# Patient Record
Sex: Male | Born: 1950 | Race: White | Hispanic: No | Marital: Single
Health system: Midwestern US, Community
[De-identification: ages and names within clinical notes are randomized; demographics above are authoritative.]

## PROBLEM LIST (undated history)

## (undated) DIAGNOSIS — K7460 Unspecified cirrhosis of liver: Secondary | ICD-10-CM

## (undated) DIAGNOSIS — K7031 Alcoholic cirrhosis of liver with ascites: Principal | ICD-10-CM

## (undated) DIAGNOSIS — K2960 Other gastritis without bleeding: Secondary | ICD-10-CM

## (undated) DIAGNOSIS — B182 Chronic viral hepatitis C: Secondary | ICD-10-CM

## (undated) DIAGNOSIS — K7030 Alcoholic cirrhosis of liver without ascites: Secondary | ICD-10-CM

## (undated) MED ORDER — MULTIVITAMIN CAP
ORAL_CAPSULE | Freq: Every day | ORAL | Status: DC
Start: ? — End: 2014-09-18

## (undated) MED ORDER — SPIRONOLACTONE 100 MG TAB
100 mg | ORAL_TABLET | Freq: Every day | ORAL | Status: DC
Start: ? — End: 2013-01-22

## (undated) MED ORDER — OFLOXACIN 400 MG TAB
400 mg | ORAL_TABLET | Freq: Two times a day (BID) | ORAL | Status: AC
Start: ? — End: 2013-02-16

## (undated) MED ORDER — SPIRONOLACTONE 100 MG TAB
100 mg | ORAL_TABLET | Freq: Every day | ORAL | Status: DC
Start: ? — End: 2014-05-04

## (undated) MED ORDER — LEVOFLOXACIN 750 MG TAB
750 mg | ORAL_TABLET | Freq: Every day | ORAL | Status: DC
Start: ? — End: 2013-01-19

## (undated) MED ORDER — OXYCODONE 10 MG TAB
10 mg | ORAL_TABLET | ORAL | Status: DC | PRN
Start: ? — End: 2013-03-11

## (undated) MED ORDER — FOLIC ACID 1 MG TAB
1 mg | ORAL_TABLET | Freq: Every day | ORAL | Status: DC
Start: ? — End: 2013-01-22

## (undated) MED ORDER — THIAMINE HCL 100 MG TAB
100 mg | ORAL_TABLET | Freq: Every day | ORAL | Status: DC
Start: ? — End: 2014-05-20

## (undated) MED ORDER — OXYCODONE 10 MG TAB
10 mg | ORAL_TABLET | Freq: Four times a day (QID) | ORAL | Status: DC | PRN
Start: ? — End: 2013-02-08

## (undated) MED ORDER — NADOLOL 20 MG TAB
20 mg | ORAL_TABLET | Freq: Every day | ORAL | Status: DC
Start: ? — End: 2013-01-22

## (undated) MED ORDER — OXYCODONE-ACETAMINOPHEN 5 MG-325 MG TAB
5-325 mg | ORAL_TABLET | Freq: Three times a day (TID) | ORAL | Status: DC | PRN
Start: ? — End: 2013-03-11

## (undated) MED ORDER — OXYCODONE-ACETAMINOPHEN 5 MG-325 MG TAB
5-325 mg | ORAL_TABLET | ORAL | Status: DC | PRN
Start: ? — End: 2013-03-11

## (undated) MED ORDER — DOCUSATE SODIUM 100 MG CAP
100 mg | ORAL_CAPSULE | Freq: Two times a day (BID) | ORAL | Status: AC
Start: ? — End: 2013-04-22

## (undated) MED ORDER — NADOLOL 20 MG TAB
20 mg | ORAL_TABLET | Freq: Every day | ORAL | Status: DC
Start: ? — End: 2014-05-04

## (undated) MED ORDER — OTHER
Status: DC
Start: ? — End: 2013-03-18

## (undated) MED ORDER — THIAMINE HCL 100 MG TAB
100 mg | ORAL_TABLET | Freq: Every day | ORAL | Status: DC
Start: ? — End: 2013-01-22

## (undated) MED ORDER — DOCUSATE SODIUM 100 MG CAP
100 mg | ORAL_CAPSULE | Freq: Two times a day (BID) | ORAL | Status: DC
Start: ? — End: 2013-01-22

## (undated) MED ORDER — FUROSEMIDE 40 MG TAB
40 mg | ORAL_TABLET | Freq: Every day | ORAL | Status: AC
Start: ? — End: 2013-02-28

## (undated) MED ORDER — OTHER
Status: DC
Start: ? — End: 2013-02-08

## (undated) MED ORDER — PANTOPRAZOLE 40 MG TAB, DELAYED RELEASE
40 mg | ORAL_TABLET | Freq: Every day | ORAL | Status: DC
Start: ? — End: 2014-05-04

## (undated) MED ORDER — TRAMADOL 50 MG TAB
50 mg | ORAL_TABLET | Freq: Four times a day (QID) | ORAL | Status: DC | PRN
Start: ? — End: 2012-11-08

## (undated) MED ORDER — ONDANSETRON HCL 4 MG TAB
4 mg | ORAL_TABLET | Freq: Three times a day (TID) | ORAL | Status: DC | PRN
Start: ? — End: 2013-03-25

## (undated) MED ORDER — FOLIC ACID 1 MG TAB
1 mg | ORAL_TABLET | Freq: Every day | ORAL | Status: DC
Start: ? — End: 2014-05-20

## (undated) MED ORDER — IBUPROFEN 800 MG TAB
800 mg | ORAL_TABLET | Freq: Three times a day (TID) | ORAL | Status: AC | PRN
Start: ? — End: 2012-12-26

## (undated) MED ORDER — MULTIVITAMIN CAP
ORAL_CAPSULE | Freq: Every day | ORAL | Status: DC
Start: ? — End: 2013-01-22

## (undated) MED ORDER — LEVOFLOXACIN 500 MG TAB
500 mg | ORAL_TABLET | Freq: Every day | ORAL | Status: AC
Start: ? — End: 2013-01-26

## (undated) MED ORDER — FERROUS SULFATE 325 MG (65 MG ELEMENTAL IRON) TAB
325 mg (65 mg iron) | ORAL_TABLET | Freq: Every day | ORAL | Status: DC
Start: ? — End: 2014-05-20

---

## 2004-10-22 NOTE — H&P (Signed)
[facility]                              HISTORY AND PHYSICAL                             Thomes Dinning, M.D.   NAME:    [name], [name]   MR #:    [id number]                    ADM DATE:        10/22/2004   BILLING  [id number]                   PT. LOCATION     ER  [id number]   #:   SS #     [id number]   Thomes Dinning, M.D.   cc:    Thomes Dinning, M.D.   CHIEF COMPLAINT:  Vomiting blood.   The informant is the patient.  He is a poor historian.  He has received   sedation.  The majority of the history is obtained from the ER personnel   and records.   HISTORY OF PRESENT ILLNESS:  This is a 54 year old white male who presents   to the hospital with a 24-hour history of dizziness.  On the day of   admission he vomited bright red blood.  There was a history of dark bowel   movements for 1 week and this was associated with some upper abdominal   pain.  The patient states to me that he drinks 6-12 beers a day.  There is   also a history of a fall off a ladder one week ago striking the left ribs   and hurting the left knee.  At this time he does not admit to any pain,   headache, shortness of breath, cough or abdominal pain.   REVIEW OF SYSTEMS:  Severely limited by the patient's lack of ability to   give a good history.   GENERAL:  No fevers, chills or weight change.   HEAD: No headache or lightheadedness.   RESPIRATORY:   No shortness of breath, cough or wheezing.   CHEST:  No chest pain, chest pressure, or palpitations.   GASTROINTESTINAL:   No nausea at this point, had no abdominal pain, denies   any known history of hepatic disease or ulcers.   GENITOURINARY:  No hematuria or change in urine color.   MUSCULOSKELETAL:   Left ribs hurt and stated something about his left knee   hurting, but will not elaborate.   NEUROLOGICAL:  No history of delirium tremens, no paresthesias, no   seizures.   No other history is obtainable from this patient.   PAST MEDICAL HISTORY:  Denied.    MEDICATIONS:  Denied.   ALLERGIES:  Denied.   SOCIAL HISTORY: States he does not smoke cigarettes, does drink alcohol as   stated above.   PHYSICAL EXAMINATION:   VITAL SIGNS:  Blood pressure 138/74 pulse 117, respirations 20, temperature   98.4, oxygen saturation 97%.   GENERAL:   He is well-developed, slightly overweight, no acute distress.   HEENT:   Head:  Normal hair pattern appreciated.  Eyes:  He does not appear   to be icteric.  Conjunctivae are not pale.  Pupils are 3 mm and react to   light.  Does not follow  for range of motion or fundus examination.  Ears:   Externally normal, hearing grossly normal.  Orally:  Tongue appears to be   normal.   NECK:  Supple, no masses, no jugular venous distention, no thyromegaly.   PULMONARY:  Lungs are clear.  The patient will cooperate and take deep   breaths.   CARDIOVASCULAR:  Tachycardia, S1, S2 within normal limits, no murmur, rub,   or gallop appreciated, no neck bruits.   ABDOMEN:  Some bowel sounds are present and appear to be normal, no pain to   palpation at this time.  Rectal done by ER personnel is strongly guaiac   positive melena.   MUSCULOSKELETAL:   No edema, no cyanosis, no significant deformities.   Unable to adequately evaluate the strength.   NEUROLOGICAL:  Not oriented, not able to give an adequate history.   Reflexes are 1+/4+, equal bilaterally, able to move all extremities.   INTEGUMENTARY:  No skin lesions except for some spider angioma on the   shoulders.   DIAGNOSTIC STUDIES: Sodium 136, potassium 4.1, chloride 100, CO2 24,   glucose 190, BUN 27, creatinine 0.9, SGOT 123, SGPT 120, alkaline   phosphatase 96, total bilirubin 1.3, calcium 8.6, total protein 6.7,   albumin 3, lipase is 24, white count is 12,100, hemoglobin is 12.6,   platelet count 114,000, segmented neutrophils 81, lymphs 11, monocytes 8.   INR is 1.3, alcohol negative.   IMPRESSION AND PLAN:      1. Upper gastrointestinal bleeding.  Gastroenterology has been       consulted.  The patient will be placed in ICU.  He is being resuscitated      with fluids, vitamins, magnesium.  He has been typed and crossed with 2      units of packed red blood cells and we will follow with serial      hemoglobin and hematocrit.  He has been started on Protonix drip and      octreotide for DT prophylaxis.  Critical care time includin eval,      review, orders; 41 minutes.      2. Elevated glucose - a new finding.  Check hemoglobin A1C, fasting      lipid panel.      3. Possible delirium tremens, will give prophylaxis.      4. Status post fall - left side of ribs chest x-ray.   Electronically Signed By:   Thomes Dinning, M.D. 11/03/2004 06:40   ____________________________   Thomes Dinning, M.D.   rew  D:  10/22/2004  T:  10/22/2004 11:00 P   [id number]

## 2004-10-22 NOTE — ED Provider Notes (Signed)
ADMISSION                           [facility]                      EMERGENCY DEPARTMENT TREATMENT REPORT   NAME:  RAYCE, BRAHMBHATT                   PT. LOCATION:     ER  [phone]   MR #:         BILLING #: [id number]          DOS: 10/22/2004   TIME: 7:43 P   [id number]   cc:   Primary Physician:   None.   CHIEF COMPLAINT:  Dizziness and vomiting blood.   HISTORY OF PRESENT ILLNESS:  This patient is a 54 year old male who drinks   fairly regularly.  He states for the last 24 hours he has been feeling   incredibly dizzy and lightheaded.  Today he began vomiting bright red   blood.  He also states he has been having dark bowel movements and some   upper abdominal pain for the last week.  He denies any history of GI   bleeds.  He drinks regularly, but is fairly unwilling to quantify it.  He   states it is not daily, and it sounds like he does binge drinking. He has   not used any drugs in over a year.  He did fall off a ladder a week ago.   He states at that time he did not hit his head nor did he lose   consciousness.  He has some bruising on his left lower ribs in the   midaxillary line and posteriorly from it.  He has no complaints of   shortness of breath or difficulty breathing. He does use aspirin and Motrin   intermittently for aches and pains and used more of this week since his   fall.   REVIEW OF SYSTEMS: CONSTITUTIONAL:  No fever, chills, weight loss.   ENDOCRINE:  Not a diabetic.   HEMATOLOGIC:  No excessive bruising.  RESPIRATORY:  No cough, shortness of   breath, or wheezing.   CARDIOVASCULAR:  No chest pain, chest pressure, or palpitations.   GASTROINTESTINAL:  See HPI.   GENITOURINARY:  No hematuria.   MUSCULOSKELETAL:  Left-sided rib pain since a fall a week ago.   SKIN:  Bruising over his ribcage.   NEUROLOGICAL:  No headache.  No sensory or motor symptoms.   Denies complaints in any other system.   PAST MEDICAL HISTORY:  Fairly regular alcohol use.   MEDICATIONS:  None.    ALLERGIES:  No known drug allergies.   SOCIAL HISTORY:  Denies tobacco abuse.  See HPI.   PHYSICAL EXAMINATION:   VITAL SIGNS:  Blood pressure 132/67, pulse 100, respiratory rate 20,   temperature 98.4.  Pulse oximetry is 100% on room air.   GENERAL APPEARANCE:  Somewhat disheveled-appearing male lying in the bed in   no acute distress.  He is awake, alert, and giving his history.   HEENT:  Eyes:  Conjunctivae clear, lids normal.  Pupils equal, symmetrical,   and normally reactive.  Conjunctivae is reasonably well-injected and   definitely not pale.  Mouth/Throat:  Surfaces of the pharynx, palate, and   tongue are pink, moist, and without lesions.   NECK:  Supple, nontender, symmetrical.  No cervical or  submandibular   lymphadenopathy palpated.   RESPIRATORY:  Clear and equal breath sounds.  No respiratory distress,   tachypnea, or accessory muscle use.   CARDIOVASCULAR:  Regular rate and rhythm.  Femoral pulses 2+ and equal   bilaterally.  No peripheral edema.  Calves are soft and nontender.  CHEST:   Tender to palpation over the inferior ribs on the left.  No palpable   crepitus.  There is an overlying bruise.   GI:  Abdomen soft, nontender, without complaint of pain to palpation.  No   hepatomegaly or splenomegaly.  No abdominal or inguinal masses appreciated   by inspection or palpation.   RECTAL:  Sphincter tone is normal.  No obvious hemorrhoids.  There is gross   melena on digital examination that is guaiac-positive.   SKIN:  Warm and dry.   NEUROLOGIC:  Alert and oriented x 3.  GCS (Glasgow Coma Score) is 15.   Motor and sensory examination normal and symmetrical throughout.   INITIAL ASSESSMENT AND MANAGEMENT PLAN:  This is a 54 year old male with   presents with obvious GI bleeding.  He has been vomiting blood.  He is   having melena and he is incredibly dizzy.  We will go ahead and check an   i-STAT to evaluate his hemoglobin as well as electrolytes.  We will place    large-bore IVs and begin hydrating him.  Will start Protonix and place an   NG tube to evaluate for ongoing upper GI bleeding.  In the meantime, we   will evaluate his liver functions, INR, and basic lab work.  There are no   records on him in our computer.   CONTINUATION BY DR. Kirt Boys MATHEWS:   DIAGNOSTIC STUDIES:  CBC was normal except for white count of 12,000,   hemoglobin 10.6, hematocrit 30%, platelets 114,000.  Comprehensive   Metabolic Panel was normal except for glucose of 190, BUN 27, SGOT 123,   SGPT 120, total bilirubin 1.3, albumin level 3.0.  INR was elevated at 1.3.   Alcohol level 0.  Lipase was normal.  The patient has been typed and   crossed for 2 units of packed red blood cells.   COURSE IN THE EMERGENCY DEPARTMENT:   The patient had 2 large-bore IVs   placed and has normal saline hanging.  He had an NG tube placed which had   essentially no output.  He was started on Protonix drip. He was given   Ativan IV prior to NG tube placement so that he would tolerate it better.   He is currently being hydrated with saline.   CONSULTATION:  I spoke with Dr. Marilynn Rail, he will consult on the patient.   I spoke with Dr. Bertram Gala, who will admit.   FINAL DIAGNOSES:   1.  Acute gastrointestinal bleeding.   2.  Dizziness secondary to #1.   3.  Hematemesis.   4.  Alcohol abuse.   5.  Hyperglycemia.   Critical care time, excluding procedures, but including direct patient   care, reviewing medical records, evaluating results of diagnostic testing,   discussions with family members, and consulting with physicians: 50   minutes.   DISPOSITION/PLAN: The patient is being admitted to the Intensive Care Unit   under the care of the hospitalist's service for further evaluation and   management.  He is currently in stable condition.   Electronically Signed By:   Stephan Minister, M.D. 11/08/2004 14:28   ____________________________   Stephan Minister, M.D.  [id number]  D:  10/22/2004  T:  10/22/2004  8:26 P   [id number]

## 2004-10-23 NOTE — Procedures (Signed)
[facility]                                 PROCEDURE NOTE                            [name], M.D.   [facility] [place], [place]   E:   MR  [id number]                         DATE:            10/23/2004   #:   [name]  [id number]                      PT. LOCATION:    [id number]   #   [name], M.D.   cc:    Thomes Dinning, M.D.          [name], M.D.   Extra copies to office:  0   PROCEDURE:   Esophagogastroduodenoscopy.   INDICATIONS FOR PROCEDURE:   Upper GI bleeding, nonsteroidal and aspirin use, possible cirrhosis.  ASA   score prior to the procedure is 2.   OPERATOR:   Dr. Marilynn Rail   MEDICATION:   Per anesthesia (MAC).   FINDINGS:  After informed consent was obtained from the patient, the   patient was placed in the left lateral decubitus position and premedicated   by anesthesia using Diprivan.  After the patient was adequately sedated, a   GIF-160 video endoscope was gently inserted into the upper esophagus under   direct vision.  The esophagus was endoscopically normal until the distal   aspect was reached. There were several small F1 varices noted in the distal   esophagus. The gastroesophageal junction was located at 40 cm from the   incisors.   I then passed the endoscope into the stomach where a probable Mallory-Weiss   tear was identified on the lesser curvature side of the gastric   cardia/fundus.  There was an overlying clot of this tear.  There was no   active bleeding from the site.  In the fundus, there were submucosal   hemorrhages, consistent with nonsteroidal fundic gastropathy.  The then   passed the endoscope into the distal stomach where the mucosa appeared   normal.  The gastric pylorus was traversed, and the endoscope was passed   into a duodenal bulb, which was angulated.  I was able to introduce the   endoscope into the descending duodenum, which appeared endoscopically    normal.  Slow withdrawal of the endoscope through the proximal duodenum   revealed no mucosal ulceration.    The endoscope was then slowly withdrawn   from the patient, and the patient appeared to tolerate the procedure well.   IMPRESSIONS:      1. Mallory-Weiss tear (fundus/cardia)- overlying clot with no active      bleeding.      2. F1 esophageal varices.      3. Nonsteroidal-induced gastropathy.   PLAN:      1. Continue Protonix drip.      2. Wean Octreotide.      3. We will discuss findings with the patient's hospitalist.   Electronically Signed By:   [name], M.D. 11/01/2004 09:58   _________________________________   [name], M.D.  ndt  D:  10/23/2004  T:  10/23/2004  1:53 P   [id number]

## 2004-10-23 NOTE — Consults (Signed)
[facility]                               CONSULTATION REPORT                     CONSULTANT:  Yisroel Ramming, M.D.   [facility], [facility]   BILLING #:  [id number]             DATE OF CONSULT:     10/23/2004   MR #:       [id number]              ADM DATE:            10/22/2004   SS #        [id number]           PT. LOCATION:        [id number]   ATTENDING:  Thomes Dinning, M.D.   cc:    Thomes Dinning, M.D.          Yisroel Ramming, M.D.   REASON FOR CONSULTATION:  GI bleeding.   IMPRESSION:      1. Upper gastrointestinal bleeding- differential diagnoses would include      bleeding from peptic ulcer disease, esophageal or gastric varices or      Mallory-Weiss tear.      2. Alcohol withdrawal (with concurrent agitation).      3. ? alcoholic cirrhosis (no physical stigmata, however).      4. Hematuria secondary to Foley trauma.      5. Chronic alcohol abuse history.   PLAN:      1. Continue supportive care.      2. Vitamin K.      3. Continue IV Protonix drip.      4. Continue IV octreotide 25 mcg/hr. There is an option to increase the      drip to 50 mcg/hr if the patient develops active GI bleeding.      5. Serial labs, including hemoglobin and hematocrit.      6. Avoid Tylenol.      7. EGD with anesthesia, MAC sedation.  This will be performed either      today or tomorrow.  The patient is currently not actively bleeding.   HISTORY OF PRESENT ILLNESS:  A 54 year old Caucasian male who presented to   the emergency room at [facility] for evaluation of   hematemesis.  According to the patient he was currently agitated and just   removed his Foley catheter resulting in marked hematuria.  The patient has   had possible melena for 1 week.  He denies a prior history of GI bleeding.   He does have risk factors for peptic ulcer disease (aspirin and Motrin   use).  He admits to drinking alcohol chronically and in large amounts.  He    may have binge drinking like behavior.  He denies a prior history of   hepatitis, jaundice, or family history of liver disease.  He has multiple   tattoos and does not recall being tested for hepatitis in the past.   I spoke to the ICU nurse this morning who was taking care of this patient.   The patient apparently removed his Foley catheter and this resulted in   marked hematuria.  He has had mild agitation.  Apparently, the patient's   stool overnight  had been brown.  NG tube has been draining tan fluid   without blood.  Laboratory data is significant for moderate   hypertransaminasemia and diminished synthetic function with an albumin of   2.9, and a cholesterol of 117.   PAST MEDICAL HISTORY:  None.   ALLERGIES:  No known drug allergies.   MEDICATIONS AT HOME:  None.   FAMILY HISTORY: There is no family history of liver disease, colon polyps,   or colon cancer.   SOCIAL HISTORY:  The patient smokes cigars and drinks alcohol heavily in a   binge pattern.   REVIEW OF SYSTEMS:  Notable for painful hematuria and limited upper GI   bleeding manifested by hematemesis.  He denies abdominal pain.  He also   denies dysphagia, odynophagia, or frequent heartburn.   PHYSICAL EXAMINATION:   GENERAL:  A middle-aged white male, mildly agitated.   VITAL SIGNS:  Blood pressure 133/64, temperature of 98.8 , pulse 100,   respirations 25.   SKIN:  Reveals no spider angiomata and multiple tattoos.   HEENT:  Anicteric sclerae, dry mucous membranes and absence of palpable   lymphadenopathy.   LUNGS:  Clear to auscultation anteriorly.   CARDIAC:  Regular rate and rhythm with no murmur.   ABDOMEN:  Soft, nontender, without palpable hepatosplenomegaly.  Bowel   sounds are active throughout.   EXTREMITIES:  Without edema or cyanosis.   The patient has blood oozing from the meatus of the penis from Foley   trauma.   NEUROLOGIC: The patient is oriented x2 and has an appropriate affect.    There is no excessive asterixis or tremor at this time.   PLAN:      1. As discussed above.  We will proceed with upper endoscopy to      determine source for bleeding.      2. Will also check to see if this patient may have chronic hepatitis B      or hepatitis C. This may be the contributor to his moderate      hypertransaminasemia.   Electronically Signed By:   Yisroel Ramming, M.D. 11/01/2004 09:58   _________________________________   Yisroel Ramming, M.D.   ndt  D:  10/23/2004  T:  10/23/2004  2:02 P   [id number]

## 2004-10-23 NOTE — Procedures (Signed)
[facility]                                 PROCEDURE NOTE                            Yisroel Ramming, M.D.   [facility] [place], [place]   E:   MR  [id number]                         DATE:            10/23/2004   #:   Lindley Magnus  [id number]                      PT. LOCATION:    [id number]   #   Yisroel Ramming, M.D.   cc:    Thomes Dinning, M.D.          Yisroel Ramming, M.D.   Extra copies to office:  0   PROCEDURE:   Esophagogastroduodenoscopy.   INDICATIONS FOR PROCEDURE:   Upper GI bleeding, nonsteroidal and aspirin use, possible cirrhosis.  ASA   score prior to the procedure is 2.   OPERATOR:   Dr. Marilynn Rail   MEDICATION:   Per anesthesia (MAC).   FINDINGS:  After informed consent was obtained from the patient, the   patient was placed in the left lateral decubitus position and premedicated   by anesthesia using Diprivan.  After the patient was adequately sedated, a   GIF-160 video endoscope was gently inserted into the upper esophagus under   direct vision.  The esophagus was endoscopically normal until the distal   aspect was reached. There were several small F1 varices noted in the distal   esophagus. The gastroesophageal junction was located at 40 cm from the   incisors.   I then passed the endoscope into the stomach where a probable Mallory-Weiss   tear was identified on the lesser curvature side of the gastric   cardia/fundus.  There was an overlying clot of this tear.  There was no   active bleeding from the site.  In the fundus, there were submucosal   hemorrhages, consistent with nonsteroidal fundic gastropathy.  The then   passed the endoscope into the distal stomach where the mucosa appeared   normal.  The gastric pylorus was traversed, and the endoscope was passed   into a duodenal bulb, which was angulated.  I was able to introduce the   endoscope into the descending duodenum, which appeared endoscopically   normal.  Slow withdrawal of the endoscope through the  proximal duodenum   revealed no mucosal ulceration.    The endoscope was then slowly withdrawn   from the patient, and the patient appeared to tolerate the procedure well.   IMPRESSIONS:      1. Mallory-Weiss tear (fundus/cardia)- overlying clot with no active      bleeding.      2. F1 esophageal varices.      3. Nonsteroidal-induced gastropathy.   PLAN:      1. Continue Protonix drip.      2. Wean Octreotide.      3. We will discuss findings with the patient's hospitalist.   Electronically Signed By:   Yisroel Ramming, M.D. 11/01/2004 09:58   _________________________________   Yisroel Ramming, M.D.  ndt  D:  10/23/2004  T:  10/23/2004  1:53 P   [id number]

## 2004-10-28 NOTE — Discharge Summary (Signed)
[facility]                                DISCHARGE SUMMARY   NAM [place], [place]   E:   MR  [id number]                          ADM DATE:      10/22/2004   #:   Lindley Magnus  [id number]                       DIS DATE:      10/28/2004   #   Acie Fredrickson. MILLER, M.D.   cc:   Yisroel Ramming, M.D.         Page Spiro, M.D.         Acie Fredrickson. MILLER, M.D.   DISCHARGE DIAGNOSES:      1. Upper gastrointestinal bleed secondary to Mallory-Weiss tear.      2. 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      3. Alcohol abuse.      4. Hepatitis-C-positive antibody with PCR pending.      5. Delirium tremens.      6. 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      7. 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   MEDICATIONS:  The patient has prescriptions for:      1. Ciprofloxacin 400 mg p.o. b.i.d. x 14 days.      2. Iron sulfate 325 mg p.o. b.i.d.      3. Ativan 1 mg p.o. t.i.d. for 2 days and then b.i.d. for 2 days and      then off.      4. Protonix 40 mg p.o. q.a.m.   HOSPITAL COURSE:  The patient is a 54 year old white male without known   significant past medical history who came in vomiting blood.  The patient   did indicated that he drank 6-12 beers per day along with Scotch and   possibly other substances.   The patient had an EGD which showed Mallory-Weiss tear and also some   esophageal and gastric varices were seen although without evidence of   bleeding occurring.  The patient was started on Protonix.  Hemoglobin was   monitored during the hospital stay and after initial drop from 10.6 to 6.8   followed by transfusion it stabilized and his hemoglobin was 9.9 at the   time of discharge, which was the highest it had been since the time of   transfusion.   The patient also had thrombocytopenia as low as 52,000 here.  This was up   to 68,000 prior to discharge and felt to be secondary to alcohol excess as   well as probable hypersplenism.    Hospital course was further complicated by the occurrence of delirium   tremens.  The patient has this well controlled on Ativan at the present   time, and this will be continued for a short taper after the time of   discharge.  The patient has been repeatedly warned of the need for him to   discontinue alcohol absolutely with no intake at all.  He voices clear   understanding of the need to do this.   The patient does have some hematuria.  He did have a Foley in during the   hospital stay and this probably the  main cause of it although he has had   problems with blood in his urine and semen before.  I discussed this with   Dr. Mathews Argyle, who felt the patient probably had prostatitis and recommended   a 2-week course of ciprofloxacin and to follow up with him in his office as   an outpatient, which I have also asked the patient to do and he indicated   that he will do so.   Other studies of note during the hospital stay:  The patient does have some   elevated transaminases likely secondary to alcohol.  His SGOT on last check   was 107, SGPT was 105.    Cholesterol was 117 with an HDL of 16,  LDL of   80. Lipase was normal at 651.  Ammonia is slightly elevated at 46 on the   last check.  Hepatitis B surface antigen was negative.  Hepatitis C   antibody was positive.  Hepatitis PCR is pending at the time of discharge   and this will be followed up by GI as an outpatient.   Total time for discharge was approximately 35 minutes.   Do not type below this line. Format codes may be deleted   Electronically Signed By:   Acie Fredrickson. MILLER, M.D. 11/01/2004 17:01   ________________________________   Acie Fredrickson. Hyacinth Meeker, M.D.   cd  D:  10/28/2004  T:  10/28/2004  8:25 P   [id number]

## 2008-11-04 LAB — URINALYSIS W/MICROSCOPIC
Bilirubin: NEGATIVE
Blood: NEGATIVE
Glucose: NEGATIVE MG/DL
Ketone: NEGATIVE MG/DL
Leukocyte Esterase: NEGATIVE
Nitrites: NEGATIVE
Protein: NEGATIVE MG/DL
Specific gravity: 1.015 (ref 1.003–1.030)
Urobilinogen: 0.2 EU/DL (ref 0.2–1.0)
pH (UA): 6 (ref 5.0–8.0)

## 2009-05-19 LAB — MAGNESIUM: Magnesium: 2 MG/DL (ref 1.8–2.4)

## 2009-05-21 LAB — POC CHEM8
Anion gap, POC: 15 (ref 10–20)
BUN, POC: 13 MG/DL (ref 7–18)
CO2, POC: 28 MMOL/L — ABNORMAL HIGH (ref 19–24)
Calcium, ionized (POC): 1.13 MMOL/L (ref 1.12–1.32)
Chloride, POC: 102 MMOL/L (ref 100–108)
Creatinine, POC: 0.8 MG/DL (ref 0.6–1.3)
GFRAA, POC: >60 mL/min/{1.73_m2} (ref >60)
GFRNA, POC: >60 mL/min/{1.73_m2} (ref >60)
Glucose, POC: 104 MG/DL (ref 74–106)
Hematocrit, POC: 46 % (ref 37.0–49.0)
Hemoglobin, POC: 15.6 G/DL (ref 13.0–16.0)
Potassium, POC: 4.2 MMOL/L (ref 3.5–5.5)
Sodium, POC: 140 MMOL/L (ref 136–145)

## 2010-02-10 NOTE — Procedures (Signed)
Test Reason : Chest pain   Blood Pressure : ***/*** mmHG   Vent. Rate : 084 BPM     Atrial Rate : 084 BPM      P-R Int : 120 ms          QRS Dur : 098 ms       QT Int : 368 ms       P-R-T Axes : 040 033 049 degrees      QTc Int : 434 ms   Normal sinus rhythm   Normal ECG   When compared with ECG of 10-Feb-2010 10:09,   No significant change was found   Confirmed by Robertson, M.D., Scott ([id number]) on 02/10/2010 12:29:56 PM   Referred By:             Overread By: Scott Robertson, M.D.

## 2010-02-10 NOTE — ED Provider Notes (Signed)
[facility]   EMERGENCY DEPARTMENT TREATMENT REPORT   NAME:  [name], [name]   SEX:   M   ADMIT: 02/10/2010   DOB:   1950/08/23   MR#    [id number]   ROOM:     TIME SEEN: 03 01 PM   ACCT#  [id number]               PRIMARY CARE PHYSICIAN:   Unknown       CHIEF COMPLAINT:   Chest pain.       HISTORY OF PRESENT ILLNESS:   This is a 59 year old male who comes in with exacerbation of chronic bilateral    arm pain beginning at both deltoids and moving into the lateral aspect of the    upper arm.  He also complains of pain in the left side of his chest.  He    states this pain all comes on together at the same time and he has had it for    the last year.  It seems to be worse with exertion of his arms or movement.     Does not cause any shortness of breath, it does not radiate beyond these    points.  It does last for greater than 20 minutes at a time.  He does not have    alleviating factors for this, but he states he uses alcohol to help with pain    and he has been drinking for the past 3 days to help with pain.  His    girlfriend brought him in to the Emergency Department today.  He states he did    not want to come in, however, she brought him in because in the middle of the    night he woke up with bilateral arm pain.  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  He denies any history of heart disease, although he does not see a    doctor on a regular basis, is unaware if he has any medical problems.  He has    a history of alcoholism as well as cigarette smoking and a family history of    cardiac disease.  He has not had any recent illnesses associated with this.     No documented fevers, no sweats, no chills, no coughing.       REVIEW OF SYSTEMS:   CONSTITUTIONAL:  No fever, chills, weight loss.    EYES: No visual symptoms.    ENT: No sore throat, runny nose or other URI symptoms.     RESPIRATORY:  No cough, shortness of breath, or wheezing.    CARDIOVASCULAR:  Left-sided anterior chest pain, pressure without    palpitations.   GASTROINTESTINAL:  No vomiting, diarrhea, or abdominal pain.    GENITOURINARY:  No dysuria, frequency, or urgency.    MUSCULOSKELETAL:  Bilateral chronic arm pain beginning in the deltoids,    descending to the upper arms.  No other muscle pain is noted.  This is chronic    over the last year.  No swelling or recent trauma.   INTEGUMENTARY:  No rashes.    NEUROLOGICAL:  No headaches, sensory or motor symptoms.         PAST MEDICAL HISTORY:   Negative.       MEDICATIONS:   None.       ALLERGIES:   NONE.  SOCIAL HISTORY:   He has a history of alcoholism and drinks every day.  He has been drinking for    the past 3 days to help with pain.  He denies smoking.  He denies illicit    drug use.       PHYSICAL EXAMINATION:   VITAL SIGNS:  Blood pressure 127/85, pulse 87, respirations 18, temperature    97.1, pain scale 0 out of 10, O2 99% on room air.   GENERAL:  This is a well-developed, well-nourished intoxicated 59 year old    male who is alert and oriented.  He does answer questions appropriately.     HEAD:  Normocephalic.  Eyes:  Conjunctivae clear, lids normal.  Pupils equal,    symmetrical, and normally reactive.  There is some icterus noted in the eyes.     Mouth/Throat:  Surfaces of the pharynx, palate, and tongue are pink, moist,    and without lesions.   Nasal mucosa, septum, and turbinates unremarkable.     Teeth and gums unremarkable.    NECK:  Supple, nontender, symmetrical, no masses or JVD, trachea midline.     Thyroid not enlarged, nodular, or tender.    LYMPHATICS: No cervical or submandibular lymphadenopathy palpated.    RESPIRATORY:  Clear and equal breath sounds.  No respiratory distress,    tachypnea, or accessory muscle use.    CARDIOVASCULAR:  Heart regular, without murmurs, gallops, rubs, or thrills.      PMI not displaced. DP pulses 2+ and equal bilaterally. Calves soft, nontender    without palpable cords or peripheral edema.    CHEST:  The chest is symmetric without masses, there is reproducible pain over    the left side of the chest with light palpation.   GI:  Abdomen soft, nontender, without complaint of pain to palpation.  No    hepatomegaly or splenomegaly. No abdominal or inguinal masses appreciated by    inspection or palpation.    MUSCULOSKELETAL:  Stance and gait appear normal.    SPINE:  There is no localized cervical, thoracic, lumbar or sacral bony    tenderness to palpation or fist percussion.  There are no bony step-offs,    ecchymoses, areas of soft tissue swelling or deformities.    BACK:  No cva tenderness.    Examination of upper extremities and shoulders reveals no asymmetry of the    shoulders.  There is no muscle atrophy noted.  He has strength equal on both    sides with grasping and sensation is intact to light touch.  I cannot    reproduce his pain by palpation of the arms.   SKIN:  Warm and dry without rashes.      NEUROLOGICAL:  Nonfocal.     PSYCHIATRIC:  Judgment appears appropriate.        INITIAL ASSESSMENT:   This is a well-developed, well-nourished 59 year old male who comes in    intoxicated with arm and chest pain.  This appears to be musculoskeletal in    nature as it is very reproducible and has been chronic over the last year.  He    states last year he had a stress test was negative but he does not remember    where.  We will go ahead and check abdominal labs on him for his icterus and    history of alcoholism.  We will check coags, cardiac enzymes, a BAC.  We will    obtain a chest x-ray and EKG.  DIAGNOSTICS:   A 12-lead EKG is read by Stephan Minister as normal sinus rhythm without acute    pathology, ventricular rate of 84 beats per minute.  PTT is elevated at 37.7,    PT 15, INR 1.2, troponin is less than 0.015.  CPK is 458, CPK MB 8.9,     relative index is normal at 1.9, myoglobin 144, blood alcohol content is 283.     CMP reveals a blood calcium of 8, SGOT 169, SGPT 143, total bilirubin is 1.1    and total protein is 8.4.  Lipase is normal at 319.  CBC is remarkable for an    MCV of 102.6, MCHB 36 and his platelets are 58.  Chest x-ray is read as clear    lung fields with normal heart size read by radiologist.       COURSE IN EMERGENCY DEPARTMENT:   We explained the diagnostic findings with the patient.  I also reviewed the    laboratory findings with his prior hospitalization in our facility and note    that the platelet count is actually better than it was when he was here in    01/20/2010, his hemoglobin and  hematocrit remained stable.  He has had mild    elevation in liver enzymes in the past as he does today.  His labs are    essentially stable and unchanged if not improved.  We discussed the diagnostic    findings with the patient.  We feel that this is probably not cardiac related    in nature as this is very reproducible.  He has prior rotator cuff injuries    that he tells [place] about and this is exacerbated today from its chronic state.     Therefore, we will discharge him home in a stable condition strongly    encouraging him to stop drinking and using alcohol to treat his pain.  We also    encouraged him to follow up with a primary care physician because he will    need further management of this chronic pain.  The patient is given IV    morphine while he is here and he has significant improvement in pain.       FINAL DIAGNOSES:      1.  Arm pain.   2.  Chest pain, anterior chest wall.   3.  Acute alcohol abuse.        DISPOSITION:   We will discharge the patient home in a stable condition.  He is given a    prescription of Vicodin he is to use for pain.  We advised him we will not    refill further prescriptions of narcotics for chronic pain and that he should    follow up with a primary care physician for further evaluation and  management    of this chronic problem.  He is referred to Dr. Bethanne Ginger on call for    outpatient primary care followup and is encouraged to return should he develop    any shortness of breath, worsening chest pain.  The patient was personally    evaluated by myself and Dr. Jama Flavors who agrees with the above assessment and    plan.           ___________________   Imogene Burn M.D.   Dictated By: Ethlyn Daniels Pryor Montes, PA-C   jh   D:02/10/2010   T: 02/10/2010 16:12:40   [id number]

## 2010-02-10 NOTE — ED Provider Notes (Signed)
KNOWN ALLERGIES   NKDA       TRIAGE   TRIAGE NOTES:  chest pain and bilateral arms pain comes and goes         for 1 year.   PATIENT: NAME: [name], AGE: 59, GENDER: male, DOB:         Wed 01-Dec-1950, TIME OF GREET: Wed Feb 10, 2010 10:56, SSN:         [id number], KG WEIGHT: 104.3 (est.), MEDICAL RECORD NUMBER: [id number],         ACCOUNT NUMBER: [id number], PCP: Pt Denies,.   ADMISSION: URGENCY: 2, DEPT: Emergency, BED: WAITING.   VITAL SIGNS: BP 133/92, (Sitting), Pulse 87, Resp 20, Temp 97.1,         (Oral), Pain 0, O2 Sat 96, on Room air, Time 02/10/2010 10:56.   COMPLAINT:  Cp.   PRESENTING COMPLAINT:  chest pain but mostly bilateril arm pain.   TB SCREENING: TB screen negative for this patient.   ABUSE SCREENING: Patient denies physical abuse or threats.   FALL RISK: Patient has a low risk of falling.   SUICIDAL IDEATION: Suicidal ideation is not present.   ADVANCE DIRECTIVES: Patient does not have advance directives,         Triage assessment performed.   PROVIDERS: TRIAGE NURSE: Annamary Carolin, RN.       CURRENT MEDICATIONS   Patient not taking meds       MEDICATION SERVICE   Morphine Sulfate:  Order: Morphine Sulfate - Dose: 8 mg         : IV         Ordered by: Angelena Sole, MD         Entered by: Angelena Sole, MD Wed Feb 10, 2010 12:50 ,          Acknowledged by: Franki Monte, RN Wed Feb 10, 2010 13:01         Documented as given by: Franki Monte, RN Wed Feb 10, 2010 13:13          Patient, Medication, Dose, Route and Time verified prior to         administration.          Time given: 1315, Amount given: 8mg , IVP, Initial medication,         Slowly, Catheter placement confirmed via flush prior to         administration, IV site without signs or symptoms of infiltration         during medication administration, No swelling during administration,         No drainage during administration, IV flushed after administration,         Correct patient, time, route, dose and medication confirmed prior to          administration, Patient advised of actions and side-effects prior to         administration, Allergies confirmed and medications reviewed prior to         administration.    : Follow Up :  Decreased symptoms, Decreased mental status.    : Follow Up :  Decreased pain, Decreased symptoms, Decreased         mental status.   Morphine Sulfate:  Order: Morphine Sulfate - Dose: 4 mg         : IV         Ordered by: Belva Agee, PA-C         Entered by: Belva Agee, PA-C Wed Feb 10, 2010 14:46 ,          Acknowledged by: Franki Monte, RN Wed Feb 10, 2010 14:47         Documented as given by: Franki Monte, RN Wed Feb 10, 2010 14:53          Patient, Medication, Dose, Route and Time verified prior to         administration.          Time given: 1455, Amount given: 4mg , IVP, Initial medication,         Slowly, Catheter placement confirmed via flush prior to         administration, IV site without signs or symptoms of infiltration         during medication administration, No swelling during administration,         No drainage during administration, IV flushed after administration,         Correct patient, time, route, dose and medication confirmed prior to         administration, Patient advised of actions and side-effects prior to         administration, Allergies confirmed and medications reviewed prior to         administration.   Nitroglycerin:  Order: Nitroglycerin - Dose: adm 1 SL,         repeat Q77min up to 3 total tab(s) : Sublingual         Ordered by: Angelena Sole, MD         Entered by: Franki Monte, RN Wed Feb 10, 2010 13:52          Documented as given by: Franki Monte, RN Wed Feb 10, 2010 13:52          Patient, Medication, Dose, Route and Time verified prior to         administration.          Time given: 1355, Amount given: 0.4mg , Site: Medication administered         S.L.    : Follow Up :  No change in symptoms.   Zofran:  Order: Zofran (Ondansetron Hydrochloride) -          Dose: 4 mg : IV         Ordered by: Angelena Sole, MD         Entered by: Angelena Sole, MD Wed Feb 10, 2010 12:50 ,          Acknowledged by: Franki Monte, RN Wed Feb 10, 2010 13:01         Documented as given by: Franki Monte, RN Wed Feb 10, 2010 13:14          Patient, Medication, Dose, Route and Time verified prior to         administration.          Time given: 1315, Amount given: 4mg , IVP, Initial medication,         Slowly, Catheter placement confirmed via flush prior to         administration, IV site without signs or symptoms of infiltration         during medication administration, No swelling during administration,         No drainage during administration, IV flushed after administration,         Correct patient, time, route, dose and medication confirmed prior to         administration, Patient advised of actions and side-effects prior to  administration, Allergies confirmed and medications reviewed prior to         administration.       INSTRUCTION   DISCHARGE:  ROTATOR CUFF INJURY (SHOULDER TEAR).   FOLLOWUP:  [facility], , MEDICINE, Rayetta Humphrey,         MEDICINE, [address] [place] [address], [phone].   SPECIAL:  Follow up with primary care physician as we discussed         for further evaluation and management of arm pain assoc with rotater         cuff injury.           Stop using alcohol.          Ice to areas of soreness intermittently.         For further information, www.medlineplus.gov or         www.emedicinehealth.com         Return to the ER if condition worsens or new symptoms develop.         Do not drive or operate machinery while medicated.   Key:     [email or web address]=Rodgers, RN, Iantha Fallen  LDD0=David, RN, Lyn  NAK=Kushner, PA-C,     Nichole     RLM1=Manolio, MD, Raiford Noble

## 2010-02-10 NOTE — Procedures (Signed)
Test Reason : Chest pain   Blood Pressure : ***/*** mmHG   Vent. Rate : 084 BPM     Atrial Rate : 084 BPM      P-R Int : 120 ms          QRS Dur : 098 ms       QT Int : 368 ms       P-R-T Axes : 040 033 049 degrees      QTc Int : 434 ms   Normal sinus rhythm   Normal ECG   When compared with ECG of 10-Feb-2010 10:09,   No significant change was found   Confirmed by Merilynn Finland, M.D., Scott ([id number]) on 02/10/2010 12:29:56 PM   Referred By:             Overread By: Marcello Moores, M.D.

## 2010-04-27 LAB — D DIMER: D DIMER: 3.13 ug/ml(FEU) — ABNORMAL HIGH (ref <0.46)

## 2010-04-27 LAB — CBC WITH AUTOMATED DIFF
ABS. BASOPHILS: 0 10*3/uL (ref 0.0–0.06)
ABS. EOSINOPHILS: 0.2 10*3/uL (ref 0.0–0.4)
ABS. LYMPHOCYTES: 0.7 10*3/uL — ABNORMAL LOW (ref 0.8–3.5)
ABS. MONOCYTES: 0.8 10*3/uL (ref 0–1.0)
ABS. NEUTROPHILS: 2.9 10*3/uL (ref 1.8–8.0)
BASOPHILS: 1 % (ref 0–3)
EOSINOPHILS: 5 % (ref 0–5)
HCT: 45.2 % (ref 36.0–48.0)
HGB: 15.5 g/dL (ref 13.0–16.0)
LYMPHOCYTES: 16 % — ABNORMAL LOW (ref 20–51)
MCH: 33.8 PG (ref 24.0–34.0)
MCHC: 34.3 g/dL (ref 31.0–37.0)
MCV: 98.5 FL — ABNORMAL HIGH (ref 74.0–97.0)
MONOCYTES: 18 % — ABNORMAL HIGH (ref 2–9)
MPV: 10.8 FL (ref 9.2–11.8)
NEUTROPHILS: 60 % (ref 42–75)
PLATELET COMMENTS: DECREASED
PLATELET: 49 10*3/uL — ABNORMAL LOW (ref 135–420)
RBC: 4.59 M/uL — ABNORMAL LOW (ref 4.70–5.50)
RDW: 12.9 % (ref 11.6–14.5)
WBC: 4.6 10*3/uL (ref 4.6–13.2)

## 2010-04-27 LAB — D-DIMER, QUANTITATIVE: D-Dimer, Quant: 3.13 ug/ml(FEU) — ABNORMAL HIGH (ref <0.46)

## 2010-05-11 LAB — CKMB PROFILE
CK - MB: 7.1 ng/ml — ABNORMAL HIGH (ref 0.5–3.6)
CK-MB Index: 3.1 % (ref 0.0–4.0)
CK: 230 U/L (ref 39–308)

## 2010-05-11 LAB — CBC WITH AUTOMATED DIFF
ABS. BASOPHILS: 0 10*3/uL (ref 0.0–0.06)
ABS. EOSINOPHILS: 0.3 10*3/uL (ref 0.0–0.4)
ABS. LYMPHOCYTES: 1.1 10*3/uL (ref 0.8–3.5)
ABS. MONOCYTES: 1 10*3/uL (ref 0–1.0)
ABS. NEUTROPHILS: 4.3 10*3/uL (ref 1.8–8.0)
BASOPHILS: 0 % (ref 0–3)
EOSINOPHILS: 4 % (ref 0–5)
HCT: 32.2 % — ABNORMAL LOW (ref 36.0–48.0)
HGB: 11.2 g/dL — ABNORMAL LOW (ref 13.0–16.0)
LYMPHOCYTES: 17 % — ABNORMAL LOW (ref 20–51)
MCH: 34 PG (ref 24.0–34.0)
MCHC: 34.8 g/dL (ref 31.0–37.0)
MCV: 97.9 FL — ABNORMAL HIGH (ref 74.0–97.0)
MONOCYTES: 15 % — ABNORMAL HIGH (ref 2–9)
MPV: 11.1 FL (ref 9.2–11.8)
NEUTROPHILS: 64 % (ref 42–75)
PLATELET COMMENTS: DECREASED
PLATELET: 71 10*3/uL — ABNORMAL LOW (ref 135–420)
RBC: 3.29 M/uL — ABNORMAL LOW (ref 4.70–5.50)
RDW: 12.9 % (ref 11.6–14.5)
WBC: 6.7 10*3/uL (ref 4.6–13.2)

## 2010-05-11 LAB — URINALYSIS W/ RFLX MICROSCOPIC
Bilirubin: NEGATIVE
Blood: NEGATIVE
Glucose: NEGATIVE MG/DL
Ketone: NEGATIVE MG/DL
Leukocyte Esterase: NEGATIVE
Nitrites: NEGATIVE
Protein: NEGATIVE MG/DL
Specific gravity: >1.03 — ABNORMAL HIGH (ref 1.003–1.030)
Urobilinogen: 0.2 EU/DL (ref 0.2–1.0)
pH (UA): 5.5 (ref 5.0–8.0)

## 2010-05-11 LAB — METABOLIC PANEL, COMPREHENSIVE
A-G Ratio: 0.9 (ref 0.8–1.7)
ALT (SGPT): 131 U/L — ABNORMAL HIGH (ref 30–65)
AST (SGOT): 129 U/L — ABNORMAL HIGH (ref 15–37)
Albumin: 3.3 g/dL — ABNORMAL LOW (ref 3.4–5.0)
Alk. phosphatase: 100 U/L (ref 50–136)
Anion gap: 5 mmol/L (ref 5–15)
BUN/Creatinine ratio: 19 (ref 12–20)
BUN: 23 MG/DL — ABNORMAL HIGH (ref 7–18)
Bilirubin, total: 1.3 MG/DL — ABNORMAL HIGH (ref 0.2–1.0)
CO2: 30 MMOL/L (ref 21–32)
Calcium: 8.2 MG/DL — ABNORMAL LOW (ref 8.4–10.4)
Chloride: 99 MMOL/L — ABNORMAL LOW (ref 100–108)
Creatinine: 1.2 MG/DL (ref 0.6–1.3)
GFR est AA: >60 mL/min/{1.73_m2} (ref >60)
GFR est non-AA: >60 mL/min/{1.73_m2} (ref >60)
Globulin: 3.6 g/dL (ref 2.0–4.0)
Glucose: 214 MG/DL — ABNORMAL HIGH (ref 74–99)
Potassium: 4 MMOL/L (ref 3.5–5.5)
Protein, total: 6.9 g/dL (ref 6.4–8.2)
Sodium: 134 MMOL/L — ABNORMAL LOW (ref 136–145)

## 2010-05-11 LAB — PTT: aPTT: 29.8 s (ref 24.6–37.7)

## 2010-05-11 LAB — PROTHROMBIN TIME + INR
INR: 1.2 (ref 0.0–1.2)
Prothrombin time: 14.4 s (ref 11.5–15.2)

## 2010-05-11 LAB — TROPONIN I: Troponin-I, QT: <0.04 NG/ML (ref 0.00–0.08)

## 2010-05-25 NOTE — ED Provider Notes (Signed)
KNOWN ALLERGIES   NKDA       TRIAGE (Tue May 25, 2010 17:56 LDD0)   PATIENT: NAME: [name], AGE: 60, GENDER: male, DOB:         Wed [birth year], TIME OF GREET: Tue May 25, 2010 17:49, LANGUAGE:         [place], [place]: [id number], MEDICAL RECORD NUMBER: [id number], ACCOUNT         NUMBER: [id number], PCP: Pt Denies,. (Tue May 25, 2010 17:56         LDD0)   ADMISSION: URGENCY: 3, DEPT: Emergency, BED: WAITING. (Tue May 25, 2010 17:56 LDD0)   VITAL SIGNS: BP 131/72, (Sitting), Pulse 76, Resp 20, Temp 97.7,         (Oral), Pain 10, O2 Sat 99, on Room air, Time 05/25/2010 17:53. (17:53         LDD0)   COMPLAINT:  Leg Swollen/Pain. (Tue May 25, 2010 17:56 LDD0)   PRESENTING COMPLAINT:  left leg swelling and pain. (18:55         AC)   PAIN: Patient complains of pain, Pain described as sharp, Pain         described as shooting, Pain described as stabbing, On a scale 0-10         patient rates pain as 10, left leg, Pain is constant, Onset was 3         weeks. (18:55 AC)   TB SCREENING: TB screen negative for this patient. (18:55         AC)   ABUSE SCREENING: Patient denies physical abuse or threats. (18:55         AC)   FALL RISK: Patient has a high risk of falling, Patient has a         history of falling (25), No secondary diagnosis (0),         Crutches/cane/walker (15), IV or IV Access (20), Impaired (20),         Oriented to own ability (0), Total 80. (18:55 AC)   SUICIDAL IDEATION: Suicidal ideation is not present. (18:55         AC)   ADVANCE DIRECTIVES: Patient does not have advance directives,         Triage assessment performed. (18:55 AC)   PROVIDERS: TRIAGE NURSE: Annamary Carolin, RN. Halford Decamp May 25, 2010 17:56         LDD0)   PREVIOUS VISIT ALLERGIES: Nkda. Halford Decamp May 25, 2010 17:56         LDD0)       CURRENT MEDICATIONS (17:57 LDD0)   Multivitamin:  once a day.   Iron (Ferrous Sulfate):  once a day.       MEDICATION SERVICE (19:15 NJB)   Dilaudid:  Order: Dilaudid (Hydromorphone Hydrochloride) -          Dose: 1 mg : IV         Ordered by: Henderson Hector, PA-C         Entered by: Henderson [place], PA-C Tue May 25, 2010 19:08 ,          Acknowledged by: Leane Platt, RN Tue May 25, 2010 19:09         Documented as given by: Leane Platt, RN Tue May 25, 2010 19:15          Patient, Medication, Dose, Route and Time verified prior to         administration.  Time given: 1912, Amount given: 1mg , IVP, Initial medication,         Slowly, Catheter placement confirmed via flush prior to         administration, IV site without signs or symptoms of infiltration         during medication administration, No swelling during administration,         No drainage during administration, IV flushed after administration,         Correct patient, time, route, dose and medication confirmed prior to         administration, Patient advised of actions and side-effects prior to         administration, Allergies confirmed and medications reviewed prior to         administration, Patient in position of comfort, Side rails up, Cart         in lowest position, Family at bedside, Call light in reach.       ORDERS   VEN-LE VENOUS DUPLEX UNILAT:  Ordered for: [name]         Status: Active. (19:02 NJB)   KNEE COMPLETE:  Ordered for: [name]         Status: Active. (19:02 NJB)   PVL has been ordered:  Ordered for: [name]         Status: Done by Suzette Battiest May 25, 2010 19:12. (19:02         NJB)   Elita Boone IV Cath:  Ordered for: [name]         Status: Active. (21:26 TRF0)       NURSING ASSESSMENT: EXTREMITY LOWER (18:47 AC)   NURSING DIAGNOSIS: Notes: pt was seen at [facility] and was         prescribed percocet and states that 2 tabs would help. was told to go         to his pcp. pts pcp couldnt get pt in for 4 weeks.   CONSTITUTIONAL: History obtained from patient, Patient arrives         ambulatory, Gait steady, Patient appears comfortable, Patient          cooperative, Patient alert, Oriented to person, place and time, Skin         warm, Skin dry, Skin normal in color, Mucous membranes pink, Mucous         membranes moist, Patient is well-groomed.   PAIN: sharp pain, shooting pain, stabbing pain, pain startes int         he bottom of pts thigh and and radiates down to his calf and top of         his foot., Onset of pain 3 weeks, on a scale 0-10 patient rates pain         as 10.   LEFT LOWER EXTREMITY: Left lower extremity assessment findings         include capillary refill less than 2 seconds, Skin color normal, Skin         temperature warm, Distal sensation intact, Muscle tone normal, Notes:         pt denies injury to left leg. pt denies fevers. denies cp and sob. pt         states he had cellulitis in his left leg in the past.   SAFETY: Side rails up, Cart/Stretcher in lowest position, Family         at bedside, Call light within reach,  Hospital ID band on.       NURSING PROCEDURE: DISCHARGE NOTE (21:24 TRF0)   DISCHARGE: Patient discharged to home, in a wheelchair, family         driving, accompanied by husband/wife/partner, Patient requested and         was provided an electronic copy of Discharge Instructions, Simple or         moderate discharge teaching performed, by T. FITZ, RN, Prescriptions         given and instructions on side effects given, Name of prescription(s)         given: VICOPROFEN, Above person(s) verbalized understanding of         discharge instructions and follow-up care.       NURSING PROCEDURE: NURSE NOTES   NURSES NOTES: Notes: ASSUMED CARE, PVL AT BEDSIDE. (19:20         TRF0)     Notes: PT REQUESTING PAIN MEDS, DR LAMM AWARE AND AT BEDSIDE. (20:45         TRF0)       DIAGNOSIS (21:01 NJB)   FINAL: PRIMARY: Acute LLE pain (knee/thigh).       DISPOSITION   PATIENT:  Disposition Type: Discharged, Disposition: Discharged,         Disposition Transport: Family/Friend drive, Condition: Stable. (21:01         NJB)       IV Infusion: N/A, Patient left the department. (21:28 TRF0)       INSTRUCTION (21:00 NJB)   DISCHARGE:  LE EDEMA ETIOLOGY UNKNOWN - DUPLEX US PERFORMED (LEG         SWELLING, DEPENDENT EDEMA), DEGENERATIVE JOINT DISEASE (DJD, OA,         OSTEOARTHRITIS, ARTHRITIS).   FOLLOWUPMikeal Hawthorne, GMD, [address],         [place] [address], [phone].   SPECIAL:  Vicoprofen as needed for relief of pain - do not:         drive, drink alcohol, operate machinery, take NSAIDS or any other         pain medication with this medication.         You MUST follow up with your physician for further pain management         and evaluation of your leg/knee pain. Call tomorrow to obtain         appointment with new doctor or to attempt to move up appointment with         your current doctor.         Read and understand discharge instructions prior to leaving ER.         Follow these in regards to care and return for those reasons as         detailed.         Return to the ER if condition worsens or new symptoms develop.   Key:     AC=Conley, RN, Marchelle Folks  LDD0=David, RN, Lyn  NJB=Brosky, PA-C, Weston Brass     TRF0=Fitz, (TY), RN, Fabienne Bruns

## 2010-05-25 NOTE — ED Provider Notes (Signed)
KNOWN ALLERGIES   NKDA       TRIAGE   PATIENT: NAME: [name], AGE: 60, GENDER: male, DOB:         Wed [birth year], TIME OF GREET: Tue May 25, 2010 17:49, LANGUAGE:         [place], [place]: [id number], MEDICAL RECORD NUMBER: [id number], ACCOUNT         NUMBER: [id number], PCP: Pt Denies,.   ADMISSION: URGENCY: 3, DEPT: Emergency, BED: WAITING.   VITAL SIGNS: BP 131/72, (Sitting), Pulse 76, Resp 20, Temp 97.7,         (Oral), Pain 10, O2 Sat 99, on Room air, Time 05/25/2010 17:53.   COMPLAINT:  Leg Swollen/Pain.   PRESENTING COMPLAINT:  left leg swelling and pain.   PAIN: Patient complains of pain, Pain described as sharp, Pain         described as shooting, Pain described as stabbing, On a scale 0-10         patient rates pain as 10, left leg, Pain is constant, Onset was 3         weeks.   TB SCREENING: TB screen negative for this patient.   ABUSE SCREENING: Patient denies physical abuse or threats.   FALL RISK: Patient has a high risk of falling, Patient has a         history of falling (25), No secondary diagnosis (0),         Crutches/cane/walker (15), IV or IV Access (20), Impaired (20),         Oriented to own ability (0), Total 80.   SUICIDAL IDEATION: Suicidal ideation is not present.   ADVANCE DIRECTIVES: Patient does not have advance directives,         Triage assessment performed.   PROVIDERS: TRIAGE NURSE: Annamary Carolin, RN.   PREVIOUS VISIT ALLERGIES: Nkda.       CURRENT MEDICATIONS   Multivitamin:  once a day.   Iron (Ferrous Sulfate):  once a day.       MEDICATION SERVICE   Dilaudid:  Order: Dilaudid (Hydromorphone Hydrochloride) -         Dose: 1 mg : IV         Ordered by: Henderson [place], PA-C         Entered by: Henderson , PA-C Tue May 25, 2010 19:08 ,          Acknowledged by: Leane Platt, RN Tue May 25, 2010 19:09         Documented as given by: Leane Platt, RN Tue May 25, 2010 19:15          Patient, Medication, Dose, Route and Time verified prior to         administration.           Time given: 1912, Amount given: 1mg , IVP, Initial medication,         Slowly, Catheter placement confirmed via flush prior to         administration, IV site without signs or symptoms of infiltration         during medication administration, No swelling during administration,         No drainage during administration, IV flushed after administration,         Correct patient, time, route, dose and medication confirmed prior to         administration, Patient advised of actions and side-effects prior to         administration, Allergies confirmed  and medications reviewed prior to         administration, Patient in position of comfort, Side rails up, Cart         in lowest position, Family at bedside, Call light in reach.       NURSING PROCEDURE: DISCHARGE NOTE   DISCHARGE: Patient discharged to home, in a wheelchair, family         driving, accompanied by husband/wife/partner, Patient requested and         was provided an electronic copy of Discharge Instructions, Simple or         moderate discharge teaching performed, by T. FITZ, RN, Prescriptions         given and instructions on side effects given, Name of prescription(s)         given: VICOPROFEN, Above person(s) verbalized understanding of         discharge instructions and follow-up care.       NURSING PROCEDURE: NURSE NOTES   NURSES NOTES: Notes: ASSUMED CARE, PVL AT BEDSIDE.     Notes: PT REQUESTING PAIN MEDS, DR LAMM AWARE AND AT BEDSIDE.       INSTRUCTION   DISCHARGE:  LE EDEMA ETIOLOGY UNKNOWN - DUPLEX US PERFORMED (LEG         SWELLING, DEPENDENT EDEMA), DEGENERATIVE JOINT DISEASE (DJD, OA,         OSTEOARTHRITIS, ARTHRITIS).   FOLLOWUPMikeal Hawthorne, GMD, [address],         [place] [address], [phone].   SPECIAL:  Vicoprofen as needed for relief of pain - do not:         drive, drink alcohol, operate machinery, take NSAIDS or any other         pain medication with this medication.          You MUST follow up with your physician for further pain management         and evaluation of your leg/knee pain. Call tomorrow to obtain         appointment with new doctor or to attempt to move up appointment with         your current doctor.         Read and understand discharge instructions prior to leaving ER.         Follow these in regards to care and return for those reasons as         detailed.         Return to the ER if condition worsens or new symptoms develop.   Key:     AC=Conley, RN, Marchelle Folks  LDD0=David, RN, Lyn  NJB=Brosky, PA-C, Weston Brass     TRF0=Fitz, (TY), RN, Fabienne Bruns
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
## 2010-05-31 NOTE — ED Provider Notes (Signed)
KNOWN ALLERGIES   NKDA       TRIAGE (Mon May 31, 2010 15:32 LDD0)   PATIENT: NAME: [name], AGE: 60, GENDER: male, DOB:         Wed [birth year], TIME OF GREET: Mon May 31, 2010 15:26, LANGUAGE:         [place], [place]: [id number], MEDICAL RECORD NUMBER: [id number], ACCOUNT         NUMBER: [id number], PCP: Doctor Rene Paci,. Sheral Flow May 31, 2010 15:32         LDD0)   ADMISSION: URGENCY: 4, DEPT: Emergency, BED: WAITING. (Mon May 31, 2010 15:32 LDD0)   VITAL SIGNS: BP 114/76, (Sitting), Pulse 73, Resp 20, Temp 96,         (Oral), Pain 10, O2 Sat 96, on Room air, Time 05/31/2010 15:29. (15:29         LDD0)   COMPLAINT:  F/U, Lt Leg Pain. Sheral Flow May 31, 2010 15:32 LDD0)   PRESENTING COMPLAINT:  it will be two weeks before he can se his         doctor. needs something for pain to last until then. (15:42         KSR0)   TB SCREENING: TB screen negative for this patient. (15:42         KSR0)   ABUSE SCREENING: Patient denies physical abuse or threats. (15:42         KSR0)   FALL RISK: Patient has a low risk of falling. (15:42 KSR0)   SUICIDAL IDEATION: Suicidal ideation is not present. (15:42         KSR0)   ADVANCE DIRECTIVES: Patient does not have advance directives,         Triage assessment performed. (15:42 KSR0)   PROVIDERS: TRIAGE NURSE: Annamary Carolin, RN. Sheral Flow May 31, 2010 15:32         LDD0)   PREVIOUS VISIT ALLERGIES: Nkda. Sheral Flow May 31, 2010 15:32         LDD0)       CURRENT MEDICATIONS (15:33 LDD0)   Iron (Ferrous Sulfate):  once a day.   Multivitamin:  once a day.       NURSING ASSESSMENT: EXTREMITY LOWER (15:47 KSR0)   CONSTITUTIONAL: History obtained from patient, Patient arrives,         via hospital wheelchair, Gait steady, Patient appears comfortable,         Patient cooperative, Patient alert, Oriented to person, place and         time, Skin warm, Skin dry, Skin normal in color.   LEFT LOWER EXTREMITY: Skin color normal, Muscle tone normal.   RIGHT LOWER EXTREMITY: Skin color normal, Muscle tone normal.        NURSING PROCEDURE: DISCHARGE NOTE (16:29 KSR0)   DISCHARGE: Patient discharged to home, ambulating without         assistance, family driving, accompanied by other family member,         Discharge instructions given to patient, Simple or moderate discharge         teaching performed, Above person(s) verbalized understanding of         discharge instructions and follow-up care.       DIAGNOSIS (16:22 EI)   FINAL: PRIMARY: left knee pain.       DISPOSITION   PATIENT:  Disposition Type: Discharged, Disposition: Discharged,         Condition: Stable. (16:22 EI)  IV Infusion: N/A, Patient left the department. (16:31 KSR0)       INSTRUCTION (16:21 EI)   DISCHARGE:  PAIN, GENERAL - WITH PAIN MEDICATION (PAINFUL         CONDITION).   FOLLOWUPRayetta Humphrey, MEDICINE, [address],         [place] [address], [phone], Colleen Can,         [address] [place], [place] [address], [phone].   SPECIAL:  You need to follow-up with a primary care doctor or         orthopedist for further testing and treatment. We will be unable to         prescribed further narcotics for this injury for you in the future.   Key:     EI=Icenbice, PA-C, Erin  KSR0=Rodgers, RN, Iantha Fallen  LDD0=David, RN, Lyn
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
## 2010-05-31 NOTE — ED Provider Notes (Signed)
KNOWN ALLERGIES   NKDA       TRIAGE   PATIENT: NAME: [name], AGE: 60, GENDER: male, DOB:         Wed 07-12-1950, TIME OF GREET: Mon May 31, 2010 15:26, LANGUAGE:         [place], [place]: [id number], MEDICAL RECORD NUMBER: [id number], ACCOUNT         NUMBER: [id number], PCP: Doctor Rene Paci,.   ADMISSION: URGENCY: 4, DEPT: Emergency, BED: WAITING.   VITAL SIGNS: BP 114/76, (Sitting), Pulse 73, Resp 20, Temp 96,         (Oral), Pain 10, O2 Sat 96, on Room air, Time 05/31/2010 15:29.   COMPLAINT:  F/U, Lt Leg Pain.   PRESENTING COMPLAINT:  it will be two weeks before he can se his         doctor. needs something for pain to last until then.   TB SCREENING: TB screen negative for this patient.   ABUSE SCREENING: Patient denies physical abuse or threats.   FALL RISK: Patient has a low risk of falling.   SUICIDAL IDEATION: Suicidal ideation is not present.   ADVANCE DIRECTIVES: Patient does not have advance directives,         Triage assessment performed.   PROVIDERS: TRIAGE NURSE: Annamary Carolin, RN.   PREVIOUS VISIT ALLERGIES: Nkda.       CURRENT MEDICATIONS   Iron (Ferrous Sulfate):  once a day.   Multivitamin:  once a day.       NURSING PROCEDURE: DISCHARGE NOTE   DISCHARGE: Patient discharged to home, ambulating without         assistance, family driving, accompanied by other family member,         Discharge instructions given to patient, Simple or moderate discharge         teaching performed, Above person(s) verbalized understanding of         discharge instructions and follow-up care.       INSTRUCTION   DISCHARGE:  PAIN, GENERAL - WITH PAIN MEDICATION (PAINFUL         CONDITION).   FOLLOWUPRayetta Humphrey, MEDICINE, [address]         [place] [address], [phone], Colleen Can,         [address] [place], [place] [address], [phone].   SPECIAL:  You need to follow-up with a primary care doctor or          orthopedist for further testing and treatment. We will be unable to         prescribed further narcotics for this injury for you in the future.   Key:     EI=Icenbice, PA-C, Erin  KSR0=Rodgers, RN, Iantha Fallen  LDD0=David, RN, Lyn

## 2010-06-04 MED ORDER — TRAMADOL 50 MG TAB
50 mg | ORAL_TABLET | Freq: Three times a day (TID) | ORAL | Status: DC | PRN
Start: 2010-06-04 — End: 2011-07-28

## 2010-06-04 NOTE — Patient Instructions (Signed)
MyChart Activation    Thank you for requesting access to MyChart. Please follow the instructions below to securely access and download your online medical record. MyChart allows you to send messages to your doctor, view your test results, renew your prescriptions, schedule appointments, and more.    How Do I Sign Up?    1. In your internet browser, go to [email or web address]  2. Click on the First Time User? Click Here link in the Sign In box. You will be redirect to the New Member Sign Up page.  3. Enter your MyChart Access Code exactly as it appears below. You will not need to use this code after you???ve completed the sign-up process. If you do not sign up before the expiration date, you must request a new code.    MyChart Access Code: [email or web address]  Expires: 09/02/2010  9:03 AM (This is the date your MyChart access code will expire)    4. Enter the last four digits of your Social Security Number (xxxx) and Date of Birth (mm/dd/yyyy) as indicated and click Submit. You will be taken to the next sign-up page.  5. Create a MyChart ID. This will be your MyChart login ID and cannot be changed, so think of one that is secure and easy to remember.  6. Create a MyChart password. You can change your password at any time.  7. Enter your Password Reset Question and Answer. This can be used at a later time if you forget your password.   8. Enter your e-mail address. You will receive e-mail notification when new information is available in MyChart.  9. Click Sign Up. You can now view and download portions of your medical record.  10. Click the Download Summary menu link to download a portable copy of your medical information.    Additional Information    If you have questions, please call [phone]. Remember, MyChart is NOT to be used for urgent needs. For medical emergencies, dial 911.

## 2010-06-04 NOTE — Progress Notes (Signed)
HISTORY OF PRESENT ILLNESS  [name] is a 60 y.o. male.  HPI  The patient comes in after evaluation in the ER several times for left lower extremity pain.  Apparently he was referred to the pain clinic and this is where he wanted to go but was told that he needs a referral from neurology.    He complains of left lower extremity pain, which is around the knee.  He said initially there was some pain over the anterior side and below the knee.   This knee pain is now severe and continuous; it started three or four weeks ago.  He has been prescribed Percocet and Vicodin.  He says that Percocet works best and now he is off the medication and he needs to see [place] for some refills.  He does not have a primary care physician, though.  He has not been seen by any recently.  He does have some history of lower back pain but none now.  His pain is not radiating from the lower back.  He denies any other medical problems.  He indicates that he is pretty healthy.  He denies smoking and drinking.  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     I have tried to review his medical records.  We do not have the reports from the ER but I can see that he had a venous ultrasound to rule out deep vein thrombosis in early March and that was normal.  There was also an x-ray of the left knee, an x-ray of the lower back and at one point there was an abdominal study because he was complaining of abdominal pain.  It appears that he has been in with pain several times and not only the left lower extremity pain.  A lady friend of his accompanies him.  He does not bring a list of medications but indicates that the only medicine that he takes is Aleve now.        Review of Systems   Constitutional: Negative for fever, chills, weight loss, malaise/fatigue and diaphoresis.   HENT: Negative for neck pain.    Eyes: Negative for blurred vision and double vision.   Respiratory: Negative for cough and shortness of breath.     Cardiovascular: Negative for chest pain, palpitations and leg swelling.   Gastrointestinal: Negative for nausea and vomiting.   Genitourinary: Negative for urgency and frequency.   Musculoskeletal: Positive for joint pain. Negative for back pain.   Skin: Negative for rash.   Neurological: Negative for dizziness, tingling, tremors, sensory change and headaches.   Psychiatric/Behavioral: Negative for memory loss.     Physical Exam  Visit Vitals   Item Reading   ??? BP 130/80   ??? Pulse 84   ??? Temp(Src) 97.6 ??F (36.4 ??C) (Oral)   ??? Ht 5\' 10"  (1.778 m)   ??? Wt 230 lb (104.327 kg)   ??? BMI 33.00 kg/m2       General exam:  Well developed, well nourished, in no acute distress.  HEENT: Pupils equal and reactive. Mouth- oro-paharynx clear. Neck supple, no bruits, no masses.  Heart- regular rhythm and rate.  Respiratory- lungs clear to ausculation.  Extremities-no cyanosis, edema, clubbing.    Neurological exam:  Cognition-normal.  Speech: normal, no dysarthria, no aphasia  Cranial nerves II-XII:  2 No visual field deficit on confrontation.  3,4,6 Full extraocular movements, no diplopia, no ptosis.  5,7 Symmetrical face, without sensory or motor  deficit.  8 Hearing preserved to finger rubbing.  9-12 Palate elevates in the midline, tongue protrudes in the midline.  Motor: normal strength in all extremities except left lower extremity pain limited ROM.  Reflexes: DTR 2+ upper extremities and right knee, left knee not tested, ankles 1+, toes down going.  Coordination:No ataxia, no tremors.  Sensory: pain to touch in the left knee area, also lower leg, functional component is likely.  Gait: antalgic     ASSESSMENT and PLAN  This patient comes in for left lower extremity pain around the knee.  I do not find any signs or a history to indicate a radiculopathy or neuropathy process.     My impression is that in fact this patient is here only to get a prescription for analgesics.  But evidently I have no way to prove it and I will give him all the benefit of the doubt.  I recommended that he get a primary care physician as soon as possible.  I did prescribe tramadol 50 mg to be taken one to two every eight hours.  At this time, I gave him 20 tablets and told him that he will need to get a primary doctor and see if he needs to get an orthopedic surgeon and what exactly is going on with his knee.  But this does not appear to be a neurological problem and three weeks after the onset of this pain, I would not go for any neurophysiology studies.  He is concerned about his referral to the pain clinic and I told him that for that the primary care physician can help him and if the pain does not get better, a cause is not found and he appears to have chronic pain, for which some time is needed, then definitely that referral can be sent again.

## 2010-06-15 NOTE — Progress Notes (Unsigned)
HISTORY OF PRESENT ILLNESS  [name] is a 60 y.o. male.  HPI    ROS    Physical Exam    ASSESSMENT and PLAN  {ASSESSMENT/PLAN:19072}

## 2010-12-22 LAB — METABOLIC PANEL, COMPREHENSIVE
A-G Ratio: 0.9 (ref 0.8–1.7)
ALT (SGPT): 120 U/L — ABNORMAL HIGH (ref 30–65)
AST (SGOT): 116 U/L — ABNORMAL HIGH (ref 15–37)
Albumin: 3.4 g/dL (ref 3.4–5.0)
Alk. phosphatase: 109 U/L (ref 50–136)
Anion gap: 6 mmol/L (ref 5–15)
BUN/Creatinine ratio: 6 — ABNORMAL LOW (ref 12–20)
BUN: 5 MG/DL — ABNORMAL LOW (ref 7–18)
Bilirubin, total: 1.3 MG/DL — ABNORMAL HIGH (ref 0.2–1.0)
CO2: 27 MMOL/L (ref 21–32)
Calcium: 7.8 MG/DL — ABNORMAL LOW (ref 8.4–10.4)
Chloride: 105 MMOL/L (ref 100–108)
Creatinine: 0.9 MG/DL (ref 0.6–1.3)
GFR est AA: >60 mL/min/{1.73_m2} (ref >60)
GFR est non-AA: >60 mL/min/{1.73_m2} (ref >60)
Globulin: 3.8 g/dL (ref 2.0–4.0)
Glucose: 86 MG/DL (ref 74–99)
Potassium: 3.2 MMOL/L — ABNORMAL LOW (ref 3.5–5.5)
Protein, total: 7.2 g/dL (ref 6.4–8.2)
Sodium: 138 MMOL/L (ref 136–145)

## 2010-12-22 LAB — CARDIAC PANEL,(CK, CKMB & TROPONIN)
CK - MB: 9.8 ng/ml — ABNORMAL HIGH (ref 0.5–3.6)
CK-MB Index: 2.4 % (ref 0.0–4.0)
CK: 410 U/L — ABNORMAL HIGH (ref 39–308)
Troponin-I, QT: <0.04 NG/ML (ref 0.00–0.08)

## 2010-12-22 LAB — CBC WITH AUTOMATED DIFF
ABS. BASOPHILS: 0 10*3/uL (ref 0.0–0.1)
ABS. EOSINOPHILS: 0.4 10*3/uL (ref 0.0–0.4)
ABS. LYMPHOCYTES: 0.9 10*3/uL (ref 0.9–3.6)
ABS. MONOCYTES: 0.9 10*3/uL (ref 0.05–1.2)
ABS. NEUTROPHILS: 2.6 10*3/uL (ref 1.8–8.0)
BASOPHILS: 1 % (ref 0–2)
EOSINOPHILS: 8 % — ABNORMAL HIGH (ref 0–5)
HCT: 42.2 % (ref 36.0–48.0)
HGB: 15 g/dL (ref 13.0–16.0)
LYMPHOCYTES: 19 % — ABNORMAL LOW (ref 21–52)
MCH: 34.7 PG — ABNORMAL HIGH (ref 24.0–34.0)
MCHC: 35.5 g/dL (ref 31.0–37.0)
MCV: 97.7 FL — ABNORMAL HIGH (ref 74.0–97.0)
MONOCYTES: 18 % — ABNORMAL HIGH (ref 3–10)
MPV: 11 FL (ref 9.2–11.8)
NEUTROPHILS: 54 % (ref 40–73)
PLATELET: 47 10*3/uL — ABNORMAL LOW (ref 135–420)
RBC: 4.32 M/uL — ABNORMAL LOW (ref 4.70–5.50)
RDW: 13.5 % (ref 11.6–14.5)
WBC: 4.7 10*3/uL (ref 4.6–13.2)

## 2010-12-22 NOTE — ED Notes (Signed)
Discharge instructions discussed with patient. Patient verbalizes understanding.

## 2010-12-22 NOTE — ED Notes (Signed)
"  Im having trouble breathing for the last few days, I have body aches, my knees hurt, I have cough with green to black and I don't smoke, sometime I have chest pain but not now" pt placed in wheelchair for comfort

## 2010-12-22 NOTE — ED Notes (Deleted)
"  I'm having a sickle cell crisis, my lower back and my left hip are hurting  Me" pt walks with steady gait, pt placed in wheelchair for comfort.

## 2010-12-22 NOTE — ED Provider Notes (Signed)
HPI Comments: 60 yo M presents to the ER c/o SOB and flu like symptoms. Pt states that he has had SOB for the past couple of weeks that is worse with exertion. Pt also reports associated productive cough with green sputum. Pt states that he had flu like symptoms for the past 3 days and had n/v for two days, none today. 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  Pt also reports rash to abdomen and back that is itchy. No further symptoms or complaints were expressed at this time.       Past Medical History   Diagnosis Date   ??? Hypertension    ??? Other ill-defined conditions      sickle cell        No past surgical history on file.      No family history on file.     History     Social History   ??? Marital Status: Divorced     Spouse Name: N/A     Number of Children: N/A   ??? Years of Education: N/A     Occupational History   ??? Not on file.     Social History Main Topics   ??? Smoking status: Former Smoker   ??? Smokeless tobacco: Not on file   ??? Alcohol Use: Yes   ??? Drug Use: Not on file   ??? Sexually Active: Not on file     Other Topics Concern   ??? Not on file     Social History Narrative   ??? No narrative on file                  ALLERGIES: Review of patient's allergies indicates no known allergies.      Review of Systems   Constitutional: Negative.  Negative for fever and activity change.   HENT: Negative.  Negative for congestion, sore throat and rhinorrhea.    Eyes: Negative for photophobia, pain, discharge and visual disturbance.   Respiratory: Positive for cough and shortness of breath. Negative for apnea and wheezing.    Cardiovascular: Negative.  Negative for chest pain and leg swelling.   Gastrointestinal: Positive for nausea and vomiting. Negative for abdominal pain, diarrhea and blood in stool.   Genitourinary: Negative.  Negative for dysuria and frequency.   Musculoskeletal: Negative.  Negative for myalgias.   Skin: Positive for rash.    Neurological: Negative.  Negative for dizziness, syncope and headaches.   Hematological: Negative.    Psychiatric/Behavioral: Negative.  Negative for suicidal ideas.       Filed Vitals:    12/22/10 1627   BP: 132/90   Pulse: 77   Temp: 97.1 ??F (36.2 ??C)   Resp: 17   Height: 5\' 10"  (1.778 m)   Weight: 104.327 kg (230 lb)            Physical Exam   Constitutional: He is oriented to person, place, and time. He appears well-developed and well-nourished.  Non-toxic appearance. He does not have a sickly appearance. He does not appear ill. No distress.   HENT:   Head: Normocephalic and atraumatic.   Mouth/Throat: Oropharynx is clear and moist. No oropharyngeal exudate.   Eyes: Conjunctivae and EOM are normal. Pupils are equal, round, and reactive to light. No scleral icterus.   Neck: Normal range of motion. Neck supple. No hepatojugular reflux and no JVD present. No tracheal deviation present. No thyromegaly present.  Cardiovascular: Normal rate, regular rhythm, S1 normal, S2 normal, normal heart sounds, intact distal pulses and normal pulses.  Exam reveals no gallop, no S3 and no S4.    No murmur heard.  Pulses:       Radial pulses are 2+ on the right side, and 2+ on the left side.        Dorsalis pedis pulses are 2+ on the right side, and 2+ on the left side.   Pulmonary/Chest: Effort normal and breath sounds normal. No respiratory distress. He has no decreased breath sounds. He has no wheezes. He has no rhonchi. He has no rales.   Abdominal: Soft. Normal appearance and bowel sounds are normal. He exhibits no distension and no mass. There is no hepatosplenomegaly. There is no tenderness. There is no rigidity, no rebound, no guarding, no CVA tenderness, no tenderness at McBurney's point and negative Murphy's sign.   Musculoskeletal: Normal range of motion.   Lymphadenopathy:        Head (right side): No submental, no submandibular, no preauricular and no occipital adenopathy present.         Head (left side): No submental, no submandibular, no preauricular and no occipital adenopathy present.     He has no cervical adenopathy.        Right: No supraclavicular adenopathy present.        Left: No supraclavicular adenopathy present.   Neurological: He is alert and oriented to person, place, and time. He has normal strength and normal reflexes. He is not disoriented. No cranial nerve deficit or sensory deficit. Coordination and gait normal. GCS eye subscore is 4. GCS verbal subscore is 5. GCS motor subscore is 6.   Skin: Skin is warm, dry and intact. No rash noted. He is not diaphoretic.   Psychiatric: He has a normal mood and affect. His speech is normal and behavior is normal. Judgment and thought content normal. Cognition and memory are normal.        MDM     Differential Diagnosis; Clinical Impression; Plan:     Dyspnea  ACS   CHF  Infection        Patient was discharged in stable condition.  Patient was reassessed and feeling much better, pain free, with SOB reduced.  Patient was discharged with dyspnea instructions.  Patient is to return to emergency department if any new or worsening condition.    7:24 PM, 12/22/2010     Procedures    Provider documentation is written by Daryel Gerald acting as a scribe for Dr. Maura Crandall, DO.    I have reviewed the information recorded by the scribe and agree with its contents: Dr. Maura Crandall, DO.

## 2010-12-22 NOTE — ED Notes (Signed)
Patient armband removed and shredded\\[id number]\\

## 2010-12-23 LAB — EKG, 12 LEAD, INITIAL
Atrial Rate: 78 {beats}/min
Calculated P Axis: 20 degrees
Calculated R Axis: 14 degrees
Calculated T Axis: 30 degrees
Diagnosis: NORMAL
P-R Interval: 118 ms
Q-T Interval: 390 ms
QRS Duration: 88 ms
QTC Calculation (Bezet): 444 ms
Ventricular Rate: 78 {beats}/min

## 2011-01-22 DEATH — deceased

## 2011-07-26 ENCOUNTER — Inpatient Hospital Stay
Admit: 2011-07-26 | Discharge: 2011-07-28 | Disposition: A | Discharge status: Home / Self Care | Payer: Self-pay | Attending: Internal Medicine | Admitting: Internal Medicine

## 2011-07-26 DIAGNOSIS — K529 Noninfective gastroenteritis and colitis, unspecified: Secondary | ICD-10-CM

## 2011-07-26 LAB — METABOLIC PANEL, COMPREHENSIVE
A-G Ratio: 0.9 (ref 0.8–1.7)
ALT (SGPT): 78 U/L (ref 12.0–78.0)
AST (SGOT): 99 U/L — ABNORMAL HIGH (ref 15–37)
Albumin: 3.6 g/dL (ref 3.4–5.0)
Alk. phosphatase: 130 U/L (ref 50–136)
Anion gap: 7 mmol/L (ref 3.0–18)
BUN/Creatinine ratio: 4 — ABNORMAL LOW (ref 12–20)
BUN: 4 MG/DL — ABNORMAL LOW (ref 7.0–18)
Bilirubin, total: 1.2 MG/DL — ABNORMAL HIGH (ref 0.2–1.0)
CO2: 31 MMOL/L (ref 21–32)
Calcium: 8.2 MG/DL — ABNORMAL LOW (ref 8.5–10.1)
Chloride: 103 MMOL/L (ref 100–108)
Creatinine: 0.89 MG/DL (ref 0.6–1.3)
GFR est AA: >60 mL/min/{1.73_m2} (ref >60)
GFR est non-AA: >60 mL/min/{1.73_m2} (ref >60)
Globulin: 3.8 g/dL (ref 2.0–4.0)
Glucose: 175 MG/DL — ABNORMAL HIGH (ref 74–99)
Potassium: 3.4 MMOL/L — ABNORMAL LOW (ref 3.5–5.5)
Protein, total: 7.4 g/dL (ref 6.4–8.2)
Sodium: 141 MMOL/L (ref 136–145)

## 2011-07-26 LAB — CBC WITH AUTOMATED DIFF
ABS. BASOPHILS: 0 10*3/uL (ref 0.0–0.1)
ABS. EOSINOPHILS: 0.2 10*3/uL (ref 0.0–0.4)
ABS. LYMPHOCYTES: 0.7 10*3/uL — ABNORMAL LOW (ref 0.9–3.6)
ABS. MONOCYTES: 0.5 10*3/uL (ref 0.05–1.2)
ABS. NEUTROPHILS: 3.1 10*3/uL (ref 1.8–8.0)
BASOPHILS: 0 % (ref 0–2)
EOSINOPHILS: 5 % (ref 0–5)
HCT: 42.9 % (ref 36.0–48.0)
HGB: 15.2 g/dL (ref 13.0–16.0)
LYMPHOCYTES: 16 % — ABNORMAL LOW (ref 21–52)
MCH: 34.7 PG — ABNORMAL HIGH (ref 24.0–34.0)
MCHC: 35.4 g/dL (ref 31.0–37.0)
MCV: 97.9 FL — ABNORMAL HIGH (ref 74.0–97.0)
MONOCYTES: 11 % — ABNORMAL HIGH (ref 3–10)
MPV: 11.1 FL (ref 9.2–11.8)
NEUTROPHILS: 68 % (ref 40–73)
PLATELET COMMENTS: DECREASED
PLATELET: 54 10*3/uL — ABNORMAL LOW (ref 135–420)
RBC: 4.38 M/uL — ABNORMAL LOW (ref 4.70–5.50)
RDW: 14.4 % (ref 11.6–14.5)
WBC: 4.5 10*3/uL — ABNORMAL LOW (ref 4.6–13.2)

## 2011-07-26 LAB — URINE MICROSCOPIC ONLY
RBC: 1 /HPF (ref 0–5)
WBC: 0 /HPF (ref 0–4)

## 2011-07-26 LAB — LACTIC ACID: Lactic acid: 2.6 MMOL/L — ABNORMAL HIGH (ref 0.4–2.0)

## 2011-07-26 LAB — URINALYSIS W/ RFLX MICROSCOPIC
Bilirubin: NEGATIVE
Glucose: NEGATIVE MG/DL
Ketone: NEGATIVE MG/DL
Leukocyte Esterase: NEGATIVE
Nitrites: NEGATIVE
Protein: NEGATIVE MG/DL
Specific gravity: 1.01 (ref 1.003–1.030)
Urobilinogen: 0.2 EU/DL (ref 0.2–1.0)
pH (UA): 7 (ref 5.0–8.0)

## 2011-07-26 LAB — CARDIAC PANEL,(CK, CKMB & TROPONIN)
CK - MB: 8.8 ng/ml — ABNORMAL HIGH (ref 0.5–3.6)
CK-MB Index: 2.8 % (ref 0.0–4.0)
CK: 313 U/L — ABNORMAL HIGH (ref 39–308)
Troponin-I, QT: 0.02 NG/ML (ref 0.0–0.045)

## 2011-07-26 LAB — PROTHROMBIN TIME + INR
INR: 1.2 (ref 0.8–1.2)
Prothrombin time: 14.3 s (ref 11.5–15.2)

## 2011-07-26 LAB — MAGNESIUM: Magnesium: 1.7 MG/DL — ABNORMAL LOW (ref 1.8–2.4)

## 2011-07-26 LAB — PTT: aPTT: 29.9 s (ref 24.6–37.7)

## 2011-07-26 LAB — LIPASE: Lipase: 172 U/L (ref 73–393)

## 2011-07-26 MED ORDER — FAMOTIDINE (PF) 20 MG/2 ML IV
20 mg/2 mL | INTRAVENOUS | Status: AC
Start: 2011-07-26 — End: 2011-07-26
  Administered 2011-07-26: 19:00:00 via INTRAVENOUS

## 2011-07-26 MED ORDER — HYDROMORPHONE (PF) 1 MG/ML IJ SOLN
1 mg/mL | Freq: Once | INTRAMUSCULAR | Status: AC
Start: 2011-07-26 — End: 2011-07-26
  Administered 2011-07-26: 23:00:00 via INTRAVENOUS

## 2011-07-26 MED ORDER — HYDROMORPHONE (PF) 1 MG/ML IJ SOLN
1 mg/mL | Freq: Once | INTRAMUSCULAR | Status: AC
Start: 2011-07-26 — End: 2011-07-26
  Administered 2011-07-26: 19:00:00 via INTRAVENOUS

## 2011-07-26 MED ORDER — IOVERSOL 320 MG/ML IV SOLN
320 mg iodine/mL | Freq: Once | INTRAVENOUS | Status: AC
Start: 2011-07-26 — End: 2011-07-26
  Administered 2011-07-26: 22:00:00 via INTRAVENOUS

## 2011-07-26 MED ORDER — ONDANSETRON (PF) 4 MG/2 ML INJECTION
4 mg/2 mL | INTRAMUSCULAR | Status: AC
Start: 2011-07-26 — End: 2011-07-26
  Administered 2011-07-26: 19:00:00 via INTRAVENOUS

## 2011-07-26 MED ORDER — POTASSIUM CHLORIDE 10 MEQ/100 ML IV PIGGY BACK
10 mEq/0 mL | INTRAVENOUS | Status: AC
Start: 2011-07-26 — End: 2011-07-26
  Administered 2011-07-26: 20:00:00 via INTRAVENOUS

## 2011-07-26 MED ORDER — DIATRIZOATE MEGLUMINE & SODIUM 66 %-10 % ORAL SOLN
66-10 % | Freq: Once | ORAL | Status: AC
Start: 2011-07-26 — End: 2011-07-26
  Administered 2011-07-26: 21:00:00 via ORAL

## 2011-07-26 MED ADMIN — sodium chloride 0.9 % bolus infusion 1,000 mL: INTRAVENOUS | @ 19:00:00 | NDC 00409798309

## 2011-07-26 MED ADMIN — 0.9% sodium chloride infusion: INTRAVENOUS | @ 19:00:00 | NDC 00409798309

## 2011-07-26 MED ADMIN — magnesium sulfate 2 g/50 ml IVPB (premix or compounded): INTRAVENOUS | @ 20:00:00 | NDC 09999416002

## 2011-07-26 MED FILL — HYDROMORPHONE (PF) 1 MG/ML IJ SOLN: 1 mg/mL | INTRAMUSCULAR | Qty: 1

## 2011-07-26 MED FILL — ONDANSETRON HCL 4 MG/2 ML INTRAVENOUS SYRINGE: 4 mg/2 mL | INTRAVENOUS | Qty: 2

## 2011-07-26 MED FILL — FAMOTIDINE (PF) 20 MG/2 ML IV: 20 mg/2 mL | INTRAVENOUS | Qty: 2

## 2011-07-26 MED FILL — OPTIRAY 320 MG IODINE/ML INTRAVENOUS SOLUTION: 320 mg iodine/mL | INTRAVENOUS | Qty: 100

## 2011-07-26 MED FILL — SODIUM CHLORIDE 0.9 % IV: INTRAVENOUS | Qty: 1000

## 2011-07-26 MED FILL — POTASSIUM CHLORIDE 10 MEQ/100 ML IV PIGGY BACK: 10 mEq/0 mL | INTRAVENOUS | Qty: 100

## 2011-07-26 MED FILL — MD-GASTROVIEW 66 %-10 % ORAL SOLUTION: 66-10 % | ORAL | Qty: 30

## 2011-07-26 MED FILL — MAGNESIUM SULFATE 2 GRAM/50 ML IVPB: 2 gram/50 mL (4 %) | INTRAVENOUS | Qty: 50

## 2011-07-26 NOTE — ED Notes (Signed)
"  Something has been tearing my stomach up for 3 days".

## 2011-07-26 NOTE — ED Notes (Signed)
Try to call report, receiving nurse c/o 5 south not ready.

## 2011-07-26 NOTE — H&P (Signed)
[facility]                               [address] [place], [place] [address]                               HISTORY AND PHYSICAL    PATIENT:      [name], [name]  MRN:             [id number]    DATE:   07/26/2011  BILLING:         [id number]   DOB:    07/02/1950  ROOM:         [id number]  REFERRING:  DICTATING:    Daine Gravel, MD        PRIMARY CARE DOCTOR: None.    CHIEF COMPLAINT: Abdominal pain, nausea and vomiting.    HISTORY OF PRESENT ILLNESS: The patient is a 61 year old male who has a  history of liver cirrhosis, alcohol abuse. He said [place] here last [place]. He said he has been having 4-5 days  of abdominal pain. He has had some nausea, vomiting and diarrhea, 4-5  stools a day. He ended up coming to the ER today. His workup included an  abdominal x-ray that was essentially negative, some mild air-fluid levels.  CT of the abdomen showed a long segment of mucosal thickening per Dr.  Janey Greaser. Official report is pending. Dr. Janey Greaser discussed the case  with Dr. Margarette Canada. The patient has a mildly elevated lactate level around  2.6. So it was advised that the patient should get admitted. The patient  has been started on IV antibiotics for possible colitis. GI is going to  evaluate the patient tomorrow.    REVIEW OF SYSTEMS  EYES: No blurry vision.  ENT: No runny nose. No sore throat.  RESPIRATORY: No shortness of breath.  CARDIOVASCULAR: Denied any chest pain.  GASTROINTESTINAL: Has nausea, vomiting, diarrhea, abdominal pain.  GENITOURINARY: No dysuria.  MUSCULOSKELETAL: Denies joint pains or aches.  NEUROLOGIC: No focal weaknesses.  SKIN: No rashes.  The rest of the 12-point review of systems asked is negative.    PAST MEDICAL HISTORY: Significant for cirrhosis.    MEDICATIONS: None.    SOCIAL HISTORY: Ex-smoker, drinks alcohol socially. The last drink he had  one beer yesterday.     FAMILY HISTORY: Heart disease.    ALLERGIES: NONE.    PHYSICAL EXAMINATION  GENERAL: Right now he is in moderate distress from abdominal pain.  VITAL SIGNS: Temperature is 98.1, blood pressure 129/80, pulse 73,  respirations 15, saturations 100%.  HEENT: Atraumatic, normocephalic.  NECK: Supple. No JVD. No bruits or thyromegaly, no masses.  LUNGS: Clear to auscultation . No wheezing, no rales.  CARDIOVASCULAR: Regular rate. No murmur.  ABDOMEN: Soft, nontender, positive bowel sounds x4. No rebound, rigidity,  or guarding.  LEGS: No clubbing, cyanosis, or edema.  NEUROLOGIC: Awake and alert, moving all extremities fine, 2+ pedal pulses.    LABORATORY DATA: Normal. White count 4.5. Hemoglobin and hematocrit are 15  and 42, platelets around 54, looks like looking back he has chronically low  platelets. UA is negative. INR 1.2. Potassium of 3.4,  glucose 175. Cardiac  enzymes are negative. Lipase is 172. AST is mildly elevated around 99.    ASSESSMENT AND PLAN: Nausea, vomiting and diarrhea. Differential diagnosis  includes gastroenteritis, sounds more  bacterial. Abdominal CT with bowel  wall thickening.    Plan  1. I am going continue the patient on IV Rocephin and Flagyl. Check the  stool for C difficile, culture, ova and parasites and leukocytes. Also  hydrate the patient with IV fluids. GI will see the patient tomorrow.  Repeat a lactate level tomorrow.  2. Cirrhosis of liver with chronic thrombocytopenia. Advised the patient to  stop drinking.  3. For DVT prophylaxis we will keep him on sequential TED's for now.  4. For GI prophylaxis, put on IV Protonix.                                  Daine Gravel, MD    KSS:wmx  D: 07/26/2011  11:05 P  T: 07/26/2011  11:47 P  Job: [id number]  CScriptDoc #: [id number]    cc:   Daine Gravel, MD

## 2011-07-26 NOTE — ED Notes (Signed)
Try to call report 2nd time, receiving nurse not ready.

## 2011-07-26 NOTE — ED Provider Notes (Signed)
HPI Comments: [name] is a 61 y.o. male with Hx of liver cirrhosis C/O lower abdominal pain gradually worsening over the past 3 days. Pt reports associated N/V/D. Pt admits to drinking a few beers a couple days ago, which he admits he shouldn't do due to his cirrhosis. Pt took a few Vicodin from his family without lasting relief. No fever, dark or tarry stool, chills, urinary Sx, diarrhea, constipation, CP, SOB, leg pain or swelling, or any other Sx at this time.     Patient is a 61 y.o. male presenting with abdominal pain, nausea, vomiting, and diarrhea.   Abdominal Pain   Associated symptoms include diarrhea, nausea and vomiting. Pertinent negatives include no fever, no dysuria, no hematuria, no headaches, no chest pain and no back pain.   Nausea   Associated symptoms include abdominal pain and diarrhea. Pertinent negatives include no chills, no fever, no headaches, no cough and no headaches.   Vomiting   Associated symptoms include abdominal pain and diarrhea. Pertinent negatives include no chills, no fever, no headaches, no cough and no headaches.   Diarrhea   Associated symptoms include abdominal pain and vomiting. Pertinent negatives include no chills, no headaches, no cough and no back pain.        Past Medical History   Diagnosis Date   ??? Hypertension    ??? Other ill-defined conditions      sickle cell        No past surgical history on file.      Family History   Problem Relation Age of Onset   ??? Heart Disease Father 11     MI   ??? Heart Disease Brother         History     Social History   ??? Marital Status: DIVORCED     Spouse Name: N/A     Number of Children: N/A   ??? Years of Education: N/A     Occupational History   ??? Not on file.     Social History Main Topics   ??? Smoking status: Former Smoker   ??? Smokeless tobacco: Not on file   ??? Alcohol Use: Yes   ??? Drug Use: No   ??? Sexually Active: Not on file     Other Topics Concern   ??? Not on file     Social History Narrative   ??? No narrative on file                   ALLERGIES: Review of patient's allergies indicates no known allergies.      Review of Systems   Constitutional: Negative for fever and chills.   HENT: Negative for ear pain, congestion, sore throat, rhinorrhea, neck pain and neck stiffness.    Respiratory: Negative for cough and shortness of breath.    Cardiovascular: Negative for chest pain and leg swelling.   Gastrointestinal: Positive for nausea, vomiting, abdominal pain and diarrhea. Negative for blood in stool.   Genitourinary: Negative for dysuria, hematuria and flank pain.   Musculoskeletal: Negative for back pain and joint swelling.   Skin: Negative for rash.   Neurological: Negative for seizures, syncope, facial asymmetry, weakness, numbness and headaches.   Psychiatric/Behavioral: Negative for suicidal ideas and dysphoric mood.   All other systems reviewed and are negative.        Filed Vitals:    07/26/11 1428   BP: 141/92   Pulse: 81   Temp: 97 ??F (36.1 ??C)  Resp: 22   Height: 5\' 10"  (1.778 m)   Weight: 99.791 kg (220 lb)   SpO2: 97%            Physical Exam   Nursing note and vitals reviewed.  Constitutional: He is oriented to person, place, and time. He appears well-developed and well-nourished. No distress.   HENT:   Head: Normocephalic and atraumatic.   Right Ear: External ear normal.   Left Ear: External ear normal.   Nose: Nose normal.   Mouth/Throat: Oropharynx is clear and moist.   Eyes: Conjunctivae and EOM are normal. Pupils are equal, round, and reactive to light. No scleral icterus.   Neck: Normal range of motion. Neck supple. No JVD present. No tracheal deviation present. No thyromegaly present.   Cardiovascular: Normal rate, regular rhythm, normal heart sounds and intact distal pulses.  Exam reveals no gallop and no friction rub.    No murmur heard.  Pulmonary/Chest: Effort normal and breath sounds normal. He exhibits no tenderness.   Abdominal: Soft. Bowel sounds are normal. He exhibits no distension. There is tenderness.  There is no rebound and no guarding.        Diffuse poorly localized pain without guarding   Musculoskeletal: Normal range of motion. He exhibits edema. He exhibits no tenderness.        Trace edema with mild chronic discoloration (pt states that is chronic)     Lymphadenopathy:     He has no cervical adenopathy.   Neurological: He is alert and oriented to person, place, and time. He has normal reflexes. No cranial nerve deficit. Coordination normal.        No sensory loss, Gait normal, Motor 5/5   Skin: Skin is warm and dry.   Psychiatric: He has a normal mood and affect. His behavior is normal. Judgment and thought content normal.        MDM     Differential Diagnosis; Clinical Impression; Plan:     Pt is a 61yo male with a h/o liver disease as well as regular alcohol use presents to the ED with complaint of one week of increasing abdominal pain with vomiting and diarrhea.  Pt pain does not localize well but complains of severe pain.  Will follow abdominal labs, lactate, UA, hydrate pt, AXR followed by further imaging as needed.  Pt asks for IV pain medication and admits to taking the family vicodin.  Suspect pt uses opiate analgesics regularly.  Arnell Sieving, MD 2:56 PM          Procedures    AXR no free air, isolated AF level of LUQ    CT scan prelim reading notes long segment of mucosal thickening with FF.  Discussed with Dr. Ladoris Gene and given the elevated lactate recommends admission then will plan for colonoscopy.  Arnell Sieving, MD 9:19 PM    Discussed with the hospitalist and will proceed with admission. Arnell Sieving, MD 9:29 PM        I have reviewed the information recorded by the scribe and agree with its contents: Dr. Alver Sorrow, MD    Written by Maudry Mayhew, ED scribe, as dictated by Dr. Alver Sorrow, MD

## 2011-07-26 NOTE — ED Notes (Signed)
TRANSFER - OUT REPORT:    Verbal report given to RN BOx(name) on [name]  being transferred to [facility](unit) for routine progression of care       Report consisted of patient???s Situation, Background, Assessment and   Recommendations(SBAR).     Information from the following report(s) SBAR, Kardex, ED Summary, Intake/Output and MAR was reviewed with the receiving nurse.    Opportunity for questions and clarification was provided.

## 2011-07-26 NOTE — H&P (Signed)
Dictated [phone]

## 2011-07-26 NOTE — Other (Signed)
TRANSFER - IN REPORT:    Verbal report received from Erick, RN(name) on ZACKERIAH KISSLER  being received from ER(unit) for routine progression of care      Report consisted of patient???s Situation, Background, Assessment and   Recommendations(SBAR).     Information from the following report(s) SBAR, ED Summary, [facility] and Recent Results was reviewed with the receiving nurse.    Opportunity for questions and clarification was provided.      Assessment completed upon patient???s arrival to unit and care assumed.

## 2011-07-26 NOTE — ED Notes (Signed)
Bedside shift change report given to Rolm Gala (Cabin crew) by Dorene Grebe (offgoing nurse).  Report given with SBAR and ED Summary.

## 2011-07-26 NOTE — ED Notes (Signed)
Try to call report 3rd time, receiving nurse still not ready.

## 2011-07-27 LAB — METABOLIC PANEL, BASIC
Anion gap: 7 mmol/L (ref 3.0–18)
BUN/Creatinine ratio: 9 — ABNORMAL LOW (ref 12–20)
BUN: 7 MG/DL (ref 7.0–18)
CO2: 27 MMOL/L (ref 21–32)
Calcium: 7.8 MG/DL — ABNORMAL LOW (ref 8.5–10.1)
Chloride: 106 MMOL/L (ref 100–108)
Creatinine: 0.74 MG/DL (ref 0.6–1.3)
GFR est AA: >60 mL/min/{1.73_m2} (ref >60)
GFR est non-AA: >60 mL/min/{1.73_m2} (ref >60)
Glucose: 91 MG/DL (ref 74–99)
Potassium: 3.5 MMOL/L (ref 3.5–5.5)
Sodium: 140 MMOL/L (ref 136–145)

## 2011-07-27 LAB — CBC WITH AUTOMATED DIFF
ABS. BASOPHILS: 0 10*3/uL (ref 0.0–0.06)
ABS. BASOPHILS: 0 10*3/uL (ref 0.0–0.06)
ABS. EOSINOPHILS: 0.5 10*3/uL — ABNORMAL HIGH (ref 0.0–0.4)
ABS. EOSINOPHILS: 0.2 10*3/uL (ref 0.0–0.4)
ABS. LYMPHOCYTES: 0.8 10*3/uL — ABNORMAL LOW (ref 0.9–3.6)
ABS. LYMPHOCYTES: 0.7 10*3/uL — ABNORMAL LOW (ref 0.8–3.5)
ABS. MONOCYTES: 0.7 10*3/uL (ref 0.05–1.2)
ABS. MONOCYTES: 0.5 10*3/uL (ref 0–1.0)
ABS. NEUTROPHILS: 3 10*3/uL (ref 1.8–8.0)
ABS. NEUTROPHILS: 1.9 10*3/uL (ref 1.8–8.0)
BAND NEUTROPHILS: 1 % (ref 0–5)
BASOPHILS: 1 % (ref 0–2)
BASOPHILS: 0 % (ref 0–3)
EOSINOPHILS: 10 % — ABNORMAL HIGH (ref 0–5)
EOSINOPHILS: 7 % — ABNORMAL HIGH (ref 0–5)
HCT: 40.6 % (ref 36.0–48.0)
HCT: 37.3 % (ref 36.0–48.0)
HGB: 14.2 g/dL (ref 13.0–16.0)
HGB: 12.8 g/dL — ABNORMAL LOW (ref 13.0–16.0)
LYMPHOCYTES: 16 % — ABNORMAL LOW (ref 21–52)
LYMPHOCYTES: 20 % (ref 20–51)
MCH: 34.1 PG — ABNORMAL HIGH (ref 24.0–34.0)
MCH: 33.8 PG (ref 24.0–34.0)
MCHC: 35 g/dL (ref 31.0–37.0)
MCHC: 34.3 g/dL (ref 31.0–37.0)
MCV: 97.4 FL — ABNORMAL HIGH (ref 74.0–97.0)
MCV: 98.4 FL — ABNORMAL HIGH (ref 74.0–97.0)
MONOCYTES: 14 % — ABNORMAL HIGH (ref 3–10)
MONOCYTES: 15 % — ABNORMAL HIGH (ref 2–9)
MPV: 10.7 FL (ref 9.2–11.8)
MPV: 10.9 FL (ref 9.2–11.8)
NEUTROPHILS: 59 % (ref 40–73)
NEUTROPHILS: 56 % (ref 42–75)
PLATELET COMMENTS: DECREASED
PLATELET: 44 10*3/uL — ABNORMAL LOW (ref 135–420)
PLATELET: 38 10*3/uL — ABNORMAL LOW (ref 135–420)
PROMYELOCYTES: 1 % — ABNORMAL HIGH
RBC: 4.17 M/uL — ABNORMAL LOW (ref 4.70–5.50)
RBC: 3.79 M/uL — ABNORMAL LOW (ref 4.70–5.50)
RDW: 14.9 % — ABNORMAL HIGH (ref 11.6–14.5)
RDW: 14.6 % — ABNORMAL HIGH (ref 11.6–14.5)
WBC: 5 10*3/uL (ref 4.6–13.2)
WBC: 3.4 10*3/uL — ABNORMAL LOW (ref 4.6–13.2)

## 2011-07-27 LAB — EKG, 12 LEAD, INITIAL
Atrial Rate: 78 {beats}/min
Calculated P Axis: -17 degrees
Calculated R Axis: 19 degrees
Calculated T Axis: 29 degrees
P-R Interval: 128 ms
Q-T Interval: 408 ms
QRS Duration: 84 ms
QTC Calculation (Bezet): 465 ms
Ventricular Rate: 78 {beats}/min

## 2011-07-27 LAB — OCCULT BLOOD, STOOL: Occult blood, stool: NEGATIVE

## 2011-07-27 LAB — CARDIAC PANEL,(CK, CKMB & TROPONIN)
CK - MB: 8.2 ng/ml — ABNORMAL HIGH (ref 0.5–3.6)
CK - MB: 9.4 ng/ml — ABNORMAL HIGH (ref 0.5–3.6)
CK-MB Index: 3.1 % (ref 0.0–4.0)
CK-MB Index: 3.6 % (ref 0.0–4.0)
CK: 266 U/L (ref 39–308)
CK: 264 U/L (ref 39–308)
Troponin-I, QT: 0.02 NG/ML (ref 0.0–0.045)
Troponin-I, QT: 0.03 NG/ML (ref 0.0–0.045)

## 2011-07-27 LAB — C DIFFICILE TOXIN A & B BY EIA: C. difficile toxin A&B: NEGATIVE

## 2011-07-27 LAB — HEMOGLOBIN A1C WITH EAG: Hemoglobin A1c: 5.2 % (ref 4.2–6.3)

## 2011-07-27 LAB — WBC, STOOL

## 2011-07-27 LAB — LACTIC ACID: Lactic acid: 0.7 MMOL/L (ref 0.4–2.0)

## 2011-07-27 MED ORDER — LEVOFLOXACIN IN D5W 500 MG/100 ML IV PIGGY BACK
500 mg/100 mL | INTRAVENOUS | Status: DC
Start: 2011-07-27 — End: 2011-07-26
  Administered 2011-07-27: 03:00:00 via INTRAVENOUS

## 2011-07-27 MED ORDER — HYDROMORPHONE (PF) 1 MG/ML IJ SOLN
1 mg/mL | INTRAMUSCULAR | Status: DC | PRN
Start: 2011-07-27 — End: 2011-07-27
  Administered 2011-07-27 (×4): via INTRAVENOUS

## 2011-07-27 MED ORDER — CEFTRIAXONE 2 GRAM SOLUTION FOR INJECTION
2 gram | INTRAMUSCULAR | Status: DC
Start: 2011-07-27 — End: 2011-07-27
  Administered 2011-07-27: 06:00:00 via INTRAVENOUS

## 2011-07-27 MED ORDER — METRONIDAZOLE IN SODIUM CHLORIDE (ISO-OSM) 500 MG/100 ML IV PIGGY BACK
500 mg/100 mL | Freq: Three times a day (TID) | INTRAVENOUS | Status: DC
Start: 2011-07-27 — End: 2011-07-27
  Administered 2011-07-27 (×2): via INTRAVENOUS

## 2011-07-27 MED ORDER — PANTOPRAZOLE 40 MG IV SOLR
40 mg | Freq: Every day | INTRAVENOUS | Status: DC
Start: 2011-07-27 — End: 2011-07-27
  Administered 2011-07-27: 05:00:00 via INTRAVENOUS

## 2011-07-27 MED ORDER — METRONIDAZOLE IN SODIUM CHLORIDE (ISO-OSM) 500 MG/100 ML IV PIGGY BACK
500 mg/100 mL | INTRAVENOUS | Status: AC
Start: 2011-07-27 — End: 2011-07-26
  Administered 2011-07-27: via INTRAVENOUS

## 2011-07-27 MED ORDER — LEVOFLOXACIN IN D5W 500 MG/100 ML IV PIGGY BACK
500 mg/100 mL | INTRAVENOUS | Status: AC
Start: 2011-07-27 — End: 2011-07-26
  Administered 2011-07-27: 01:00:00 via INTRAVENOUS

## 2011-07-27 MED ORDER — 1/2 NS WITH POTASSIUM CHLORIDE 20 MEQ/L IV
20 mEq/L | INTRAVENOUS | Status: DC
Start: 2011-07-27 — End: 2011-07-27
  Administered 2011-07-27: 07:00:00 via INTRAVENOUS

## 2011-07-27 MED ADMIN — HYDROcodone-acetaminophen (NORCO) 10-325 mg tablet 1 Tab: ORAL | @ 22:00:00 | NDC 68084035311

## 2011-07-27 MED ADMIN — sodium chloride (NS) flush 5-10 mL: INTRAVENOUS | @ 04:00:00 | NDC 87701099893

## 2011-07-27 MED ADMIN — HYDROcodone-acetaminophen (NORCO) 10-325 mg tablet 1 Tab: ORAL | @ 18:00:00 | NDC 68084035311

## 2011-07-27 MED ADMIN — metroNIDAZOLE (FLAGYL) tablet 500 mg: ORAL | @ 20:00:00 | NDC 50111033302

## 2011-07-27 MED ADMIN — sodium chloride (NS) flush 5-10 mL: INTRAVENOUS | @ 11:00:00 | NDC 87701099893

## 2011-07-27 MED FILL — METRONIDAZOLE IN SODIUM CHLORIDE (ISO-OSM) 500 MG/100 ML IV PIGGY BACK: 500 mg/100 mL | INTRAVENOUS | Qty: 100

## 2011-07-27 MED FILL — HYDROMORPHONE (PF) 1 MG/ML IJ SOLN: 1 mg/mL | INTRAMUSCULAR | Qty: 1

## 2011-07-27 MED FILL — 1/2 NS WITH POTASSIUM CHLORIDE 20 MEQ/L IV: 20 mEq/L | INTRAVENOUS | Qty: 1000

## 2011-07-27 MED FILL — CEFTRIAXONE 2 GRAM SOLUTION FOR INJECTION: 2 gram | INTRAMUSCULAR | Qty: 2

## 2011-07-27 MED FILL — BD POSIFLUSH NORMAL SALINE 0.9 % INJECTION SYRINGE: INTRAMUSCULAR | Qty: 10

## 2011-07-27 MED FILL — LEVOFLOXACIN IN D5W 500 MG/100 ML IV PIGGY BACK: 500 mg/100 mL | INTRAVENOUS | Qty: 100

## 2011-07-27 MED FILL — PROTONIX 40 MG INTRAVENOUS SOLUTION: 40 mg | INTRAVENOUS | Qty: 40

## 2011-07-27 MED FILL — METRONIDAZOLE 250 MG TAB: 250 mg | ORAL | Qty: 2

## 2011-07-27 MED FILL — HYDROCODONE-ACETAMINOPHEN 10 MG-325 MG TAB: 10-325 mg | ORAL | Qty: 1
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
## 2011-07-27 NOTE — Other (Signed)
Bedside and Verbal shift change report given to Ky'arra RN (oncoming nurse) by Carol Ryan RN (offgoing nurse).  Report given with SBAR, Kardex, MAR and Recent Results.

## 2011-07-27 NOTE — Consults (Signed)
[facility]   [email or web address]      Impression:   1.Abd pain,  Etiol;ogy unclear.  2. 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  No acute inflamtory changes  3. Lactate level mildly elevated.  4. Alcohol abuse.      Plan:     1. Follow stool tests  2. Watch for withdrawal  3. PO Flagyl should be fine  4, Recheck lactate in am  5. If stable, can be d/c'd home on oral meds and f/u as outpt.      Chief Complaint: Abd pain      HPI:  HERBIE LEHRMANN is a 61 y.o. male who is being seen on consult for abd pain.Marland Kitchen He has had sx's for several weeks and they acutely worsened with some n/v and loose stools,  Now ended. He is a heavy alcohol abuser and has a small shrunken liver on CT scan ,  As well as some nonspecific odema of the colon w/o inflamation.  In ER his lactate was elevated to 2.6 , so he was admitted for observation and started on abxs.   He denies prio hx of Colonoscopy  .Marland Kitchen He has some pyropsis .Marland Kitchen He is taking quite a bit of pain meds,.  Stool specimens are sent.    PMH:   Past Medical History   Diagnosis Date   ??? Hypertension    ??? Other ill-defined conditions      sickle cell       PSH:   No past surgical history on file.    Social HX:   History     Social History   ??? Marital Status: DIVORCED     Spouse Name: N/A     Number of Children: N/A   ??? Years of Education: N/A     Occupational History   ??? Not on file.     Social History Main Topics   ??? Smoking status: Former Smoker   ??? Smokeless tobacco: Not on file   ??? Alcohol Use: Yes   ??? Drug Use: No   ??? Sexually Active: Not on file     Other Topics Concern   ??? Not on file     Social History Narrative   ??? No narrative on file       FHX:   Family History   Problem Relation Age of Onset   ??? Heart Disease Father 58     MI   ??? Heart Disease Brother        Allergy:   No Known Allergies    Home Medications:     Prescriptions prior to admission   Medication Sig   ??? traMADol (ULTRAM) 50 mg tablet Take 1 Tab by mouth  every eight (8) hours as needed for Pain.       Review of Systems:     Constitutional: No fevers, chills, weight loss, fatigue.   Skin: No rashes, pruritis, jaundice, ulcerations, erythema.   HENT: No headaches, nosebleeds, sinus pressure, rhinorrhea, sore throat.   Eyes: No visual changes, blurred vision, eye pain, photophobia, jaundice.   Cardiovascular: No chest pain, heart palpitations.   Respiratory: No cough, SOB, wheezing, chest discomfort, orthopnea.   Gastrointestinal: Abdpain, n/v and diarrhea   Genitourinary: No dysuria, bleeding, discharge, pyuria.   Musculoskeletal: No weakness, arthralgias, wasting.   Endo: No sweats.   Heme: No bruising, easy bleeding.   Allergies: As noted.   Neurological: Cranial nerves intact.  Alert and oriented.  Gait not assessed.   Psychiatric:  No anxiety, depression, hallucinations.                 BP 145/81   Pulse 84   Temp 96.8 ??F (36 ??C)   Resp 16   Ht 5\' 10"  (1.778 m)   Wt 103.284 kg (227 lb 11.2 oz)   BMI 32.67 kg/m2   SpO2 94%    Physical Assessment:     constitutional: appearance: well developed, well nourished, normal habitus, no deformities, in minimal distress.   skin: inspection: no rashes, ulcers, icterus or other lesions; no clubbing or telangiectasias. palpation: no induration or subcutaneos nodules.   eyes: inspection: normal conjunctivae and lids; no jaundice pupils: symmetrical, normoreactive to light, normal accommodation and size.   ENMT: mouth: normal oral mucosa,lips and gums; good dentition. oropharynx: normal tongue, hard and soft palate; posterior pharynx without erithema, exudate or lesions.   neck: thyroid: normal size, consistency and position; no masses or tenderness.   respiratory: effort: normal chest excursion; no intercostal retraction or accessory muscle use.   cardiovascular: abdominal aorta: normal size and position; no bruits. palpation: PMI of normal size and position; normal rhythm; no thrill or murmurs.   abdominal: abdomen: normal  consistency;generalized tenderness hernias: no hernias appreciated. liver: normal size and consistency. spleen: not palpable.   rectal: hemoccult/guaiac: not performed.   musculoskeletal: digits and nails: no clubbing, cyanosis, petechiae or other inflammatory conditions.  psychiatric: judgement/insight: within normal limits. memory: within normal limits for recent and remote events. mood and affect: no evidence of depression, anxiety or agitation. orientation: oriented to time, space and person.        Basic Metabolic Profile   Recent Labs   Basename 07/27/11 0600 07/26/11 1457    NA 140 --    K 3.5 --    CL 106 --    CO2 27 --    BUN 7 --    GLU 91 --    CA 7.8* --    MG -- 1.7*    PHOS -- --         CBC w/Diff    Recent Labs   Basename 07/27/11 0600    WBC 5.0    RBC 4.17*    HGB 14.2    HCT 40.6    MCV 97.4*    MCH 34.1*    MCHC 35.0    RDW 14.9*    PLT 44*    Recent Labs   Basename 07/27/11 0600    GRANS 59    LYMPH 16*    EOS 10*    PRO --    MYELO --    METAS --    BLAST --        Hepatic Function   Recent Labs   Basename 07/26/11 1457    ALB 3.6    TP 7.4    GPT 78    SGOT 99*    AP 130    AML --    LPSE 172          Tressie Ellis, MD, M.D.   [facility] [place]  [phone]  [email or web address]

## 2011-07-27 NOTE — Progress Notes (Signed)
Chaplain conducted an initial consultation and Spiritual Assessment for [name], who is a 61 y.o.,male. Patient???s Primary Language is: [place].   According to the patient???s EMR Religious Affiliation is: [place].     The reason the Patient came to the hospital is:   Patient Active Problem List   Diagnoses Date Noted   ??? Other and unspecified noninfectious gastroenteritis and colitis 07/26/2011   ??? Alcoholic cirrhosis of liver 07/26/2011        The Chaplain provided the following Interventions:  Initiated a relationship of care and support.   Explored issues of faith, belief, spirituality and religious/ritual needs while hospitalized.  Listened empathically.  Provided chaplaincy education.  Provided information about Spiritual Care Services.  Offered prayer and assurance of continued prayers on patients behalf.   Chart reviewed.    The following outcomes where achieved:  Patient shared limited information about both their medical narrative and spiritual journey/beliefs.  Chaplain confirmed Patient's Religious Affiliation.  Patient processed feeling about current hospitalization.  Patient expressed gratitude for pastoral care visit.    Assessment:  Patient does not have any religious/cultural needs that will affect patient???s preferences in health care.  There are no spiritual or religious issue which require intervention at this time.     Plan:  Chaplains will continue to follow and will provide pastoral care on an as needed/requested basis.  Chaplain recommends bedside caregivers page chaplain on duty if patient shows signs of acute spiritual or emotional distress.    Chaplain Chelsea Primus, [place]    Chaplain Resident  Spiritual Care  [phone]

## 2011-07-27 NOTE — Other (Signed)
TRANSFER - IN REPORT:    Verbal report received from Marquita Milkell(name) on [name]  being received from 5 S(unit) for routine progression of care      Report consisted of patient???s Situation, Background, Assessment and   Recommendations(SBAR).     Information from the following report(s) SBAR, Kardex, [facility] and Recent Results was reviewed with the receiving nurse.    Opportunity for questions and clarification was provided.      Assessment completed upon patient???s arrival to unit and care assumed.

## 2011-07-27 NOTE — Other (Signed)
TRANSFER - OUT REPORT:    Verbal report given to Okey Regal, RN(name) on [name]  being transferred to [facility](unit) for routine progression of care       Report consisted of patient???s Situation, Background, Assessment and   Recommendations(SBAR).     Information from the following report(s) Kardex, OR Summary, MAR and Recent Results was reviewed with the receiving nurse.    Opportunity for questions and clarification was provided.

## 2011-07-27 NOTE — Progress Notes (Signed)
[facility]  Progress Note    Patient: [name] Age: 61 y.o. DOB: [birth year] MR#: [id number] SSN: [id number]  Date/Time: 07/27/2011 1:15 PM    Subjective:no nausea or vomiting since admission-only one stool since admission   Has repeatedly demanded pain meds-drinks lots of beer daily      Objective:   VS: BP 115/74   Pulse 76   Temp 97.1 ??F (36.2 ??C)   Resp 18   Ht 5\' 10"  (1.778 m)   Wt 103.284 kg (227 lb 11.2 oz)   BMI 32.67 kg/m2   SpO2 92%   Tmax/24hrs: Temp (24hrs), Avg:97.5 ??F (36.4 ??C), Min:97 ??F (36.1 ??C), Max:98.1 ??F (36.7 ??C)     Input/Output:   Intake/Output Summary (Last 24 hours) at 07/27/11 1315  Last data filed at 07/27/11 0259   Gross per 24 hour   Intake    240 ml   Output      0 ml   Net    240 ml       General:  WDWN WM in NAD  Cardiovascular: RRR  Pulmonary:  clear  GI:  Mild diffuse tenderness  Extremities:  1+ LE edema  Additional:      Labs:    Recent Results (from the past 24 hour(s))   EKG, 12 LEAD, INITIAL    Collection Time    07/26/11  2:51 PM       Component Value Range    Ventricular Rate 78      Atrial Rate 78      P-R Interval 128      QRS Duration 84      Q-T Interval 408      QTC Calculation (Bezet) 465      Calculated P Axis -17      Calculated R Axis 19      Calculated T Axis 29      Diagnosis        Value: Sinus rhythm with occasional premature ventricular complexes      Otherwise normal ECG      When compared with ECG of 22-Dec-2010 16:35,      premature ventricular complexes are now present      Confirmed by Roseanne Reno MD, James ([id number]) on 07/26/2011 9:40:01 PM   CBC WITH AUTOMATED DIFF    Collection Time    07/26/11  2:57 PM       Component Value Range    WBC 4.5 (*) 4.6 - 13.2 K/uL    RBC 4.38 (*) 4.70 - 5.50 M/uL    HGB 15.2  13.0 - 16.0 g/dL    HCT 78.2  95.6 - 21.3 %    MCV 97.9 (*) 74.0 - 97.0 FL    MCH 34.7 (*) 24.0 - 34.0 PG    MCHC 35.4  31.0 - 37.0 g/dL    RDW 08.6  57.8 - 46.9 %    PLATELET 54 (*) 135 - 420 K/uL    MPV 11.1  9.2  - 11.8 FL    NEUTROPHILS 68  40 - 73 %    LYMPHOCYTES 16 (*) 21 - 52 %    MONOCYTES 11 (*) 3 - 10 %    EOSINOPHILS 5  0 - 5 %    BASOPHILS 0  0 - 2 %    ABS. NEUTROPHILS 3.1  1.8 - 8.0 K/UL    ABS. LYMPHOCYTES 0.7 (*) 0.9 - 3.6 K/UL    ABS. MONOCYTES 0.5  0.05 - 1.2 K/UL    ABS. EOSINOPHILS 0.2  0.0 - 0.4 K/UL    ABS. BASOPHILS 0.0  0.0 - 0.1 K/UL    DF AUTOMATED      PLATELET COMMENTS DECREASED PLATELETS      RBC COMMENTS NORMOCYTIC, NORMOCHROMIC     METABOLIC PANEL, COMPREHENSIVE    Collection Time    07/26/11  2:57 PM       Component Value Range    Sodium 141  136 - 145 MMOL/L    Potassium 3.4 (*) 3.5 - 5.5 MMOL/L    Chloride 103  100 - 108 MMOL/L    CO2 31  21 - 32 MMOL/L    Anion gap 7  3.0 - 18 mmol/L    Glucose 175 (*) 74 - 99 MG/DL    BUN 4 (*) 7.0 - 18 MG/DL    Creatinine 1.47  0.6 - 1.3 MG/DL    BUN/Creatinine ratio 4 (*) 12 - 20      GFR est AA >60  >60 ml/min/1.37m2    GFR est non-AA >60  >60 ml/min/1.1m2    Calcium 8.2 (*) 8.5 - 10.1 MG/DL    Bilirubin, total 1.2 (*) 0.2 - 1.0 MG/DL    ALT 78  82.9 - 56.2 U/L    AST 99 (*) 15 - 37 U/L    Alk. phosphatase 130  50 - 136 U/L    Protein, total 7.4  6.4 - 8.2 g/dL    Albumin 3.6  3.4 - 5.0 g/dL    Globulin 3.8  2.0 - 4.0 g/dL    A-G Ratio 0.9  0.8 - 1.7     LIPASE    Collection Time    07/26/11  2:57 PM       Component Value Range    Lipase 172  73 - 393 U/L   MAGNESIUM    Collection Time    07/26/11  2:57 PM       Component Value Range    Magnesium 1.7 (*) 1.8 - 2.4 MG/DL   CARDIAC PANEL,(CK, CKMB & TROPONIN)    Collection Time    07/26/11  2:57 PM       Component Value Range    CK 313 (*) 39 - 308 U/L    CK - MB 8.8 (*) 0.5 - 3.6 ng/ml    CK-MB Index 2.8  0.0 - 4.0 %    Troponin-I, Qt. 0.02  0.0 - 0.045 NG/ML   PROTHROMBIN TIME    Collection Time    07/26/11  2:57 PM       Component Value Range    Prothrombin time 14.3  11.5 - 15.2 sec    INR 1.2  0.8 - 1.2     PTT    Collection Time    07/26/11  2:57 PM       Component Value Range    aPTT 29.9  24.6 - 37.7 SEC    LACTIC ACID, PLASMA    Collection Time    07/26/11  2:57 PM       Component Value Range    Lactic acid 2.6 (*) 0.4 - 2.0 MMOL/L   URINALYSIS W/ RFLX MICROSCOPIC    Collection Time    07/26/11  4:06 PM       Component Value Range    Color YELLOW      Appearance CLEAR      Specific gravity 1.010  1.003 - 1.030  pH 7.0  5.0 - 8.0      Protein NEGATIVE   NEGATIVE MG/DL    Glucose NEGATIVE   NEGATIVE MG/DL    Ketone NEGATIVE   NEGATIVE MG/DL    Bilirubin NEGATIVE   NEGATIVE    Blood TRACE (*) NEGATIVE    Urobilinogen 0.2  0.2 - 1.0 EU/DL    Nitrites NEGATIVE   NEGATIVE    Leukocyte Esterase NEGATIVE   NEGATIVE   URINE MICROSCOPIC ONLY    Collection Time    07/26/11  4:06 PM       Component Value Range    WBC 0 to 1  0 - 4 /HPF    RBC 1 to 2  0 - 5 /HPF    Epithelial cells FEW  0 - 5 /LPF    Bacteria 1+ (*) NEGATIVE /HPF   CARDIAC PANEL,(CK, CKMB & TROPONIN)    Collection Time    07/26/11 11:00 PM       Component Value Range    CK 266  39 - 308 U/L    CK - MB 8.2 (*) 0.5 - 3.6 ng/ml    CK-MB Index 3.1  0.0 - 4.0 %    Troponin-I, Qt. 0.02  0.0 - 0.045 NG/ML   OCCULT BLOOD, STOOL    Collection Time    07/27/11  2:32 AM       Component Value Range    Occult blood, stool NEGATIVE   NEGATIVE   WBC, STOOL    Collection Time    07/27/11  2:32 AM       Component Value Range    White blood cells, stool RARE     CARDIAC PANEL,(CK, CKMB & TROPONIN)    Collection Time    07/27/11  5:00 AM       Component Value Range    CK 264  39 - 308 U/L    CK - MB 9.4 (*) 0.5 - 3.6 ng/ml    CK-MB Index 3.6  0.0 - 4.0 %    Troponin-I, Qt. 0.03  0.0 - 0.045 NG/ML   METABOLIC PANEL, BASIC    Collection Time    07/27/11  6:00 AM       Component Value Range    Sodium 140  136 - 145 MMOL/L    Potassium 3.5  3.5 - 5.5 MMOL/L    Chloride 106  100 - 108 MMOL/L    CO2 27  21 - 32 MMOL/L    Anion gap 7  3.0 - 18 mmol/L    Glucose 91  74 - 99 MG/DL    BUN 7  7.0 - 18 MG/DL    Creatinine 1.61  0.6 - 1.3 MG/DL    BUN/Creatinine ratio 9 (*) 12 - 20      GFR est AA >60  >60  ml/min/1.83m2    GFR est non-AA >60  >60 ml/min/1.32m2    Calcium 7.8 (*) 8.5 - 10.1 MG/DL   CBC WITH AUTOMATED DIFF    Collection Time    07/27/11  6:00 AM       Component Value Range    WBC 5.0  4.6 - 13.2 K/uL    RBC 4.17 (*) 4.70 - 5.50 M/uL    HGB 14.2  13.0 - 16.0 g/dL    HCT 09.6  04.5 - 40.9 %    MCV 97.4 (*) 74.0 - 97.0 FL    MCH 34.1 (*) 24.0 - 34.0 PG  MCHC 35.0  31.0 - 37.0 g/dL    RDW 16.1 (*) 09.6 - 14.5 %    PLATELET 44 (*) 135 - 420 K/uL    MPV 10.7  9.2 - 11.8 FL    NEUTROPHILS 59  40 - 73 %    LYMPHOCYTES 16 (*) 21 - 52 %    MONOCYTES 14 (*) 3 - 10 %    EOSINOPHILS 10 (*) 0 - 5 %    BASOPHILS 1  0 - 2 %    ABS. NEUTROPHILS 3.0  1.8 - 8.0 K/UL    ABS. LYMPHOCYTES 0.8 (*) 0.9 - 3.6 K/UL    ABS. MONOCYTES 0.7  0.05 - 1.2 K/UL    ABS. EOSINOPHILS 0.5 (*) 0.0 - 0.4 K/UL    ABS. BASOPHILS 0.0  0.0 - 0.06 K/UL    DF AUTOMATED     LACTIC ACID, PLASMA    Collection Time    07/27/11  6:00 AM       Component Value Range    Lactic acid 0.7  0.4 - 2.0 MMOL/L   HEMOGLOBIN A1C    Collection Time    07/27/11  6:30 AM       Component Value Range    Hemoglobin A1c 5.2  4.2 - 6.3 %     Additional Data Reviewed:      Assessment/Plan:     Patient Active Problem List   Diagnoses Code   ??? Other and unspecified noninfectious gastroenteritis and colitis 558.9   ??? Alcoholic cirrhosis of liver 571.2     Plan:    1. Change pain meds to po  2.change abx to po Flagyl  3.advance diet  4.watch for ETOH withdrawal-encourage cessation  5.DC IV fluids  Additional Notes:      Case discussed with:  [] Patient  [] Family  [] Nursing  [] Case Management  DVT Prophylaxis:  [] Lovenox  [] Hep SQ  [] SCDs  [] Coumadin   [] On Heparin gtt  Signed By: Hazle Quant, MD     July 27, 2011 1:15 PM

## 2011-07-28 LAB — METABOLIC PANEL, COMPREHENSIVE
A-G Ratio: 0.9 (ref 0.8–1.7)
ALT (SGPT): 83 U/L — ABNORMAL HIGH (ref 12.0–78.0)
AST (SGOT): 118 U/L — ABNORMAL HIGH (ref 15–37)
Albumin: 3 g/dL — ABNORMAL LOW (ref 3.4–5.0)
Alk. phosphatase: 117 U/L (ref 50–136)
Anion gap: 5 mmol/L (ref 3.0–18)
BUN/Creatinine ratio: 9 — ABNORMAL LOW (ref 12–20)
BUN: 7 MG/DL (ref 7.0–18)
Bilirubin, total: 1 MG/DL (ref 0.2–1.0)
CO2: 33 MMOL/L — ABNORMAL HIGH (ref 21–32)
Calcium: 7.6 MG/DL — ABNORMAL LOW (ref 8.5–10.1)
Chloride: 104 MMOL/L (ref 100–108)
Creatinine: 0.81 MG/DL (ref 0.6–1.3)
GFR est AA: >60 mL/min/{1.73_m2} (ref >60)
GFR est non-AA: >60 mL/min/{1.73_m2} (ref >60)
Globulin: 3.4 g/dL (ref 2.0–4.0)
Glucose: 74 MG/DL (ref 74–99)
Potassium: 3.9 MMOL/L (ref 3.5–5.5)
Protein, total: 6.4 g/dL (ref 6.4–8.2)
Sodium: 142 MMOL/L (ref 136–145)

## 2011-07-28 LAB — CBC WITH AUTOMATED DIFF
ABS. BASOPHILS: 0 10*3/uL (ref 0.0–0.06)
ABS. EOSINOPHILS: 0.4 10*3/uL (ref 0.0–0.4)
ABS. LYMPHOCYTES: 0.6 10*3/uL — ABNORMAL LOW (ref 0.9–3.6)
ABS. MONOCYTES: 0.5 10*3/uL (ref 0.05–1.2)
ABS. NEUTROPHILS: 1.9 10*3/uL (ref 1.8–8.0)
BASOPHILS: 1 % (ref 0–2)
EOSINOPHILS: 12 % — ABNORMAL HIGH (ref 0–5)
HCT: 37.9 % (ref 36.0–48.0)
HGB: 13.2 g/dL (ref 13.0–16.0)
LYMPHOCYTES: 18 % — ABNORMAL LOW (ref 21–52)
MCH: 33.8 PG (ref 24.0–34.0)
MCHC: 34.8 g/dL (ref 31.0–37.0)
MCV: 96.9 FL (ref 74.0–97.0)
MONOCYTES: 15 % — ABNORMAL HIGH (ref 3–10)
MPV: 11.2 FL (ref 9.2–11.8)
NEUTROPHILS: 54 % (ref 40–73)
PLATELET: 38 10*3/uL — ABNORMAL LOW (ref 135–420)
RBC: 3.91 M/uL — ABNORMAL LOW (ref 4.70–5.50)
RDW: 14.4 % (ref 11.6–14.5)
WBC: 3.4 10*3/uL — ABNORMAL LOW (ref 4.6–13.2)

## 2011-07-28 LAB — C DIFFICILE TOXIN A & B BY EIA: C. difficile toxin A&B: NEGATIVE

## 2011-07-28 LAB — LACTIC ACID: Lactic acid: 0.7 MMOL/L (ref 0.4–2.0)

## 2011-07-28 LAB — MAGNESIUM: Magnesium: 1.7 MG/DL — ABNORMAL LOW (ref 1.8–2.4)

## 2011-07-28 MED ORDER — METRONIDAZOLE 500 MG TAB
500 mg | ORAL_TABLET | Freq: Three times a day (TID) | ORAL | Status: DC
Start: 2011-07-28 — End: 2011-12-25

## 2011-07-28 MED ORDER — HYDROCODONE-ACETAMINOPHEN 10 MG-325 MG TAB
10-325 mg | ORAL_TABLET | ORAL | Status: DC | PRN
Start: 2011-07-28 — End: 2011-12-25

## 2011-07-28 MED ADMIN — HYDROcodone-acetaminophen (NORCO) 10-325 mg tablet 1 Tab: ORAL | @ 07:00:00 | NDC 68084035311

## 2011-07-28 MED ADMIN — magnesium sulfate 2 g/50 ml IVPB (premix or compounded): INTRAVENOUS | @ 15:00:00 | NDC 00409672924

## 2011-07-28 MED ADMIN — metroNIDAZOLE (FLAGYL) tablet 500 mg: ORAL | @ 13:00:00 | NDC 50111033302

## 2011-07-28 MED ADMIN — sodium chloride (NS) flush 5-10 mL: INTRAVENOUS | @ 10:00:00 | NDC 87701099893

## 2011-07-28 MED ADMIN — HYDROcodone-acetaminophen (NORCO) 10-325 mg tablet 1 Tab: ORAL | @ 10:00:00 | NDC 68084035311

## 2011-07-28 MED ADMIN — sodium chloride (NS) flush 5-10 mL: INTRAVENOUS | @ 04:00:00 | NDC 87701099893

## 2011-07-28 MED ADMIN — zolpidem (AMBIEN) tablet 5 mg: ORAL | @ 04:00:00 | NDC 68084022511

## 2011-07-28 MED ADMIN — HYDROcodone-acetaminophen (NORCO) 10-325 mg tablet 1 Tab: ORAL | @ 03:00:00 | NDC 68084035311

## 2011-07-28 MED ADMIN — metroNIDAZOLE (FLAGYL) tablet 500 mg: ORAL | @ 02:00:00 | NDC 50111033302

## 2011-07-28 MED ADMIN — HYDROcodone-acetaminophen (NORCO) 10-325 mg tablet 1 Tab: ORAL | @ 15:00:00 | NDC 68084035311

## 2011-07-28 MED FILL — HYDROCODONE-ACETAMINOPHEN 10 MG-325 MG TAB: 10-325 mg | ORAL | Qty: 1

## 2011-07-28 MED FILL — METRONIDAZOLE 250 MG TAB: 250 mg | ORAL | Qty: 2

## 2011-07-28 MED FILL — MAGNESIUM SULFATE 2 GRAM/50 ML IVPB: 2 gram/50 mL (4 %) | INTRAVENOUS | Qty: 50

## 2011-07-28 MED FILL — ZOLPIDEM 5 MG TAB: 5 mg | ORAL | Qty: 1

## 2011-07-28 NOTE — Progress Notes (Signed)
Problem: Pain  Goal: *Control of Pain  Outcome: Progressing Towards Goal  Pain Assessment  Pain Intensity 1: 5 (07/28/11 0944)  Pain Location 1: Abdomen  Pain Intervention(s) 1: Relaxation technique  Patient Stated Pain Goal: 4

## 2011-07-28 NOTE — Progress Notes (Signed)
Client being discharged to home. Alert and oriented x 4. No acute distress. Vital signs stable. All applicable lines removed without incident. Discharge instructions given; Verbalized understanding of discharge instructions.

## 2011-07-28 NOTE — Other (Signed)
Bedside and Verbal shift change report given to Ky'arra RN (oncoming nurse) by Rozetta Nunnery RN (offgoing nurse).  Report given with SBAR, Kardex, Intake/Output and Recent Results.

## 2011-07-28 NOTE — Progress Notes (Signed)
[facility]ist Group  Progress Note    Patient: [name] Age: 61 y.o. DOB: 1950-06-23 MR#: [id number] SSN: [id number]  Date/Time: 07/28/2011 1:15 PM    Subjective:awake and alert-no nausea and vomiting-looks comfortable-only one stool today     Complains of abdominal pain but ate breakfast    Objective:   VS: BP 105/68   Pulse 74   Temp 97 ??F (36.1 ??C)   Resp 18   Ht 5\' 10"  (1.778 m)   Wt 103.284 kg (227 lb 11.2 oz)   BMI 32.67 kg/m2   SpO2 92%   Tmax/24hrs: Temp (24hrs), Avg:96.9 ??F (36.1 ??C), Min:96.8 ??F (36 ??C), Max:97 ??F (36.1 ??C)     Input/Output:     Intake/Output Summary (Last 24 hours) at 07/28/11 0926  Last data filed at 07/28/11 0700   Gross per 24 hour   Intake    940 ml   Output    350 ml   Net    590 ml       General:  WDWN WM in NAD  Cardiovascular: RRR  Pulmonary:  clear  GI:  Mild diffuse tenderness  Extremities:  1+ LE edema  Additional:      Labs:    Recent Results (from the past 24 hour(s))   CBC WITH AUTOMATED DIFF    Collection Time    07/27/11  4:30 PM       Component Value Range    WBC 3.4 (*) 4.6 - 13.2 K/uL    RBC 3.79 (*) 4.70 - 5.50 M/uL    HGB 12.8 (*) 13.0 - 16.0 g/dL    HCT 52.8  41.3 - 24.4 %    MCV 98.4 (*) 74.0 - 97.0 FL    MCH 33.8  24.0 - 34.0 PG    MCHC 34.3  31.0 - 37.0 g/dL    RDW 01.0 (*) 27.2 - 14.5 %    PLATELET 38 (*) 135 - 420 K/uL    MPV 10.9  9.2 - 11.8 FL    NEUTROPHILS 56  42 - 75 %    BANDS 1  0 - 5 %    LYMPHOCYTES 20  20 - 51 %    MONOCYTES 15 (*) 2 - 9 %    EOSINOPHILS 7 (*) 0 - 5 %    BASOPHILS 0  0 - 3 %    PROMYELOCYTES 1 (*) 0 %    ABS. NEUTROPHILS 1.9  1.8 - 8.0 K/UL    ABS. LYMPHOCYTES 0.7 (*) 0.8 - 3.5 K/UL    ABS. MONOCYTES 0.5  0 - 1.0 K/UL    ABS. EOSINOPHILS 0.2  0.0 - 0.4 K/UL    ABS. BASOPHILS 0.0  0.0 - 0.06 K/UL    DF MANUAL      PLATELET COMMENTS DECREASED PLATELETS      RBC COMMENTS 1+ MACROCYTOSIS     LACTIC ACID, PLASMA    Collection Time    07/28/11  2:05 AM       Component Value Range    Lactic acid 0.7  0.4  - 2.0 MMOL/L   CBC WITH AUTOMATED DIFF    Collection Time    07/28/11  3:02 AM       Component Value Range    WBC 3.4 (*) 4.6 - 13.2 K/uL    RBC 3.91 (*) 4.70 - 5.50 M/uL    HGB 13.2  13.0 - 16.0 g/dL    HCT 37.9  36.0 - 48.0 %    MCV 96.9  74.0 - 97.0 FL    MCH 33.8  24.0 - 34.0 PG    MCHC 34.8  31.0 - 37.0 g/dL    RDW 47.8  29.5 - 62.1 %    PLATELET 38 (*) 135 - 420 K/uL    MPV 11.2  9.2 - 11.8 FL    NEUTROPHILS 54  40 - 73 %    LYMPHOCYTES 18 (*) 21 - 52 %    MONOCYTES 15 (*) 3 - 10 %    EOSINOPHILS 12 (*) 0 - 5 %    BASOPHILS 1  0 - 2 %    ABS. NEUTROPHILS 1.9  1.8 - 8.0 K/UL    ABS. LYMPHOCYTES 0.6 (*) 0.9 - 3.6 K/UL    ABS. MONOCYTES 0.5  0.05 - 1.2 K/UL    ABS. EOSINOPHILS 0.4  0.0 - 0.4 K/UL    ABS. BASOPHILS 0.0  0.0 - 0.06 K/UL    DF AUTOMATED     METABOLIC PANEL, COMPREHENSIVE    Collection Time    07/28/11  3:02 AM       Component Value Range    Sodium 142  136 - 145 MMOL/L    Potassium 3.9  3.5 - 5.5 MMOL/L    Chloride 104  100 - 108 MMOL/L    CO2 33 (*) 21 - 32 MMOL/L    Anion gap 5  3.0 - 18 mmol/L    Glucose 74  74 - 99 MG/DL    BUN 7  7.0 - 18 MG/DL    Creatinine 3.08  0.6 - 1.3 MG/DL    BUN/Creatinine ratio 9 (*) 12 - 20      GFR est AA >60  >60 ml/min/1.36m2    GFR est non-AA >60  >60 ml/min/1.69m2    Calcium 7.6 (*) 8.5 - 10.1 MG/DL    Bilirubin, total 1.0  0.2 - 1.0 MG/DL    ALT 83 (*) 65.7 - 84.6 U/L    AST 118 (*) 15 - 37 U/L    Alk. phosphatase 117  50 - 136 U/L    Protein, total 6.4  6.4 - 8.2 g/dL    Albumin 3.0 (*) 3.4 - 5.0 g/dL    Globulin 3.4  2.0 - 4.0 g/dL    A-G Ratio 0.9  0.8 - 1.7     MAGNESIUM    Collection Time    07/28/11  3:02 AM       Component Value Range    Magnesium 1.7 (*) 1.8 - 2.4 MG/DL     Additional Data Reviewed:      Assessment/Plan:     Patient Active Problem List   Diagnoses Code   ??? Other and unspecified noninfectious gastroenteritis and colitis 558.9   ??? Alcoholic cirrhosis of liver 571.2     Plan:    1. DC home with oral antibioitics  2.continue to encourage ETOH  cessation  Additional Notes:      Case discussed with:  [x] Patient  [] Family  [x] Nursing  [] Case Management  DVT Prophylaxis:  [] Lovenox  [] Hep SQ  [] SCDs  [] Coumadin   [] On Heparin gtt  Signed By: Hazle Quant, MD     July 28, 2011 1:15 PM

## 2011-07-28 NOTE — Discharge Summary (Signed)
[facility] Hospitalist Group  Discharge Summary       Patient: [name] Age: 61 y.o. DOB: 1950-05-21 MR#: [id number] SSN: [id number]  PCP on record: None  Admit date: 07/26/2011  Discharge date: 07/28/2011    Consults:  [place]  -   Procedures:Abdominal Willeen Cass CT-Nonspecific Colitis/Cirrhosis  -     \\  -    Discharge Diagnoses:                                           Patient Active Problem List   Diagnoses Code   ??? Other and unspecified noninfectious gastroenteritis and colitis 558.9   ??? Alcoholic cirrhosis of liver 571.2       Hospital Course by Problem   1. Colitis-61 year old WM with history of ETOH abuse admitted with nausea,vomitng abdominal pain and diarrhea. Pt had no further nausea or vomiting upon admission. He had only 2 stools after admission. Lactic acid quickly normalized. Pt was initially started on IV antibioitcs-these were subsequently changed to po Flagyl only. Pt did exhibit drug seeking behavior. Although he was eating well, and had no outward signs of pain, he continued to demand IV narcotic medication. He was discharged in stable condition on oral antibiotics with strong instructions to avoid alcohol. No signs of alcohol withdrawal were seen while patient was in hospital.            Today's examination of the patient revealed:     Subjective:denies nausea,vomiting or diarrhea     Objective:   VS: BP 105/68   Pulse 74   Temp 97 ??F (36.1 ??C)   Resp 18   Ht 5\' 10"  (1.778 m)   Wt 103.284 kg (227 lb 11.2 oz)   BMI 32.67 kg/m2   SpO2 92%   Tmax/24hrs: Temp (24hrs), Avg:96.9 ??F (36.1 ??C), Min:96.8 ??F (36 ??C), Max:97 ??F (36.1 ??C)     Input/Output:   Intake/Output Summary (Last 24 hours) at 07/28/11 0928  Last data filed at 07/28/11 0700   Gross per 24 hour   Intake    940 ml   Output    350 ml   Net    590 ml       General:  Obese WM in NAD  Cardiovascular:  RRR  Pulmonary:  clear  GI:  Soft mild diffuse tenderness  Extremities:  1+ LE edema  Additional:      Labs:     Recent Results (from the past 24 hour(s))   CBC WITH AUTOMATED DIFF    Collection Time    07/27/11  4:30 PM       Component Value Range    WBC 3.4 (*) 4.6 - 13.2 K/uL    RBC 3.79 (*) 4.70 - 5.50 M/uL    HGB 12.8 (*) 13.0 - 16.0 g/dL    HCT 78.2  95.6 - 21.3 %    MCV 98.4 (*) 74.0 - 97.0 FL    MCH 33.8  24.0 - 34.0 PG    MCHC 34.3  31.0 - 37.0 g/dL    RDW 08.6 (*) 57.8 - 14.5 %    PLATELET 38 (*) 135 - 420 K/uL    MPV 10.9  9.2 - 11.8 FL    NEUTROPHILS 56  42 - 75 %    BANDS 1  0 - 5 %  LYMPHOCYTES 20  20 - 51 %    MONOCYTES 15 (*) 2 - 9 %    EOSINOPHILS 7 (*) 0 - 5 %    BASOPHILS 0  0 - 3 %    PROMYELOCYTES 1 (*) 0 %    ABS. NEUTROPHILS 1.9  1.8 - 8.0 K/UL    ABS. LYMPHOCYTES 0.7 (*) 0.8 - 3.5 K/UL    ABS. MONOCYTES 0.5  0 - 1.0 K/UL    ABS. EOSINOPHILS 0.2  0.0 - 0.4 K/UL    ABS. BASOPHILS 0.0  0.0 - 0.06 K/UL    DF MANUAL      PLATELET COMMENTS DECREASED PLATELETS      RBC COMMENTS 1+ MACROCYTOSIS     LACTIC ACID, PLASMA    Collection Time    07/28/11  2:05 AM       Component Value Range    Lactic acid 0.7  0.4 - 2.0 MMOL/L   CBC WITH AUTOMATED DIFF    Collection Time    07/28/11  3:02 AM       Component Value Range    WBC 3.4 (*) 4.6 - 13.2 K/uL    RBC 3.91 (*) 4.70 - 5.50 M/uL    HGB 13.2  13.0 - 16.0 g/dL    HCT 16.1  09.6 - 04.5 %    MCV 96.9  74.0 - 97.0 FL    MCH 33.8  24.0 - 34.0 PG    MCHC 34.8  31.0 - 37.0 g/dL    RDW 40.9  81.1 - 91.4 %    PLATELET 38 (*) 135 - 420 K/uL    MPV 11.2  9.2 - 11.8 FL    NEUTROPHILS 54  40 - 73 %    LYMPHOCYTES 18 (*) 21 - 52 %    MONOCYTES 15 (*) 3 - 10 %    EOSINOPHILS 12 (*) 0 - 5 %    BASOPHILS 1  0 - 2 %    ABS. NEUTROPHILS 1.9  1.8 - 8.0 K/UL    ABS. LYMPHOCYTES 0.6 (*) 0.9 - 3.6 K/UL    ABS. MONOCYTES 0.5  0.05 - 1.2 K/UL    ABS. EOSINOPHILS 0.4  0.0 - 0.4 K/UL    ABS. BASOPHILS 0.0  0.0 - 0.06 K/UL    DF AUTOMATED     METABOLIC PANEL, COMPREHENSIVE    Collection Time    07/28/11  3:02 AM       Component Value Range    Sodium 142  136 - 145 MMOL/L    Potassium 3.9  3.5 -  5.5 MMOL/L    Chloride 104  100 - 108 MMOL/L    CO2 33 (*) 21 - 32 MMOL/L    Anion gap 5  3.0 - 18 mmol/L    Glucose 74  74 - 99 MG/DL    BUN 7  7.0 - 18 MG/DL    Creatinine 7.82  0.6 - 1.3 MG/DL    BUN/Creatinine ratio 9 (*) 12 - 20      GFR est AA >60  >60 ml/min/1.65m2    GFR est non-AA >60  >60 ml/min/1.54m2    Calcium 7.6 (*) 8.5 - 10.1 MG/DL    Bilirubin, total 1.0  0.2 - 1.0 MG/DL    ALT 83 (*) 95.6 - 21.3 U/L    AST 118 (*) 15 - 37 U/L    Alk. phosphatase 117  50 - 136 U/L    Protein, total 6.4  6.4 - 8.2 g/dL    Albumin 3.0 (*) 3.4 - 5.0 g/dL    Globulin 3.4  2.0 - 4.0 g/dL    A-G Ratio 0.9  0.8 - 1.7     MAGNESIUM    Collection Time    07/28/11  3:02 AM       Component Value Range    Magnesium 1.7 (*) 1.8 - 2.4 MG/DL     Additional Data Reviewed:     Condition:   Disposition:    [x] Home   [] Home with Home Health   [] SNF/NH   [] Rehab   [] Home with family   [] Alternate Facility:____________________      Discharge Medications:     Current Discharge Medication List      START taking these medications    Details   HYDROcodone-acetaminophen (NORCO) 10-325 mg tablet Take 1 Tab by mouth every four (4) hours as needed.  Qty: 10 Tab, Refills: 0      metroNIDAZOLE (FLAGYL) 500 mg tablet Take 1 Tab by mouth three (3) times daily.  Qty: 30 Tab, Refills: 0         STOP taking these medications       traMADol (ULTRAM) 50 mg tablet Comments:   Reason for Stopping:                 Follow-up Appointments:   1. Your PCP: [facility] within 7 days            >30 minutes spent coordinating this discharge (review instructions/follow-up, prescriptions, preparing report for sign off)    Signed:  Hazle Quant, MD  07/28/2011  9:28 AM

## 2011-07-29 LAB — HEPATITIS PANEL, ACUTE
Hep B surface Ag Interp.: NEGATIVE
Hep C virus Ab Interp.: POSITIVE — AB
Hepatitis A, IgM: NEGATIVE
Hepatitis B core, IgM: NEGATIVE
Hepatitis B surface Ag: <0.1 Index (ref <1.00)
Hepatitis C virus Ab: >11 Index — ABNORMAL HIGH (ref <0.80)

## 2011-07-29 LAB — OVA & PARASITES, STOOL

## 2011-07-29 LAB — OVA & PARASITES, STOOL, REFLEX

## 2011-07-30 LAB — CULTURE, STOOL

## 2011-08-04 LAB — METABOLIC PANEL, COMPREHENSIVE
A-G Ratio: 0.9 (ref 0.8–1.7)
ALT (SGPT): 114 U/L — ABNORMAL HIGH (ref 12.0–78.0)
AST (SGOT): 141 U/L — ABNORMAL HIGH (ref 15–37)
Albumin: 3.9 g/dL (ref 3.4–5.0)
Alk. phosphatase: 146 U/L — ABNORMAL HIGH (ref 50–136)
Anion gap: 8 mmol/L (ref 3.0–18)
BUN/Creatinine ratio: 11 — ABNORMAL LOW (ref 12–20)
BUN: 8 MG/DL (ref 7.0–18)
Bilirubin, total: 1.1 MG/DL — ABNORMAL HIGH (ref 0.2–1.0)
CO2: 30 MMOL/L (ref 21–32)
Calcium: 8.6 MG/DL (ref 8.5–10.1)
Chloride: 105 MMOL/L (ref 100–108)
Creatinine: 0.73 MG/DL (ref 0.6–1.3)
GFR est AA: >60 mL/min/{1.73_m2} (ref >60)
GFR est non-AA: >60 mL/min/{1.73_m2} (ref >60)
Globulin: 4.4 g/dL — ABNORMAL HIGH (ref 2.0–4.0)
Glucose: 82 MG/DL (ref 74–99)
Potassium: 3.9 MMOL/L (ref 3.5–5.5)
Protein, total: 8.3 g/dL — ABNORMAL HIGH (ref 6.4–8.2)
Sodium: 143 MMOL/L (ref 136–145)

## 2011-08-04 LAB — CBC WITH AUTOMATED DIFF
ABS. BASOPHILS: 0 10*3/uL (ref 0.0–0.1)
ABS. EOSINOPHILS: 0.3 10*3/uL (ref 0.0–0.4)
ABS. LYMPHOCYTES: 0.8 10*3/uL — ABNORMAL LOW (ref 0.9–3.6)
ABS. MONOCYTES: 1 10*3/uL (ref 0.05–1.2)
ABS. NEUTROPHILS: 3.2 10*3/uL (ref 1.8–8.0)
BASOPHILS: 1 % (ref 0–2)
EOSINOPHILS: 5 % (ref 0–5)
HCT: 47.2 % (ref 36.0–48.0)
HGB: 16.6 g/dL — ABNORMAL HIGH (ref 13.0–16.0)
LYMPHOCYTES: 15 % — ABNORMAL LOW (ref 21–52)
MCH: 34.7 PG — ABNORMAL HIGH (ref 24.0–34.0)
MCHC: 35.2 g/dL (ref 31.0–37.0)
MCV: 98.5 FL — ABNORMAL HIGH (ref 74.0–97.0)
MONOCYTES: 19 % — ABNORMAL HIGH (ref 3–10)
MPV: 12.4 FL — ABNORMAL HIGH (ref 9.2–11.8)
NEUTROPHILS: 60 % (ref 40–73)
PLATELET: 50 10*3/uL — ABNORMAL LOW (ref 135–420)
RBC: 4.79 M/uL (ref 4.70–5.50)
RDW: 14.2 % (ref 11.6–14.5)
WBC: 5.3 10*3/uL (ref 4.6–13.2)

## 2011-08-04 LAB — LIPASE: Lipase: 257 U/L (ref 73–393)

## 2011-08-04 MED ORDER — HYDROCODONE-ACETAMINOPHEN 5 MG-325 MG TAB
5-325 mg | ORAL_TABLET | ORAL | Status: DC | PRN
Start: 2011-08-04 — End: 2011-12-25

## 2011-08-04 MED ORDER — HYDROCODONE-ACETAMINOPHEN 5 MG-325 MG TAB
5-325 mg | ORAL | Status: AC
Start: 2011-08-04 — End: 2011-08-04
  Administered 2011-08-04: 20:00:00 via ORAL

## 2011-08-04 MED FILL — HYDROCODONE-ACETAMINOPHEN 5 MG-325 MG TAB: 5-325 mg | ORAL | Qty: 1

## 2011-08-04 NOTE — ED Provider Notes (Addendum)
HPI Comments: 61 year old male with hx of hep c, nonspecific colitis negative for c diff. Presents with diffuse intermittent abd pain c/w the pain that he got admitted for. Pt notes he is now out of pain meds but has his abx. Pain is improved from admission, but pt would like pain meds to help with follow up.      Patient is a 61 y.o. male presenting with abdominal pain.   Abdominal Pain          Past Medical History   Diagnosis Date   ??? Hypertension    ??? Other ill-defined conditions      sickle cell        No past surgical history on file.      Family History   Problem Relation Age of Onset   ??? Heart Disease Father 54     MI   ??? Heart Disease Brother         History     Social History   ??? Marital Status: DIVORCED     Spouse Name: N/A     Number of Children: N/A   ??? Years of Education: N/A     Occupational History   ??? Not on file.     Social History Main Topics   ??? Smoking status: Former Smoker   ??? Smokeless tobacco: Not on file   ??? Alcohol Use: Yes   ??? Drug Use: No   ??? Sexually Active: Not on file     Other Topics Concern   ??? Not on file     Social History Narrative   ??? No narrative on file                  ALLERGIES: Review of patient's allergies indicates no known allergies.      Review of Systems   Gastrointestinal: Positive for abdominal pain.   All other systems reviewed and are negative.        Filed Vitals:    08/04/11 1343   BP: 148/101   Pulse: 95   Temp: 96.8 ??F (36 ??C)   Resp: 16   SpO2: 98%            Physical Exam   Constitutional: He appears well-developed.   HENT:   Head: Normocephalic.   Eyes: Pupils are equal, round, and reactive to light.   Neck: Normal range of motion.   Cardiovascular: Normal rate.    Pulmonary/Chest: Effort normal.   Abdominal: Soft.   Musculoskeletal: Normal range of motion.   Neurological: He is alert.   Skin: Skin is warm.        MDM     Differential Diagnosis; Clinical Impression; Plan:     Pt with recurrent abd pain, improved from that which got him admitted but worse  since he ran out of pain meds. No n/v. Exam benign, will check labs and re-eval.     Labs noted. Will d/c with pain meds and keep referral to GI.       Procedures

## 2011-08-04 NOTE — ED Notes (Signed)
Pt denies any questions, ambulated independently from ER.

## 2011-08-04 NOTE — ED Notes (Signed)
"  I am having mild to severe abdominal pain, I need pain medication."

## 2011-12-23 NOTE — Progress Notes (Signed)
Patient is in the office today to establish care. Patient states this is also a ED follow up.
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
## 2011-12-25 NOTE — Patient Instructions (Signed)
Cirrhosis: After Your Visit  Your Care Instructions  Cirrhosis occurs when healthy liver tissue becomes scarred. This keeps the liver from working well. Cirrhosis usually happens after years of liver inflammation. It is most often caused by long-term alcohol abuse or infection with the hepatitis B or hepatitis C virus. Some medicines, too much fat in the liver, some conditions passed down in families, and other disorders also can cause cirrhosis. Sometimes no reason for the cirrhosis can be found.  Treatment cannot completely fix liver damage, but you may be able to slow or prevent more liver damage if you do not drink alcohol or use drugs that harm your liver.  Follow-up care is a key part of your treatment and safety. Be sure to make and go to all appointments, and call your doctor if you are having problems. It???s also a good idea to know your test results and keep a list of the medicines you take.  How can you care for yourself at home?  ?? Do not drink any alcohol. It can harm your liver. Talk to your doctor if you need help to stop drinking.  ?? Take your medicines exactly as prescribed. Call your doctor if you think you are having a problem with your medicine.  ?? Do not take any other medicines, including over-the-counter medicines and herbal products, without talking to your doctor first.  ?? Be careful taking acetaminophen (Tylenol), ibuprofen (Advil, Motrin), or naproxen (Aleve). These can sometimes cause more liver damage. Talk with your doctor if you are not sure whether these medicines are safe for you to take.  ?? Eat a low-salt diet if your cirrhosis is causing fluid buildup in your body. Use less salt when you cook and at the table. Do not eat fast foods or snack foods with a lot of salt. Fluid buildup in your belly, legs, and chest can cause serious problems.  ?? Follow your doctor's instructions for eating carbohydrate, protein, and fat in the right amounts for you.  ?? Limit your fluid intake if your  doctor has told you to.  ?? If your doctor recommends it, get more exercise. Walking is a good choice. Bit by bit, increase the amount you walk every day. Try for at least 30 minutes on most days of the week. You also may want to swim, bike, or do other activities.  When should you call for help?  Call 911 anytime you think you may need emergency care. For example, call if:  ?? You vomit blood or what looks like coffee grounds.  ?? You pass maroon or very bloody stools.  ?? You passed out (lost consciousness).  ?? You feel very confused and disoriented. This is a sign of brain problems from severe liver damage.  ?? You have severe trouble breathing.  Call your doctor now or seek immediate medical care if:  ?? You have mental changes, such as sleepiness, confusion, moodiness, or a poor memory.  ?? You have new belly pain or swelling.  ?? You have many nosebleeds or are bleeding from the gums or rectum.  ?? You have a fever.  ?? Your stools are black and tarlike or have streaks of blood.  Watch closely for changes in your health, and be sure to contact your doctor if:  ?? Your skin or the whites of your eyes turn yellow, or they are more yellow than they were before.  ?? You cannot control itching.    Where can you learn more?      Go to http://www.healthwise.net/[facility]   Enter M412 in the search box to learn more about "Cirrhosis: After Your Visit."    ?? 2006-2013 Healthwise, Inc[employer]. Care instructions adapted under license by [facility] (which disclaims liability or warranty for this information). This care instruction is for use with your licensed healthcare professional. If you have questions about a medical condition or this instruction, always ask your healthcare professional. Healthwise, Incorporated disclaims any warranty or liability for your use of this information.  Content Version: 9.8.193578; Last Revised: December 03, 2009

## 2012-03-10 ENCOUNTER — Inpatient Hospital Stay
Admit: 2012-03-10 | Discharge: 2012-03-14 | Disposition: A | Discharge status: Home / Self Care | Payer: Self-pay | Attending: Internal Medicine | Admitting: Internal Medicine

## 2012-03-10 DIAGNOSIS — K2991 Gastroduodenitis, unspecified, with bleeding: Secondary | ICD-10-CM

## 2012-03-10 DIAGNOSIS — K2971 Gastritis, unspecified, with bleeding: Secondary | ICD-10-CM

## 2012-03-10 NOTE — ED Notes (Signed)
Pt continues to c/o of generalized body pain and request pain medications.

## 2012-03-10 NOTE — Other (Signed)
TRANSFER - OUT REPORT:    Verbal report given to Kim, RN(name) on [name]  being transferred to 3s(unit) for routine progression of care       Report consisted of patient???s Situation, Background, Assessment and   Recommendations(SBAR).     Information from the following report(s) SBAR, ED Summary and MAR was reviewed with the receiving nurse.    Opportunity for questions and clarification was provided.

## 2012-03-10 NOTE — ED Notes (Signed)
Bedside shift change report given to Ivan Croft, RN (oncoming nurse) by Aris Georgia, RN (offgoing nurse).  Report given with ED Summary.

## 2012-03-10 NOTE — ED Notes (Signed)
Pt c/o severe upper abdominal pain and is requesting pain medication. Will follow up with MD for further orders.

## 2012-03-10 NOTE — ED Notes (Signed)
Received verbal report from [place], [place] and assumed care of pt at this time. Pt is currently oriented to person and place. Cardiac and pulse ox monitor in place with audible alarms. Roommate remains at bedside. Will continue to monitor pt closely.

## 2012-03-10 NOTE — ED Notes (Signed)
Consent for blood transfusion has been signed by patient.
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
## 2012-03-10 NOTE — ED Provider Notes (Signed)
HPI Comments: Pt is a 62yo male with a h/o alcohol abuse, chronic pain, liver disease, HTN presents after returning home from a bus trip from [place].  Pt was found last night by his significant other to be drowsy.  He has been complaining of dark stools for a week.  Pt notes he has been lightheaded.  He denies taking NSAIDs, pain medication, or alcohol use.  Pt notes he had an admission in the past for GI bleeding.  Pt friend notes he was admitted to a hospital up [place] but does not know why.  Pt has not been eating well.      Patient is a 62 y.o. male presenting with altered mental status.   Altered mental status   Pertinent negatives include no weakness.        Past Medical History   Diagnosis Date   ??? Hypertension    ??? Other ill-defined conditions      sickle cell        No past surgical history on file.      Family History   Problem Relation Age of Onset   ??? Heart Disease Father 49     MI   ??? Heart Disease Brother         History     Social History   ??? Marital Status: MARRIED     Spouse Name: N/A     Number of Children: N/A   ??? Years of Education: N/A     Occupational History   ??? Not on file.     Social History Main Topics   ??? Smoking status: Former Smoker   ??? Smokeless tobacco: Not on file   ??? Alcohol Use: Yes   ??? Drug Use: No   ??? Sexually Active: Not on file     Other Topics Concern   ??? Not on file     Social History Narrative   ??? No narrative on file                  ALLERGIES: Review of patient's allergies indicates no known allergies.      Review of Systems   Constitutional: Negative for fever, chills, fatigue and unexpected weight change.   HENT: Negative for congestion and rhinorrhea.    Respiratory: Negative for chest tightness and shortness of breath.    Cardiovascular: Negative for chest pain, palpitations and leg swelling.   Gastrointestinal: Positive for nausea, abdominal pain, diarrhea and blood in stool. Negative for vomiting.   Genitourinary: Negative for dysuria.   Musculoskeletal: Negative  for back pain.   Skin: Negative for rash.   Neurological: Positive for light-headedness. Negative for dizziness and weakness.   Psychiatric/Behavioral: Positive for altered mental status. The patient is not nervous/anxious.        Filed Vitals:    03/10/12 1852   BP: 143/110   Pulse: 105   Temp: 97.2 ??F (36.2 ??C)   Resp: 17            Physical Exam   Nursing note and vitals reviewed.  Constitutional: He is oriented to person, place, and time. He appears well-developed and well-nourished. No distress.   Globally weak, pallor noted    HENT:   Head: Normocephalic and atraumatic.   Right Ear: External ear normal.   Left Ear: External ear normal.   Nose: Nose normal.   Mouth/Throat: Oropharynx is clear and moist.   Eyes: EOM are normal. Pupils are equal, round, and reactive to light.  No scleral icterus.   Conjunctival pallor    Neck: Normal range of motion. Neck supple. No JVD present. No tracheal deviation present. No thyromegaly present.   Cardiovascular: Regular rhythm, normal heart sounds and intact distal pulses.  Exam reveals no gallop and no friction rub.    No murmur heard.  Tachy    Pulmonary/Chest: Effort normal and breath sounds normal. He exhibits no tenderness.   Abdominal: Bowel sounds are normal. He exhibits distension. There is tenderness. There is no rebound and no guarding.   Mild tenderness without guarding   Genitourinary: Guaiac positive stool.   Melena noted, strongly heme positive    Musculoskeletal: Normal range of motion. He exhibits no edema and no tenderness.   Lymphadenopathy:     He has no cervical adenopathy.   Neurological: He is alert and oriented to person, place, and time. He has normal reflexes. No cranial nerve deficit. Coordination normal.   No sensory loss, Gait normal, Motor 5/5   Skin: Skin is dry.   Cool   Psychiatric:   Poor insight to his health         MDM     Differential Diagnosis; Clinical Impression; Plan:     Pt is a 62yo male presenting with weakness and melena.  Suspect  upper Gi bleeding.  Will trend his labs, ammonia, start protonix then reevaluate. Renee Rival, MD 7:32 PM      Critical Care:   Total time providing critical care:  30-74 minutes (Excluding all other billable procedures.)      Procedures    Pt is anemic and encephalopathic.  Will discuss with GI and plan for admission.  Pt BP remains reassuring. Renee Rival, MD 7:59 PM    Discussed the case with Dr. Hart Rochester.  He recommends admission and asks for the pt to get 1 unit prbc, lactulose, xifaxin, octreotide, protonix, and will follow the pt during admission. Renee Rival, MD 8:26 PM    CXR poor inspiratory effort     Discussed with Dr. Earlie Lou and agrees with ICU admission.Renee Rival, MD 9:52 PM

## 2012-03-10 NOTE — Other (Signed)
Blood documentation audit done. For the following unit, B 13558, the transfusion completed date/time 03/10/12 at 2356, and "no" entered for transfusion reaction per protocol.

## 2012-03-11 LAB — ETHYL ALCOHOL: ALCOHOL(ETHYL),SERUM: <3 MG/DL (ref 0–3)

## 2012-03-11 LAB — EKG, 12 LEAD, INITIAL
Atrial Rate: 111 {beats}/min
Calculated P Axis: 44 degrees
Calculated R Axis: 34 degrees
Calculated T Axis: 80 degrees
P-R Interval: 122 ms
Q-T Interval: 354 ms
QRS Duration: 82 ms
QTC Calculation (Bezet): 481 ms
Ventricular Rate: 111 {beats}/min

## 2012-03-11 LAB — DRUG SCREEN, URINE
AMPHETAMINES: NEGATIVE
BARBITURATES: NEGATIVE
BENZODIAZEPINES: NEGATIVE
COCAINE: NEGATIVE
METHADONE: NEGATIVE
OPIATES: NEGATIVE
PCP(PHENCYCLIDINE): NEGATIVE
THC (TH-CANNABINOL): NEGATIVE

## 2012-03-11 LAB — METABOLIC PANEL, COMPREHENSIVE
A-G Ratio: 1 (ref 0.8–1.7)
ALT (SGPT): 66 U/L (ref 12.0–78.0)
AST (SGOT): 83 U/L — ABNORMAL HIGH (ref 15–37)
Albumin: 2.9 g/dL — ABNORMAL LOW (ref 3.4–5.0)
Alk. phosphatase: 96 U/L (ref 50–136)
Anion gap: 6 mmol/L (ref 3.0–18)
BUN/Creatinine ratio: 22 — ABNORMAL HIGH (ref 12–20)
BUN: 21 MG/DL — ABNORMAL HIGH (ref 7.0–18)
Bilirubin, total: 0.7 MG/DL (ref 0.2–1.0)
CO2: 27 MMOL/L (ref 21–32)
Calcium: 7.8 MG/DL — ABNORMAL LOW (ref 8.5–10.1)
Chloride: 108 MMOL/L (ref 100–108)
Creatinine: 0.94 MG/DL (ref 0.6–1.3)
GFR est AA: >60 mL/min/{1.73_m2} (ref >60)
GFR est non-AA: >60 mL/min/{1.73_m2} (ref >60)
Globulin: 3 g/dL (ref 2.0–4.0)
Glucose: 149 MG/DL — ABNORMAL HIGH (ref 74–99)
Potassium: 4 MMOL/L (ref 3.5–5.5)
Protein, total: 5.9 g/dL — ABNORMAL LOW (ref 6.4–8.2)
Sodium: 141 MMOL/L (ref 136–145)

## 2012-03-11 LAB — PHOSPHORUS: Phosphorus: 3 MG/DL (ref 2.5–4.9)

## 2012-03-11 LAB — CBC WITH AUTOMATED DIFF
ABS. BASOPHILS: 0.1 10*3/uL (ref 0.0–0.1)
ABS. EOSINOPHILS: 0.1 10*3/uL (ref 0.0–0.4)
ABS. LYMPHOCYTES: 1 10*3/uL (ref 0.9–3.6)
ABS. MONOCYTES: 1.1 10*3/uL (ref 0.05–1.2)
ABS. NEUTROPHILS: 4.8 10*3/uL (ref 1.8–8.0)
BASOPHILS: 1 % (ref 0–2)
EOSINOPHILS: 2 % (ref 0–5)
HCT: 24.4 % — ABNORMAL LOW (ref 36.0–48.0)
HGB: 7.9 g/dL — ABNORMAL LOW (ref 13.0–16.0)
LYMPHOCYTES: 14 % — ABNORMAL LOW (ref 21–52)
MCH: 27.1 PG (ref 24.0–34.0)
MCHC: 32.4 g/dL (ref 31.0–37.0)
MCV: 83.8 FL (ref 74.0–97.0)
MONOCYTES: 16 % — ABNORMAL HIGH (ref 3–10)
MPV: 11.1 FL (ref 9.2–11.8)
NEUTROPHILS: 67 % (ref 40–73)
PLATELET: 60 10*3/uL — ABNORMAL LOW (ref 135–420)
RBC: 2.91 M/uL — ABNORMAL LOW (ref 4.70–5.50)
RDW: 19.5 % — ABNORMAL HIGH (ref 11.6–14.5)
WBC: 7.1 10*3/uL (ref 4.6–13.2)

## 2012-03-11 LAB — DRUG SCREEN UR - W/ CONFIRM
AMPHETAMINES: NEGATIVE
BARBITURATES: NEGATIVE
BENZODIAZEPINES: NEGATIVE
COCAINE: NEGATIVE
METHADONE: NEGATIVE
OPIATES: NEGATIVE
PCP(PHENCYCLIDINE): NEGATIVE
THC (TH-CANNABINOL): NEGATIVE

## 2012-03-11 LAB — AMMONIA: Ammonia, plasma: 136 umol/L — ABNORMAL HIGH (ref 11–32)

## 2012-03-11 LAB — CBC W/O DIFF
HCT: 24.2 % — ABNORMAL LOW (ref 36.0–48.0)
HGB: 7.7 g/dL — ABNORMAL LOW (ref 13.0–16.0)
MCH: 26.7 PG (ref 24.0–34.0)
MCHC: 31.8 g/dL (ref 31.0–37.0)
MCV: 84 FL (ref 74.0–97.0)
MPV: 11.9 FL — ABNORMAL HIGH (ref 9.2–11.8)
PLATELET: 58 10*3/uL — ABNORMAL LOW (ref 135–420)
RBC: 2.88 M/uL — ABNORMAL LOW (ref 4.70–5.50)
RDW: 19.2 % — ABNORMAL HIGH (ref 11.6–14.5)
WBC: 7.2 10*3/uL (ref 4.6–13.2)

## 2012-03-11 LAB — METABOLIC PANEL, BASIC
Anion gap: 7 mmol/L (ref 3.0–18)
BUN/Creatinine ratio: 26 — ABNORMAL HIGH (ref 12–20)
BUN: 20 MG/DL — ABNORMAL HIGH (ref 7.0–18)
CO2: 25 MMOL/L (ref 21–32)
Calcium: 7.4 MG/DL — ABNORMAL LOW (ref 8.5–10.1)
Chloride: 110 MMOL/L — ABNORMAL HIGH (ref 100–108)
Creatinine: 0.78 MG/DL (ref 0.6–1.3)
GFR est AA: >60 mL/min/{1.73_m2} (ref >60)
GFR est non-AA: >60 mL/min/{1.73_m2} (ref >60)
Glucose: 94 MG/DL (ref 74–99)
Potassium: 3.8 MMOL/L (ref 3.5–5.5)
Sodium: 142 MMOL/L (ref 136–145)

## 2012-03-11 LAB — MRSA SCREEN - PCR (NASAL)

## 2012-03-11 LAB — HGB & HCT
HCT: 24.5 % — ABNORMAL LOW (ref 36.0–48.0)
HGB: 7.7 g/dL — ABNORMAL LOW (ref 13.0–16.0)

## 2012-03-11 LAB — PROTHROMBIN TIME + INR
INR: 1.3 — ABNORMAL HIGH (ref 0.8–1.2)
Prothrombin time: 15.8 s — ABNORMAL HIGH (ref 11.5–15.2)

## 2012-03-11 LAB — PTT: aPTT: 31.4 s (ref 24.6–37.7)

## 2012-03-11 LAB — MAGNESIUM: Magnesium: 2 MG/DL (ref 1.8–2.4)

## 2012-03-11 MED ADMIN — sodium chloride (NS) flush 5-10 mL: INTRAVENOUS | @ 11:00:00 | NDC 87701099893

## 2012-03-11 MED ADMIN — pantoprazole (PROTONIX) injection 80 mg: INTRAVENOUS | @ 01:00:00 | NDC 00008400101

## 2012-03-11 MED ADMIN — 0.9% sodium chloride (MBP/ADV) 0.9 % infusion: @ 05:00:00 | NDC 00338055311

## 2012-03-11 MED ADMIN — LORazepam (ATIVAN) injection 1 mg: INTRAVENOUS | @ 09:00:00 | NDC 00641604401

## 2012-03-11 MED ADMIN — lactulose (CHRONULAC) solution 20 g: ORAL | @ 04:00:00 | NDC 66689003801

## 2012-03-11 MED ADMIN — lactulose (CHRONULAC) solution 20 g: ORAL | @ 23:00:00 | NDC 66689003801

## 2012-03-11 MED ADMIN — dextrose 5% - 0.45% NaCl with KCl 20 mEq/L infusion: INTRAVENOUS | @ 20:00:00 | NDC 00409790209

## 2012-03-11 MED ADMIN — rifaximin (XIFAXAN) tablet 550 mg: ORAL | @ 23:00:00 | NDC 65649030303

## 2012-03-11 MED ADMIN — rifaximin (XIFAXAN) tablet 550 mg: ORAL | @ 16:00:00 | NDC 65649030303

## 2012-03-11 MED ADMIN — thiamine (B-1) 100 mg in 0.9% sodium chloride 50 mL IVPB: INTRAVENOUS | @ 16:00:00 | NDC 63323001302

## 2012-03-11 MED ADMIN — pantoprazole (PROTONIX) 80mg in NS 100 ml infusion: INTRAVENOUS | @ 05:00:00 | NDC 82009001190

## 2012-03-11 MED ADMIN — sodium chloride (NS) flush 5-10 mL: INTRAVENOUS | @ 05:00:00 | NDC 87701099893

## 2012-03-11 MED ADMIN — oxyCODONE IR (ROXICODONE) tablet 5 mg: ORAL | @ 20:00:00 | NDC 68084035411

## 2012-03-11 MED ADMIN — sodium chloride (NS) flush 5-10 mL: INTRAVENOUS | @ 06:00:00 | NDC 87701099893

## 2012-03-11 MED ADMIN — sodium chloride (NS) flush 5-10 mL: INTRAVENOUS | @ 19:00:00 | NDC 87701099893

## 2012-03-11 MED ADMIN — octreotide (SANDOSTATIN) 500 mcg in 0.9% sodium chloride 500 mL infusion: INTRAVENOUS | @ 03:00:00 | NDC 55390016210

## 2012-03-11 MED ADMIN — sodium chloride (NS) flush 5-10 mL: INTRAVENOUS | @ 20:00:00 | NDC 87701099893

## 2012-03-11 MED ADMIN — HYDROmorphone (DILAUDID) syringe 0.2 mg: INTRAVENOUS | @ 11:00:00 | NDC 00409128305

## 2012-03-11 MED ADMIN — pantoprazole (PROTONIX) 80mg in NS 100 ml infusion: INTRAVENOUS | @ 11:00:00 | NDC 00338004948

## 2012-03-11 MED ADMIN — sodium chloride 0.9 % bolus infusion 1,000 mL: INTRAVENOUS | NDC 00409798309

## 2012-03-11 MED ADMIN — octreotide (SANDOSTATIN) injection 50 mcg: INTRAVENOUS | @ 03:00:00 | NDC 55390016010

## 2012-03-11 MED ADMIN — cefTRIAXone (ROCEPHIN) injection 1 g: INTRAVENOUS | @ 05:00:00 | NDC 00409733201

## 2012-03-11 MED ADMIN — LORazepam (ATIVAN) injection 1 mg: INTRAVENOUS | @ 07:00:00 | NDC 00641604401

## 2012-03-11 MED ADMIN — lactulose (CHRONULAC) solution 20 g: ORAL | @ 16:00:00 | NDC 66689003801

## 2012-03-11 MED ADMIN — pantoprazole (PROTONIX) 80mg in NS 100 ml infusion: INTRAVENOUS | @ 03:00:00 | NDC 00338004948

## 2012-03-11 MED ADMIN — octreotide (SANDOSTATIN) 500 mcg in 0.9% sodium chloride 500 mL infusion: INTRAVENOUS | @ 05:00:00 | NDC 62756034844

## 2012-03-11 MED ADMIN — rifaximin (XIFAXAN) tablet 550 mg: ORAL | @ 05:00:00 | NDC 65649030303

## 2012-03-11 MED ADMIN — fentaNYL citrate (PF) injection 25 mcg: INTRAVENOUS | @ 06:00:00 | NDC 00641602701

## 2012-03-11 MED ADMIN — octreotide (SANDOSTATIN) 500 mcg in 0.9% sodium chloride 500 mL infusion: INTRAVENOUS | @ 16:00:00 | NDC 55390016210

## 2012-03-11 MED ADMIN — octreotide (SANDOSTATIN) 500 mcg in 0.9% sodium chloride 500 mL infusion: INTRAVENOUS | @ 01:00:00 | NDC 62756034844

## 2012-03-11 MED FILL — LACTULOSE 20 GRAM/30 ML ORAL SOLUTION: 20 gram/30 mL | ORAL | Qty: 30

## 2012-03-11 MED FILL — XIFAXAN 550 MG TABLET: 550 mg | ORAL | Qty: 1

## 2012-03-11 MED FILL — PROTONIX 40 MG INTRAVENOUS SOLUTION: 40 mg | INTRAVENOUS | Qty: 80

## 2012-03-11 MED FILL — SODIUM CHLORIDE 0.9 % IV PIGGY BACK: INTRAVENOUS | Qty: 50

## 2012-03-11 MED FILL — OCTREOTIDE ACETATE 500 MCG/ML INJECTION: 500 mcg/mL | INTRAMUSCULAR | Qty: 1

## 2012-03-11 MED FILL — BD POSIFLUSH NORMAL SALINE 0.9 % INJECTION SYRINGE: INTRAMUSCULAR | Qty: 10

## 2012-03-11 MED FILL — OXYCODONE 5 MG TAB: 5 mg | ORAL | Qty: 1

## 2012-03-11 MED FILL — OCTREOTIDE ACETATE 50 MCG/ML INJECTION: 50 mcg/mL | INTRAMUSCULAR | Qty: 1

## 2012-03-11 MED FILL — LORAZEPAM 2 MG/ML IJ SOLN: 2 mg/mL | INTRAMUSCULAR | Qty: 1

## 2012-03-11 MED FILL — SODIUM CHLORIDE 0.9 % IV: INTRAVENOUS | Qty: 1000

## 2012-03-11 MED FILL — HYDROMORPHONE 0.5 MG/0.5 ML SYRINGE: 0.5 mg/ mL | INTRAMUSCULAR | Qty: 0.5

## 2012-03-11 MED FILL — FENTANYL CITRATE (PF) 50 MCG/ML IJ SOLN: 50 mcg/mL | INTRAMUSCULAR | Qty: 2

## 2012-03-11 MED FILL — THIAMINE 100 MG/ML INJECTION: 100 mg/mL | INTRAMUSCULAR | Qty: 1

## 2012-03-11 MED FILL — TYLENOL 325 MG TABLET: 325 mg | ORAL | Qty: 1

## 2012-03-11 MED FILL — CEFTRIAXONE 1 GRAM SOLUTION FOR INJECTION: 1 gram | INTRAMUSCULAR | Qty: 1

## 2012-03-11 MED FILL — D5-1/2 NS & POTASSIUM CHLORIDE 20 MEQ/L IV: 20 mEq/L | INTRAVENOUS | Qty: 1000

## 2012-03-11 MED FILL — SODIUM CHLORIDE 0.9 % IV: INTRAVENOUS | Qty: 250

## 2012-03-11 NOTE — Consults (Signed)
[facility]  Pulmonary, Critical Care, and Sleep Medicine      Name: [name] MRN: [id number]   DOB: 1950-11-04 Hospital: [facility]   Date: 03/11/2012          Critical Care Initial Patient Consult      IMPRESSION:   ?? GI bleed- most likely upper GI bleed. History of esophageal varices  ?? Anemia 2/2 GI bleed- H&H 7.9/24.4  ?? Thrombocytopenia   ?? Encephalopathy- most likely 2/2 ammonia levels  (136)  ?? Hep C  ?? Cirrhosis of the liver  ?? Hx ETOH abuse  ?? Hx HTN  ?? Hx variceal bleeding      RECOMMENDATIONS:   ?? Respiratory -  Stable  ?? Infectious - Agree w/ ceftriaxone for empiric variceal bleeding antibiotic prophylaxis   ?? Cardiac - Hx of hypertension. Currently hemodynamically stable  ?? Hematologic - Anemia, Agree w/ transfusion of PRBCs. Thrombocytopenia most likley 2/2 liver cirrhosis and is chronic in nature. Recommend to monitor and trend  ?? Metabolic - stable  ?? Renal - stable  ?? Endocrine - No history of endocrine disorders   ?? Neurologic - encephalopathy improved. Most likely hepatic encephalopathy. shoul  ?? Nutrition - NPO  ?? DVT- Sequential hose  ?? GI- GI consulted. Sandostatin gtt, protonix gtt, NPO  ?? Drug and alcohol screen (-)  ?? Pain medications as needed     Subjective/History:     This patient has been seen and evaluated at the request of Dr. Awilda Metro for ICU management for GI bleed.  Patient is a 63 y.o. male with a history of hep C, ETOH abuse, liver cirrhosis, HTN, and esophageal varices was admitted today for GI bleed. 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 Says he has been having frequent melenotic stools, about 15 BMs a day. Has also been complaining of sharp, diffuse abdominal pain that will occasionally radiate into his chest. Says that his last admission for variceal bleeding was in [place], [place] back in July 2013. Last admission in [place] for GI bleed was back in June of 2013.  Currently he is A&Ox3 with stable vitals. If the patient continues to be stable recommend transfer from ICU in the AM.    States that he used to be a smoker, but quit 10 years ago. Also states that he only drinks a beer occasionally.        Past Medical History   Diagnosis Date   ??? Hypertension    ??? Other ill-defined conditions      sickle cell      History reviewed. No pertinent past surgical history.   Prior to Admission medications    Medication Sig Start Date End Date Taking? Authorizing Provider   traMADol (ULTRAM) 50 mg tablet Take 1 Tab by mouth every six (6) hours as needed for Pain. 12/23/11   Marvia Pickles, MD     Current Facility-Administered Medications   Medication Dose Route Frequency   ??? sodium chloride (NS) flush 5-10 mL  5-10 mL IntraVENous Q8H   ??? sodium chloride (NS) flush 5-10 mL  5-10 mL IntraVENous Q8H   ??? [COMPLETED] 0.9% sodium chloride (MBP/ADV) 0.9 % infusion       ??? [COMPLETED] fentaNYL citrate (PF) injection 25 mcg  25 mcg IntraVENous ONCE   ??? [COMPLETED] sodium chloride 0.9 % bolus infusion 1,000 mL  1,000 mL IntraVENous ONCE   ??? [  COMPLETED] pantoprazole (PROTONIX) injection 80 mg  80 mg IntraVENous NOW   ??? octreotide (SANDOSTATIN) 500 mcg in 0.9% sodium chloride 500 mL infusion  50 mcg/hr IntraVENous CONTINUOUS   ??? pantoprazole (PROTONIX) 80mg  in NS 100 ml infusion  8 mg/hr IntraVENous CONTINUOUS   ??? [COMPLETED] cefTRIAXone (ROCEPHIN) injection 1 g  1 g IntraVENous NOW   ??? rifaximin (XIFAXAN) tablet 550 mg  550 mg Oral BID   ??? lactulose (CHRONULAC) solution 20 g  30 mL Oral TID   ??? [COMPLETED] octreotide (SANDOSTATIN) injection 50 mcg  50 mcg IntraVENous ONCE   ??? sodium chloride (NS) flush 5-10 mL  5-10 mL IntraVENous Q8H   ??? thiamine (B-1) 100 mg in 0.9% sodium chloride 50 mL IVPB  100 mg IntraVENous DAILY     No Known Allergies   History   Substance Use Topics   ??? Smoking status: Former Smoker   ??? Smokeless tobacco: Not on file   ??? Alcohol Use: Yes      Family History   Problem Relation  Age of Onset   ??? Heart Disease Father 52     MI   ??? Heart Disease Brother         Review of Systems:  Constitutional: positive for fatigue  Eyes: negative  Ears, nose, mouth, throat, and face: negative  Respiratory: negative  Cardiovascular: positive for chest pressure/discomfort, near-syncope  Gastrointestinal: positive for change in bowel habits, melena and abdominal pain  Genitourinary:negative  Integument/breast: negative  Hematologic/lymphatic: negative  Musculoskeletal:positive for back pain and bone pain  Neurological: positive for dizziness and weakness  Behvioral/Psych: negative  Endocrine: negative  Allergic/Immunologic: negative    Objective:   Vital Signs:    BP 142/78   Pulse 98   Temp(Src) 98.5 ??F (36.9 ??C)   Resp 17   Ht 5\' 10"  (1.778 m)   Wt 105.9 kg (233 lb 7.5 oz)   BMI 33.5 kg/m2   SpO2 99%    O2 Device: Room air   O2 Flow Rate (L/min): 2 l/min   Temp (24hrs), Avg:98.1 ??F (36.7 ??C), Min:97.2 ??F (36.2 ??C), Max:98.5 ??F (36.9 ??C)       Intake/Output:   Last shift:      01/18 1900 - 01/19 0659  In: 310   Out: -   Last 3 shifts:      Intake/Output Summary (Last 24 hours) at 03/11/12 0126  Last data filed at 03/10/12 2145   Gross per 24 hour   Intake    310 ml   Output      0 ml   Net    310 ml     H  Physical Exam:    General:  Alert, cooperative, no distress, appears stated age.   Head:  Normocephalic, without obvious abnormality, atraumatic.   Eyes:  Conjunctivae pallor . PERRL, EOMs intact. Sclera anicteric    Nose: Nares normal. Septum midline. Mucosa normal. No drainage or sinus tenderness.   Throat: Lips, mucosa, and tongue normal. Teeth and gums normal.   Neck: Supple, symmetrical, trachea midline, no adenopathy, thyroid: no enlargment/tenderness/nodules, no carotid bruit and no JVD.   Back:   Symmetric, no curvature. ROM normal.   Lungs:   Clear to auscultation bilaterally.   Chest wall:  No tenderness or deformity.   Heart:  Regular rate and rhythm, S1, S2 normal, no murmur, click, rub or  gallop.   Abdomen:   Soft, Mildly distended, tenderness (-) rebound tenderness,  . Bowel sounds  normal. No masses,  No organomegaly.    Extremities: Extremities normal, atraumatic, no cyanosis or edema.   Pulses: 2+ and symmetric all extremities.    Skin: Pallor w/ decrease in skin turgor    Lymph nodes: Cervical, supraclavicular, and axillary nodes normal.   Neurologic: Grossly nonfocal, alert and oriented x3       Data:     Recent Results (from the past 24 hour(s))   EKG, 12 LEAD, INITIAL    Collection Time    03/10/12  6:50 PM       Result Value Range    Ventricular Rate 111      Atrial Rate 111      P-R Interval 122      QRS Duration 82      Q-T Interval 354      QTC Calculation (Bezet) 481      Calculated P Axis 44      Calculated R Axis 34      Calculated T Axis 80      Diagnosis        Value: Sinus tachycardia      Otherwise normal ECG      When compared with ECG of 26-Jul-2011 14:51,      premature ventricular complexes are no longer present   CBC WITH AUTOMATED DIFF    Collection Time    03/10/12  7:00 PM       Result Value Range    WBC 7.1  4.6 - 13.2 K/uL    RBC 2.91 (*) 4.70 - 5.50 M/uL    HGB 7.9 (*) 13.0 - 16.0 g/dL    HCT 65.7 (*) 84.6 - 48.0 %    MCV 83.8  74.0 - 97.0 FL    MCH 27.1  24.0 - 34.0 PG    MCHC 32.4  31.0 - 37.0 g/dL    RDW 96.2 (*) 95.2 - 14.5 %    PLATELET 60 (*) 135 - 420 K/uL    MPV 11.1  9.2 - 11.8 FL    NEUTROPHILS 67  40 - 73 %    LYMPHOCYTES 14 (*) 21 - 52 %    MONOCYTES 16 (*) 3 - 10 %    EOSINOPHILS 2  0 - 5 %    BASOPHILS 1  0 - 2 %    ABS. NEUTROPHILS 4.8  1.8 - 8.0 K/UL    ABS. LYMPHOCYTES 1.0  0.9 - 3.6 K/UL    ABS. MONOCYTES 1.1  0.05 - 1.2 K/UL    ABS. EOSINOPHILS 0.1  0.0 - 0.4 K/UL    ABS. BASOPHILS 0.1  0.0 - 0.1 K/UL    DF AUTOMATED      PLATELET COMMENTS 1+      RBC COMMENTS        Value: 1+ ANISOCYTOSIS      1+ POLYCHROMASIA   METABOLIC PANEL, COMPREHENSIVE    Collection Time    03/10/12  7:00 PM       Result Value Range    Sodium 141  136 - 145 MMOL/L    Potassium 4.0   3.5 - 5.5 MMOL/L    Chloride 108  100 - 108 MMOL/L    CO2 27  21 - 32 MMOL/L    Anion gap 6  3.0 - 18 mmol/L    Glucose 149 (*) 74 - 99 MG/DL    BUN 21 (*) 7.0 - 18 MG/DL    Creatinine 8.41  0.6 - 1.3 MG/DL  BUN/Creatinine ratio 22 (*) 12 - 20      GFR est AA >60  >60 ml/min/1.23m2    GFR est non-AA >60  >60 ml/min/1.69m2    Calcium 7.8 (*) 8.5 - 10.1 MG/DL    Bilirubin, total 0.7  0.2 - 1.0 MG/DL    ALT 66  16.1 - 09.6 U/L    AST 83 (*) 15 - 37 U/L    Alk. phosphatase 96  50 - 136 U/L    Protein, total 5.9 (*) 6.4 - 8.2 g/dL    Albumin 2.9 (*) 3.4 - 5.0 g/dL    Globulin 3.0  2.0 - 4.0 g/dL    A-G Ratio 1.0  0.8 - 1.7     PTT    Collection Time    03/10/12  7:00 PM       Result Value Range    aPTT 31.4  24.6 - 37.7 SEC   PROTHROMBIN TIME    Collection Time    03/10/12  7:00 PM       Result Value Range    Prothrombin time 15.8 (*) 11.5 - 15.2 sec    INR 1.3 (*) 0.8 - 1.2     TYPE & SCREEN    Collection Time    03/10/12  7:00 PM       Result Value Range    Crossmatch Expiration 03/13/2012      ABO/Rh(D) A POS      Antibody screen NEG      CALLED TO: T TAYLOR, ER, [place] 03/10/12 AT 2011 BY AAJ      Unit number [id number]      Blood component type RC LR AS1      Unit division 00      Status of unit ISSUED      Crossmatch result COMPATIBLE      Unit number [id number]      Blood component type RC LR AS3      Unit division 00      Status of unit ALLOCATED      Crossmatch result COMPATIBLE     AMMONIA    Collection Time    03/10/12  7:25 PM       Result Value Range    Ammonia 136 (*) 11 - 32 UMOL/L   DRUG SCREEN UR - W/ CONFIRM    Collection Time    03/11/12 12:50 AM       Result Value Range    BENZODIAZEPINE NEGATIVE   NEGATIVE    BARBITURATES NEGATIVE   NEGATIVE    THC (TH-CANNABINOL) NEGATIVE   NEGATIVE    OPIATES NEGATIVE   NEGATIVE    PCP(PHENCYCLIDINE) NEGATIVE   NEGATIVE    COCAINE NEGATIVE   NEGATIVE    AMPHETAMINE NEGATIVE   NEGATIVE    METHADONE NEGATIVE   NEGATIVE    HDSCOM (NOTE)               Telemetry:normal  sinus rhythm    Imaging:  I have personally reviewed the patient???s radiographs and have reviewed the reports:    Most likely poor inspiratory effort. No acute process per my read    CT head:  The sulci and ventricles are within normal limits in size and configuration.   There is no evidence of acute intracranial hemorrhage.   No edema, mass effect or definite mass lesion is seen. If there is specific   clinical question of mass, such as staging of known primary malignancy, then  contrast enhanced study, preferably MRI, might be helpful for more sensitive   evaluation as clinically warranted.   No evidence of acute cerebrovascular accident (CVA)/ischemic stroke is   identified. Head CT is often insensitive to early ischemic stroke.   The visualized paranasal sinuses and mastoid air cells appear aerated. The   calvarium appears intact.            Robinette Haines, NP

## 2012-03-11 NOTE — Progress Notes (Signed)
[facility] Hospitalist Group  Progress Note    Patient: [name] Age: 62 y.o. DOB: Dec 23, 1950 MR#: [id number] SSN: [id number]  Date/Time: 03/11/2012 12:01 PM    Subjective:wants pain meds-main complaints is chronic neck pain         Objective:   VS: BP 145/82   Pulse 100   Temp(Src) 97.4 ??F (36.3 ??C)   Resp 18   Ht 5\' 10"  (1.778 m)   Wt 105.9 kg (233 lb 7.5 oz)   BMI 33.5 kg/m2   SpO2 100%   Tmax/24hrs: Temp (24hrs), Avg:98 ??F (36.7 ??C), Min:97.2 ??F (36.2 ??C), Max:98.5 ??F (36.9 ??C)     Input/Output:   Intake/Output Summary (Last 24 hours) at 03/11/12 1201  Last data filed at 03/11/12 0300   Gross per 24 hour   Intake  637.5 ml   Output     30 ml   Net  607.5 ml       General: obese male in NAD   Cardiovascular:  RRR  Pulmonary:  clear  GI:  Mildly distended not tender  Extremities: no CCE   Additional:      Labs:    Recent Results (from the past 24 hour(s))   EKG, 12 LEAD, INITIAL    Collection Time    03/10/12  6:50 PM       Result Value Range    Ventricular Rate 111      Atrial Rate 111      P-R Interval 122      QRS Duration 82      Q-T Interval 354      QTC Calculation (Bezet) 481      Calculated P Axis 44      Calculated R Axis 34      Calculated T Axis 80      Diagnosis        Value: Sinus tachycardia      Nonspecific ST and T wave abnormality      Otherwise normal ECG      When compared with ECG of 26-Jul-2011 14:51,      premature ventricular complexes are no longer present      Confirmed by Geoffery Spruce MD, -- [phone]), editor Rolan Bucco [phone]) on       03/11/2012 6:50:13 AM   CBC WITH AUTOMATED DIFF    Collection Time    03/10/12  7:00 PM       Result Value Range    WBC 7.1  4.6 - 13.2 K/uL    RBC 2.91 (*) 4.70 - 5.50 M/uL    HGB 7.9 (*) 13.0 - 16.0 g/dL    HCT 65.7 (*) 84.6 - 48.0 %    MCV 83.8  74.0 - 97.0 FL    MCH 27.1  24.0 - 34.0 PG    MCHC 32.4  31.0 - 37.0 g/dL    RDW 96.2 (*) 95.2 - 14.5 %    PLATELET 60 (*) 135 - 420 K/uL    MPV 11.1  9.2 - 11.8 FL    NEUTROPHILS  67  40 - 73 %    LYMPHOCYTES 14 (*) 21 - 52 %    MONOCYTES 16 (*) 3 - 10 %    EOSINOPHILS 2  0 - 5 %    BASOPHILS 1  0 - 2 %    ABS. NEUTROPHILS 4.8  1.8 - 8.0 K/UL    ABS. LYMPHOCYTES 1.0  0.9 - 3.6 K/UL  ABS. MONOCYTES 1.1  0.05 - 1.2 K/UL    ABS. EOSINOPHILS 0.1  0.0 - 0.4 K/UL    ABS. BASOPHILS 0.1  0.0 - 0.1 K/UL    DF AUTOMATED      PLATELET COMMENTS 1+      RBC COMMENTS        Value: 1+ ANISOCYTOSIS      1+ POLYCHROMASIA   METABOLIC PANEL, COMPREHENSIVE    Collection Time    03/10/12  7:00 PM       Result Value Range    Sodium 141  136 - 145 MMOL/L    Potassium 4.0  3.5 - 5.5 MMOL/L    Chloride 108  100 - 108 MMOL/L    CO2 27  21 - 32 MMOL/L    Anion gap 6  3.0 - 18 mmol/L    Glucose 149 (*) 74 - 99 MG/DL    BUN 21 (*) 7.0 - 18 MG/DL    Creatinine 1.61  0.6 - 1.3 MG/DL    BUN/Creatinine ratio 22 (*) 12 - 20      GFR est AA >60  >60 ml/min/1.10m2    GFR est non-AA >60  >60 ml/min/1.99m2    Calcium 7.8 (*) 8.5 - 10.1 MG/DL    Bilirubin, total 0.7  0.2 - 1.0 MG/DL    ALT 66  09.6 - 04.5 U/L    AST 83 (*) 15 - 37 U/L    Alk. phosphatase 96  50 - 136 U/L    Protein, total 5.9 (*) 6.4 - 8.2 g/dL    Albumin 2.9 (*) 3.4 - 5.0 g/dL    Globulin 3.0  2.0 - 4.0 g/dL    A-G Ratio 1.0  0.8 - 1.7     PTT    Collection Time    03/10/12  7:00 PM       Result Value Range    aPTT 31.4  24.6 - 37.7 SEC   PROTHROMBIN TIME    Collection Time    03/10/12  7:00 PM       Result Value Range    Prothrombin time 15.8 (*) 11.5 - 15.2 sec    INR 1.3 (*) 0.8 - 1.2     TYPE & SCREEN    Collection Time    03/10/12  7:00 PM       Result Value Range    Crossmatch Expiration 03/13/2012      ABO/Rh(D) A POS      Antibody screen NEG      CALLED TO: T TAYLOR, ER, IN 03/10/12 AT 2011 BY AAJ      Unit number [id number]      Blood component type RC LR AS1      Unit division 00      Status of unit TRANSFUSED      Crossmatch result COMPATIBLE      Unit number [id number]      Blood component type RC LR AS3      Unit division 00      Status of unit  ALLOCATED      Crossmatch result COMPATIBLE     ETHYL ALCOHOL    Collection Time    03/10/12  7:00 PM       Result Value Range    ALCOHOL(ETHYL),SERUM <3  0 - 3 MG/DL   AMMONIA    Collection Time    03/10/12  7:25 PM       Result Value Range    Ammonia  136 (*) 11 - 32 UMOL/L   DRUG SCREEN UR - W/ CONFIRM    Collection Time    03/11/12 12:50 AM       Result Value Range    BENZODIAZEPINE NEGATIVE   NEGATIVE    BARBITURATES NEGATIVE   NEGATIVE    THC (TH-CANNABINOL) NEGATIVE   NEGATIVE    OPIATES NEGATIVE   NEGATIVE    PCP(PHENCYCLIDINE) NEGATIVE   NEGATIVE    COCAINE NEGATIVE   NEGATIVE    AMPHETAMINE NEGATIVE   NEGATIVE    METHADONE NEGATIVE   NEGATIVE    HDSCOM (NOTE)     METABOLIC PANEL, BASIC    Collection Time    03/11/12  2:55 AM       Result Value Range    Sodium 142  136 - 145 MMOL/L    Potassium 3.8  3.5 - 5.5 MMOL/L    Chloride 110 (*) 100 - 108 MMOL/L    CO2 25  21 - 32 MMOL/L    Anion gap 7  3.0 - 18 mmol/L    Glucose 94  74 - 99 MG/DL    BUN 20 (*) 7.0 - 18 MG/DL    Creatinine 1.61  0.6 - 1.3 MG/DL    BUN/Creatinine ratio 26 (*) 12 - 20      GFR est AA >60  >60 ml/min/1.74m2    GFR est non-AA >60  >60 ml/min/1.67m2    Calcium 7.4 (*) 8.5 - 10.1 MG/DL   CBC W/O DIFF    Collection Time    03/11/12  2:55 AM       Result Value Range    WBC 7.2  4.6 - 13.2 K/uL    RBC 2.88 (*) 4.70 - 5.50 M/uL    HGB 7.7 (*) 13.0 - 16.0 g/dL    HCT 09.6 (*) 04.5 - 48.0 %    MCV 84.0  74.0 - 97.0 FL    MCH 26.7  24.0 - 34.0 PG    MCHC 31.8  31.0 - 37.0 g/dL    RDW 40.9 (*) 81.1 - 14.5 %    PLATELET 58 (*) 135 - 420 K/uL    MPV 11.9 (*) 9.2 - 11.8 FL   MAGNESIUM    Collection Time    03/11/12  2:55 AM       Result Value Range    Magnesium 2.0  1.8 - 2.4 MG/DL   PHOSPHORUS    Collection Time    03/11/12  2:55 AM       Result Value Range    Phosphorus 3.0  2.5 - 4.9 MG/DL     Additional Data Reviewed:      Assessment/Plan:     Patient Active Problem List   Diagnosis Code   ??? Other and unspecified noninfectious gastroenteritis and colitis  558.9   ??? 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 Hepatitis C 070.70   ??? Upper GI bleed 578.9   ??? Acute blood loss anemia 285.1   ??? Acute encephalopathy 348.30   ??? Thrombocytopenia, unspecified 287.5         1.GI plans endoscopy  2.continue PPI and Octreonide until then  3.transfuse for HGB of less than 7  7.Roxicodone for chronic neck pain prn    DVT Prophylaxis:  [] Lovenox  [] Hep SQ  [x] SCDs  [] Coumadin   [] On Heparin gtt  Signed By: Hazle Quant, MD     March 11, 2012 12:01 PM

## 2012-03-11 NOTE — Consults (Signed)
[facility]  [address]  [place], [place] [address]    Consult    Patient: [name] MRN: [id number]  SSN: [id number]    Date of Birth: Aug 02, 1950  Age: 62 y.o.  Sex: male        Assessment:     1. Gi bleed , stable now  2. Hep c ? Status   3. History of alcohol abuse  4. Cirrhosis  5. Encephalopathy improved     Plan:     1. Continue octreotide/protonix drip  2. Continue lactulose/xifaxan   3. Risks benefits and alternatives of egd moderate or mac discussed     Subjective:      [name] is a 62 y.o. male who is being seen for several days of melena , confusion, and vague mid epigastric non radiating abd pain .  The pt evidently has relasped on his drinking but it is unclear how much he is actually drinking.   He has a hx of hep C but the status of this is unclear .  No nausea or vomiting noted.  No bleeding since admission by report .  Denies NSAID use.    Hospital Problems Date Reviewed: 26-Mar-2012        Codes Class Noted POA    *Upper GI bleed 578.9  26-Mar-2012 Unknown        Acute blood loss anemia 285.1  March 26, 2012 Unknown        Acute encephalopathy 348.30  26-Mar-2012 Unknown        Thrombocytopenia, unspecified 287.5  03/26/12 Unknown            Past Medical History   Diagnosis Date   ??? Hypertension    ??? Other ill-defined conditions      sickle cell     History reviewed. No pertinent past surgical history.   Family History   Problem Relation Age of Onset   ??? Heart Disease Father 71     MI   ??? Heart Disease Brother      History   Substance Use Topics   ??? Smoking status: Former Smoker   ??? Smokeless tobacco: Not on file   ??? Alcohol Use: Yes      Current Facility-Administered Medications   Medication Dose Route Frequency Provider Last Rate Last Dose   ??? sodium chloride (NS) flush 5-10 mL  5-10 mL IntraVENous Q8H Kabaye Berhanu   10 mL at 03/11/12 0600   ??? sodium chloride (NS) flush 5-10 mL  5-10 mL IntraVENous PRN Kabaye Berhanu       ??? sodium chloride (NS) flush 5-10  mL  5-10 mL IntraVENous Q8H Kabaye Berhanu       ??? sodium chloride (NS) flush 5-10 mL  5-10 mL IntraVENous PRN Kabaye Berhanu       ??? 0.9% sodium chloride infusion 250 mL  250 mL IntraVENous PRN Kabaye Berhanu       ??? ondansetron (ZOFRAN) injection 4 mg  4 mg IntraVENous Q4H PRN Kabaye Berhanu       ??? [COMPLETED] 0.9% sodium chloride (MBP/ADV) 0.9 % infusion        50 mL at 03/11/12 0024   ??? [COMPLETED] fentaNYL citrate (PF) injection 25 mcg  25 mcg IntraVENous ONCE Robinette Haines, NP   25 mcg at 03/11/12 0105   ??? albuterol-ipratropium (DUO-NEB) 2.5 MG-0.5 MG/3 ML  3 mL Nebulization Q4H PRN Robinette Haines, NP       ??? [COMPLETED] HYDROmorphone (DILAUDID) syringe  0.2 mg  0.2 mg IntraVENous ONCE Robinette Haines, NP   0.2 mg at 03/11/12 1610   ??? acetaminophen (TYLENOL) tablet 325 mg  325 mg Oral Q4H PRN Hazle Quant, MD       ??? [COMPLETED] sodium chloride 0.9 % bolus infusion 1,000 mL  1,000 mL IntraVENous ONCE Alver Sorrow, MD   1,000 mL at 03/10/12 1914   ??? [COMPLETED] pantoprazole (PROTONIX) injection 80 mg  80 mg IntraVENous NOW Alver Sorrow, MD   80 mg at 03/10/12 1958   ??? octreotide (SANDOSTATIN) 500 mcg in 0.9% sodium chloride 500 mL infusion  50 mcg/hr IntraVENous CONTINUOUS Alver Sorrow, MD 50 mL/hr at 03/11/12 1037 50 mcg/hr at 03/11/12 1037   ??? pantoprazole (PROTONIX) 80mg  in NS 100 ml infusion  8 mg/hr IntraVENous CONTINUOUS Alver Sorrow, MD 10 mL/hr at 03/11/12 0608 8 mg/hr at 03/11/12 9604   ??? [COMPLETED] cefTRIAXone (ROCEPHIN) injection 1 g  1 g IntraVENous NOW Alver Sorrow, MD   1 g at 03/11/12 0017   ??? rifaximin (XIFAXAN) tablet 550 mg  550 mg Oral BID Alver Sorrow, MD   550 mg at 03/11/12 1042   ??? lactulose (CHRONULAC) solution 20 g  30 mL Oral TID Alver Sorrow, MD   20 g at 03/11/12 1042   ??? [COMPLETED] octreotide (SANDOSTATIN) injection 50 mcg  50 mcg IntraVENous ONCE Alver Sorrow, MD   50 mcg at 03/10/12 2130   ??? sodium chloride (NS) flush  5-10 mL  5-10 mL IntraVENous Q8H Kabaye Berhanu   10 mL at 03/11/12 0025   ??? sodium chloride (NS) flush 5-10 mL  5-10 mL IntraVENous PRN Kabaye Berhanu       ??? LORazepam (ATIVAN) tablet 0.5 mg  0.5 mg Oral Q6H PRN Kabaye Berhanu       ??? LORazepam (ATIVAN) tablet 1 mg  1 mg Oral Q4H PRN Kabaye Berhanu       ??? [EXPIRED] LORazepam (ATIVAN) injection 1 mg  1 mg IntraVENous Q2H PRN Kabaye Berhanu   1 mg at 03/11/12 0347   ??? thiamine (B-1) 100 mg in 0.9% sodium chloride 50 mL IVPB  100 mg IntraVENous DAILY Kabaye Berhanu   100 mg at 03/11/12 1037        No Known Allergies    Review of Systems:  Constitutional: positive for malaise, negative for fevers, chills, sweats and weight loss  Eyes: negative for contacts/glasses, irritation, redness and icterus  Ears, Nose, Mouth, Throat, and Face: negative for hearing loss, epistaxis, snoring, sore mouth and sore throat  Respiratory: negative for cough or sputum  Cardiovascular: negative for chest pain, chest pressure/discomfort, dyspnea, palpitations  Gastrointestinal: positive for melena and abdominal pain, negative for dysphagia, odynophagia, dyspepsia, reflux symptoms and jaundice  Genitourinary:negative for frequency, dysuria and nocturia  Integument/Breast: negative for skin lesion(s), pruritus, dryness and skin color change  Hematologic/Lymphatic: positive for bleeding, negative for easy bruising, lymphadenopathy and petechiae  Musculoskeletal:negative for myalgias, arthralgias, stiff joints and neck pain  Neurological: positive for memory problems, negative for headaches, dizziness, vertigo and seizures  Behavioral/Psychiatric: negative for anxiety and depression  Endocrine: negative for diabetic symptoms including polyuria and polydipsia and temperature intolerance  Allergic/Immunologic: negative for urticaria, hay fever and angioedema    Objective:     Filed Vitals:    03/11/12 0430 03/11/12 0500 03/11/12 0530 03/11/12 0844   BP: 132/85 116/85 126/81 130/81   Pulse: 100  97 105 94  Temp:    97.2 ??F (36.2 ??C)   Resp: 13 14 16 18    Height:       Weight:       SpO2: 95% 94% 99% 98%        Physical Exam:  GENERAL: alert, cooperative, no distress, appears stated age  HEENT: no scleral icterus , pupils equal reactive   Adenopathy: none noted   Cor: RRR no MRG  Lungs: clear to A and P  ABDOMEN: soft, non-tender. Bowel sounds normal. No masses,  no organomegaly  Neuro: A and O x 3 no focal deficit, no clinical encephalopathy  Ext: without deficit CN and motor      Recent Results (from the past 24 hour(s))   EKG, 12 LEAD, INITIAL    Collection Time    03/10/12  6:50 PM       Result Value Range    Ventricular Rate 111      Atrial Rate 111      P-R Interval 122      QRS Duration 82      Q-T Interval 354      QTC Calculation (Bezet) 481      Calculated P Axis 44      Calculated R Axis 34      Calculated T Axis 80      Diagnosis        Value: Sinus tachycardia      Nonspecific ST and T wave abnormality      Otherwise normal ECG      When compared with ECG of 26-Jul-2011 14:51,      premature ventricular complexes are no longer present      Confirmed by Geoffery Spruce MD, -- [phone]), editor Rolan Bucco [phone]) on       03/11/2012 6:50:13 AM   CBC WITH AUTOMATED DIFF    Collection Time    03/10/12  7:00 PM       Result Value Range    WBC 7.1  4.6 - 13.2 K/uL    RBC 2.91 (*) 4.70 - 5.50 M/uL    HGB 7.9 (*) 13.0 - 16.0 g/dL    HCT 54.0 (*) 98.1 - 48.0 %    MCV 83.8  74.0 - 97.0 FL    MCH 27.1  24.0 - 34.0 PG    MCHC 32.4  31.0 - 37.0 g/dL    RDW 19.1 (*) 47.8 - 14.5 %    PLATELET 60 (*) 135 - 420 K/uL    MPV 11.1  9.2 - 11.8 FL    NEUTROPHILS 67  40 - 73 %    LYMPHOCYTES 14 (*) 21 - 52 %    MONOCYTES 16 (*) 3 - 10 %    EOSINOPHILS 2  0 - 5 %    BASOPHILS 1  0 - 2 %    ABS. NEUTROPHILS 4.8  1.8 - 8.0 K/UL    ABS. LYMPHOCYTES 1.0  0.9 - 3.6 K/UL    ABS. MONOCYTES 1.1  0.05 - 1.2 K/UL    ABS. EOSINOPHILS 0.1  0.0 - 0.4 K/UL    ABS. BASOPHILS 0.1  0.0 - 0.1 K/UL    DF AUTOMATED      PLATELET COMMENTS 1+       RBC COMMENTS        Value: 1+ ANISOCYTOSIS      1+ POLYCHROMASIA   METABOLIC PANEL, COMPREHENSIVE    Collection Time    03/10/12  7:00 PM  Result Value Range    Sodium 141  136 - 145 MMOL/L    Potassium 4.0  3.5 - 5.5 MMOL/L    Chloride 108  100 - 108 MMOL/L    CO2 27  21 - 32 MMOL/L    Anion gap 6  3.0 - 18 mmol/L    Glucose 149 (*) 74 - 99 MG/DL    BUN 21 (*) 7.0 - 18 MG/DL    Creatinine 1.61  0.6 - 1.3 MG/DL    BUN/Creatinine ratio 22 (*) 12 - 20      GFR est AA >60  >60 ml/min/1.72m2    GFR est non-AA >60  >60 ml/min/1.72m2    Calcium 7.8 (*) 8.5 - 10.1 MG/DL    Bilirubin, total 0.7  0.2 - 1.0 MG/DL    ALT 66  09.6 - 04.5 U/L    AST 83 (*) 15 - 37 U/L    Alk. phosphatase 96  50 - 136 U/L    Protein, total 5.9 (*) 6.4 - 8.2 g/dL    Albumin 2.9 (*) 3.4 - 5.0 g/dL    Globulin 3.0  2.0 - 4.0 g/dL    A-G Ratio 1.0  0.8 - 1.7     PTT    Collection Time    03/10/12  7:00 PM       Result Value Range    aPTT 31.4  24.6 - 37.7 SEC   PROTHROMBIN TIME    Collection Time    03/10/12  7:00 PM       Result Value Range    Prothrombin time 15.8 (*) 11.5 - 15.2 sec    INR 1.3 (*) 0.8 - 1.2     TYPE & SCREEN    Collection Time    03/10/12  7:00 PM       Result Value Range    Crossmatch Expiration 03/13/2012      ABO/Rh(D) A POS      Antibody screen NEG      CALLED TO: T TAYLOR, ER, IN 03/10/12 AT 2011 BY AAJ      Unit number [id number]      Blood component type RC LR AS1      Unit division 00      Status of unit TRANSFUSED      Crossmatch result COMPATIBLE      Unit number [id number]      Blood component type RC LR AS3      Unit division 00      Status of unit ALLOCATED      Crossmatch result COMPATIBLE     ETHYL ALCOHOL    Collection Time    03/10/12  7:00 PM       Result Value Range    ALCOHOL(ETHYL),SERUM <3  0 - 3 MG/DL   AMMONIA    Collection Time    03/10/12  7:25 PM       Result Value Range    Ammonia 136 (*) 11 - 32 UMOL/L   DRUG SCREEN UR - W/ CONFIRM    Collection Time    03/11/12 12:50 AM       Result Value Range     BENZODIAZEPINE NEGATIVE   NEGATIVE    BARBITURATES NEGATIVE   NEGATIVE    THC (TH-CANNABINOL) NEGATIVE   NEGATIVE    OPIATES NEGATIVE   NEGATIVE    PCP(PHENCYCLIDINE) NEGATIVE   NEGATIVE    COCAINE NEGATIVE   NEGATIVE    AMPHETAMINE NEGATIVE  NEGATIVE    METHADONE NEGATIVE   NEGATIVE    HDSCOM (NOTE)     METABOLIC PANEL, BASIC    Collection Time    03/11/12  2:55 AM       Result Value Range    Sodium 142  136 - 145 MMOL/L    Potassium 3.8  3.5 - 5.5 MMOL/L    Chloride 110 (*) 100 - 108 MMOL/L    CO2 25  21 - 32 MMOL/L    Anion gap 7  3.0 - 18 mmol/L    Glucose 94  74 - 99 MG/DL    BUN 20 (*) 7.0 - 18 MG/DL    Creatinine 6.57  0.6 - 1.3 MG/DL    BUN/Creatinine ratio 26 (*) 12 - 20      GFR est AA >60  >60 ml/min/1.35m2    GFR est non-AA >60  >60 ml/min/1.77m2    Calcium 7.4 (*) 8.5 - 10.1 MG/DL   CBC W/O DIFF    Collection Time    03/11/12  2:55 AM       Result Value Range    WBC 7.2  4.6 - 13.2 K/uL    RBC 2.88 (*) 4.70 - 5.50 M/uL    HGB 7.7 (*) 13.0 - 16.0 g/dL    HCT 84.6 (*) 96.2 - 48.0 %    MCV 84.0  74.0 - 97.0 FL    MCH 26.7  24.0 - 34.0 PG    MCHC 31.8  31.0 - 37.0 g/dL    RDW 95.2 (*) 84.1 - 14.5 %    PLATELET 58 (*) 135 - 420 K/uL    MPV 11.9 (*) 9.2 - 11.8 FL   MAGNESIUM    Collection Time    03/11/12  2:55 AM       Result Value Range    Magnesium 2.0  1.8 - 2.4 MG/DL   PHOSPHORUS    Collection Time    03/11/12  2:55 AM       Result Value Range    Phosphorus 3.0  2.5 - 4.9 MG/DL         Signed By: Jonita Albee, MD     March 11, 2012

## 2012-03-11 NOTE — Consults (Signed)
Pulmonary / Critical Care Physician:    Chart and note reviewed. Data reviewed. Seen on rounds earlier today. I have independently evaluated and examined the patient. I agree with the exam, assessment and plans outlined by Robinette Haines NP  All orders and care plans reviewed  And independently addressed.    IMPRESSION:   GI bleed- most likely upper GI bleed. History of esophageal varices   Anemia 2/2 GI bleed- H&H 7.9/24.4   Thrombocytopenia   Encephalopathy- most likely 2/2 ammonia levels (136)   Hep C   Cirrhosis of the liver   Hx ETOH abuse   Hx HTN   Hx variceal bleeding         PLAN:   ?? Endoscopy per GI  ?? Continue octreotide, protonix  ?? Transfuse for active bleeding   ?? Watch for withdrawal  ?? Monitor for hemodynamic stability  ?? Will transfer out of ICU if no further decompensation         Aminah Zabawa A Kollins Fenter, MD  6:28 AM

## 2012-03-11 NOTE — H&P (Signed)
[facility]                               [address] [place], [place] [address]                               HISTORY AND PHYSICAL    PATIENT:      [name], [name]  MRN:             [id number]      DATE:   03/10/2012  BILLING:         [id number]   DOB:    Dec 06, 1950  ROOM:         [id number]  REFERRING:    NOEL HUNTE, MD  DICTATING:    Edilia Bo, MD        PRIMARY CARE PHYSICIAN: Kennon Rounds, MD    CHIEF COMPLAINT: Black stools.    HISTORY OF PRESENT ILLNESS: The patient is a 62 year old Caucasian male  with history of cirrhosis of the liver, although he reports that he does  not know, he thinks he has viral hepatitis and previous GI bleeds thought  to be variceal, who presented to the ER with the above-stated reports. He  reports he started having black stools last night. He denies hematemesis,  although reported having nausea. He had a few more episodes today, so he  came back to the ER. He reports having shortness of breath and having  generalized body aches. Back in December, he was admitted to [facility] with  similar symptoms he says. Unknown specifics as to how his hospitalization  course went. He says in the past, he has been told both varices and  gastritis to explain his gastrointestinal bleed. In the ER, he is noted to  have evidence of melena and a significant drop in his hemoglobin. We have  been asked to see the patient for further evaluation and management.    REVIEW OF SYSTEMS  The patient reports yes to fevers, chills, cough, lower extremity edema,  bilateral skin rash, joint pain, generalized body aches. The rest of his  10-point review of systems checked and negative.    PAST MEDICAL HISTORY: It seems previous multiple admissions to outside  hospitals at [place], and most recently one in [place] for, he says  similar symptoms, so likely gastrointestinal bleed, possibly related to  varices and/or gastritis per his  description.    He reports he thinks he has some type of viral hepatitis in his charts  documented as hepatitis C infection.    Cirrhosis of the liver, thought to be secondary to alcohol as well as  likely viral hepatitis.    ALLERGIES: NO KNOWN DRUG ALLERGIES.    SOCIAL HISTORY: He reports remote tobacco use history. He reports he drinks  once or twice a month, however, according to his friend at bedside, he  drinks on a weekly basis. She reports he drinks beer. He denies illicit  drug use. He works as a Naval architect.    FAMILY HISTORY: Diabetes.    MEDICATIONS: Listed in his chart, is Ultram only.    PHYSICAL EXAMINATION  VITAL SIGNS: He is afebrile, pulse ranging between 100 and 110, respiratory  rate between 16  and 20, blood pressure systolic 120s, as high as 143, 100%  on 2 L.  GENERAL: He is an obese, somewhat disheveled, ill-appearing male who  appears stated age, resting comfortably, in no acute distress.  PSYCHIATRIC: He is somewhat somnolent, slow of thought, some impairment in  both recent and remote memories. He is oriented.  HEENT: Head is atraumatic, normocephalic. Sclerae anicteric. Oropharynx  without lesions. He has dry mucous membranes.  NECK: Supple.  LYMPHATICS: No neck or axillary lymphadenopathy.  CARDIOVASCULAR: Regular rate and rhythm. No murmurs. No jugular venous  distention.  EXTREMITIES: He has trace pitting edema distally, bilateral lower  extremities.  PULMONARY: Clear to auscultation bilaterally.  GASTROINTESTINAL: Abdomen is soft, nontender, nondistended. No guarding.  SKIN: Presence of tattoos, otherwise unremarkable.  NEUROLOGIC: He does have evidence of asterixis, otherwise nonfocal.  MUSCULOSKELETAL: Is unremarkable.    LABORATORY DATA: Basic metabolic panel unremarkable. Liver testing shows an  albumin of .9. His calcium corrects to a normal range when low albumin  taken into account. His AST is 83. Ammonia level is 136.    CBC shows normal white count, his hemoglobin today is  7.9. It was 16.6  07/2011, platelet count today is 60 and was 50 back in June of last year.  MCV is 83.8, RDW is 19.5. His INR is 1.3.    CT head without contrast shows no evidence of intracranial pathology.    Chest x-ray official read is pending. Per my read, he has no evidence of  acute cardiopulmonary findings.    EKG per my read shows sinus tachycardia, no clear ST elevations, normal  axis. This is per my read.    IMPRESSION  1. Upper gastrointestinal bleed, likely, in this patient presenting with  complaints of melena. Case discussed with gastroenterologist with  recommendations made. The patient does have some tachycardia, otherwise  hemodynamically stable. Guaiac-positive stool found by ER physician.  2. Acute blood loss anemia, likely secondary to problem #1. The patient has  been told of varices and gastritis in the past. Case has been discussed  with the GI specialist by ER physician.  3. Acute encephalopathy, likely secondary to his liver  disease, gastrointestinal bleed as well. At this point in time, his  cognitive impairment is on the mild side. He is somnolent but oriented.  4. Thrombocytopenia, chronic, likely secondary to his alcohol abuse and  cirrhosis of the liver.  5. History of alcohol abuse. The patient seems to under report how much  alcohol he drinks based on discrepancy of information given by him and  friend at bedside.  6. History of hepatitis C infection.    PLAN  1. Admit to the ICU.  2. Continue octreotide and Protonix drip.  3. Transfuse 1 unit of packed red blood cells and follow his hemoglobin and  hematocrit closely.  4. Alcohol withdrawal precautions. Thiamine daily.  5. Rocephin, given suspected variceal bleed.  6. DVT prophylaxis, mechanical.                     Edilia Bo, MD    KB:wmx  D: 03/10/2012  11:35 P  T: 03/11/2012  09:07 A  Job: [id number]  CScriptDoc #: [id number]  cc:   Edilia Bo, MD        Kennon Rounds, MD

## 2012-03-12 LAB — MAGNESIUM: Magnesium: 1.8 MG/DL (ref 1.8–2.4)

## 2012-03-12 LAB — METABOLIC PANEL, COMPREHENSIVE
A-G Ratio: 1.1 (ref 0.8–1.7)
ALT (SGPT): 75 U/L (ref 12.0–78.0)
AST (SGOT): 91 U/L — ABNORMAL HIGH (ref 15–37)
Albumin: 2.9 g/dL — ABNORMAL LOW (ref 3.4–5.0)
Alk. phosphatase: 70 U/L (ref 50–136)
Anion gap: 5 mmol/L (ref 3.0–18)
BUN/Creatinine ratio: 22 — ABNORMAL HIGH (ref 12–20)
BUN: 18 MG/DL (ref 7.0–18)
Bilirubin, total: 1.1 MG/DL — ABNORMAL HIGH (ref 0.2–1.0)
CO2: 26 MMOL/L (ref 21–32)
Calcium: 6.9 MG/DL — ABNORMAL LOW (ref 8.5–10.1)
Chloride: 109 MMOL/L — ABNORMAL HIGH (ref 100–108)
Creatinine: 0.82 MG/DL (ref 0.6–1.3)
GFR est AA: >60 mL/min/{1.73_m2} (ref >60)
GFR est non-AA: >60 mL/min/{1.73_m2} (ref >60)
Globulin: 2.7 g/dL (ref 2.0–4.0)
Glucose: 101 MG/DL — ABNORMAL HIGH (ref 74–99)
Potassium: 3.8 MMOL/L (ref 3.5–5.5)
Protein, total: 5.6 g/dL — ABNORMAL LOW (ref 6.4–8.2)
Sodium: 140 MMOL/L (ref 136–145)

## 2012-03-12 LAB — CBC WITH AUTOMATED DIFF
ABS. BASOPHILS: 0 10*3/uL (ref 0.0–0.1)
ABS. EOSINOPHILS: 0.3 10*3/uL (ref 0.0–0.4)
ABS. LYMPHOCYTES: 0.8 10*3/uL — ABNORMAL LOW (ref 0.9–3.6)
ABS. MONOCYTES: 0.7 10*3/uL (ref 0.05–1.2)
ABS. NEUTROPHILS: 3.3 10*3/uL (ref 1.8–8.0)
BASOPHILS: 1 % (ref 0–2)
EOSINOPHILS: 6 % — ABNORMAL HIGH (ref 0–5)
HCT: 23.1 % — ABNORMAL LOW (ref 36.0–48.0)
HGB: 7.4 g/dL — ABNORMAL LOW (ref 13.0–16.0)
LYMPHOCYTES: 16 % — ABNORMAL LOW (ref 21–52)
MCH: 27.3 PG (ref 24.0–34.0)
MCHC: 32 g/dL (ref 31.0–37.0)
MCV: 85.2 FL (ref 74.0–97.0)
MONOCYTES: 14 % — ABNORMAL HIGH (ref 3–10)
MPV: 11.3 FL (ref 9.2–11.8)
NEUTROPHILS: 63 % (ref 40–73)
PLATELET: 47 10*3/uL — ABNORMAL LOW (ref 135–420)
RBC: 2.71 M/uL — ABNORMAL LOW (ref 4.70–5.50)
RDW: 19.7 % — ABNORMAL HIGH (ref 11.6–14.5)
WBC: 5.1 10*3/uL (ref 4.6–13.2)

## 2012-03-12 LAB — HGB & HCT
HCT: 23.9 % — ABNORMAL LOW (ref 36.0–48.0)
HGB: 7.5 g/dL — ABNORMAL LOW (ref 13.0–16.0)

## 2012-03-12 MED ADMIN — sodium chloride (NS) flush 5-10 mL: INTRAVENOUS | @ 10:00:00 | NDC 87701099893

## 2012-03-12 MED ADMIN — folic acid (FOLVITE) tablet 1 mg: ORAL | @ 14:00:00 | NDC 62584089711

## 2012-03-12 MED ADMIN — sodium chloride (NS) flush 5-10 mL: INTRAVENOUS | @ 03:00:00 | NDC 87701099893

## 2012-03-12 MED ADMIN — oxyCODONE IR (ROXICODONE) tablet 5 mg: ORAL | @ 02:00:00 | NDC 68084035411

## 2012-03-12 MED ADMIN — sodium chloride (NS) flush 5-10 mL: INTRAVENOUS | @ 11:00:00 | NDC 87701099893

## 2012-03-12 MED ADMIN — sodium chloride (NS) flush 5-10 mL: INTRAVENOUS | @ 20:00:00 | NDC 87701099893

## 2012-03-12 MED ADMIN — pantoprazole (PROTONIX) 80mg in NS 100 ml infusion: INTRAVENOUS | @ 03:00:00 | NDC 00338004948

## 2012-03-12 MED ADMIN — octreotide (SANDOSTATIN) 500 mcg in 0.9% sodium chloride 500 mL infusion: INTRAVENOUS | @ 04:00:00 | NDC 55390016210

## 2012-03-12 MED ADMIN — oxyCODONE IR (ROXICODONE) tablet 5 mg: ORAL | @ 20:00:00 | NDC 68084035411

## 2012-03-12 MED ADMIN — octreotide (SANDOSTATIN) 500 mcg in 0.9% sodium chloride 500 mL infusion: INTRAVENOUS | @ 16:00:00 | NDC 55390016210

## 2012-03-12 MED ADMIN — thiamine (B-1) tablet 100 mg: ORAL | @ 14:00:00 | NDC 90011015050

## 2012-03-12 MED ADMIN — rifaximin (XIFAXAN) tablet 550 mg: ORAL | @ 14:00:00 | NDC 65649030303

## 2012-03-12 MED ADMIN — LORazepam (ATIVAN) tablet 0.5 mg: ORAL | @ 05:00:00 | NDC 68084041211

## 2012-03-12 MED ADMIN — lactulose (CHRONULAC) solution 20 g: ORAL | @ 14:00:00 | NDC 66689003801

## 2012-03-12 MED ADMIN — sodium chloride (NS) flush 5-10 mL: INTRAVENOUS | @ 19:00:00 | NDC 87701099893

## 2012-03-12 MED ADMIN — oxyCODONE IR (ROXICODONE) tablet 5 mg: ORAL | @ 14:00:00 | NDC 68084035411

## 2012-03-12 MED ADMIN — pantoprazole (PROTONIX) 80mg in NS 100 ml infusion: INTRAVENOUS | @ 13:00:00 | NDC 00338004948

## 2012-03-12 MED ADMIN — oxyCODONE IR (ROXICODONE) tablet 5 mg: ORAL | @ 08:00:00 | NDC 68084035411

## 2012-03-12 MED FILL — LORAZEPAM 0.5 MG TAB: 0.5 mg | ORAL | Qty: 1

## 2012-03-12 MED FILL — OXYCODONE 5 MG TAB: 5 mg | ORAL | Qty: 1

## 2012-03-12 MED FILL — LACTULOSE 20 GRAM/30 ML ORAL SOLUTION: 20 gram/30 mL | ORAL | Qty: 30

## 2012-03-12 MED FILL — XIFAXAN 550 MG TABLET: 550 mg | ORAL | Qty: 1

## 2012-03-12 MED FILL — D5-1/2 NS & POTASSIUM CHLORIDE 20 MEQ/L IV: 20 mEq/L | INTRAVENOUS | Qty: 1000

## 2012-03-12 MED FILL — OCTREOTIDE ACETATE 500 MCG/ML INJECTION: 500 mcg/mL | INTRAMUSCULAR | Qty: 1

## 2012-03-12 MED FILL — FOLIC ACID 1 MG TAB: 1 mg | ORAL | Qty: 1

## 2012-03-12 MED FILL — SODIUM CHLORIDE 0.9 % IV: INTRAVENOUS | Qty: 250

## 2012-03-12 MED FILL — VITAMIN B-1 100 MG TABLET: 100 mg | ORAL | Qty: 1

## 2012-03-12 NOTE — Progress Notes (Signed)
Hgb stable  No active bleeding  VSS  abd- negative  Plan:  EGD in am

## 2012-03-12 NOTE — Progress Notes (Signed)
Pt is a 62 year old male admitted to the hospital for upper gi bleeding.  Pt is alert and oriented. Pt is alone at bedside.  Pt lives with his girlfriend- Thayer [phone].  Pt does not own any DME.  Pt has not received any HHC in the past.  Pt was independent in care prior this hospital admission.  Pt is hopeful to return home once medically stable. Bo Mcclintock, MSW

## 2012-03-12 NOTE — Other (Signed)
Supervisor notified of need for IV start.

## 2012-03-12 NOTE — Progress Notes (Signed)
[facility]  Progress Note    Patient: [name] Age: 62 y.o. DOB: 06-24-1950 MR#: [id number] SSN: [id number]  Date/Time: 03/12/2012 12:01 PM    Subjective: pt feeling ok but says he is hungry. Admits taking OTC pain meds not tylenol      Objective:   VS: BP 129/83   Pulse 89   Temp(Src) 97 ??F (36.1 ??C)   Resp 18   Ht 5\' 10"  (1.778 m)   Wt 105.9 kg (233 lb 7.5 oz)   BMI 33.5 kg/m2   SpO2 98%   Tmax/24hrs: Temp (24hrs), Avg:97 ??F (36.1 ??C), Min:96.5 ??F (35.8 ??C), Max:97.4 ??F (36.3 ??C)     Input/Output:     Intake/Output Summary (Last 24 hours) at 03/12/12 1652  Last data filed at 03/12/12 0550   Gross per 24 hour   Intake 1947.84 ml   Output      0 ml   Net 1947.84 ml       General: obese male in NAD   Cardiovascular:  RRR  Pulmonary:  clear  GI:  Mildly distended not tender  Extremities: no CCE   Additional:      Labs:    Recent Results (from the past 24 hour(s))   CBC WITH AUTOMATED DIFF    Collection Time    03/12/12  2:28 AM       Result Value Range    WBC 5.1  4.6 - 13.2 K/uL    RBC 2.71 (*) 4.70 - 5.50 M/uL    HGB 7.4 (*) 13.0 - 16.0 g/dL    HCT 78.2 (*) 95.6 - 48.0 %    MCV 85.2  74.0 - 97.0 FL    MCH 27.3  24.0 - 34.0 PG    MCHC 32.0  31.0 - 37.0 g/dL    RDW 21.3 (*) 08.6 - 14.5 %    PLATELET 47 (*) 135 - 420 K/uL    MPV 11.3  9.2 - 11.8 FL    NEUTROPHILS 63  40 - 73 %    LYMPHOCYTES 16 (*) 21 - 52 %    MONOCYTES 14 (*) 3 - 10 %    EOSINOPHILS 6 (*) 0 - 5 %    BASOPHILS 1  0 - 2 %    ABS. NEUTROPHILS 3.3  1.8 - 8.0 K/UL    ABS. LYMPHOCYTES 0.8 (*) 0.9 - 3.6 K/UL    ABS. MONOCYTES 0.7  0.05 - 1.2 K/UL    ABS. EOSINOPHILS 0.3  0.0 - 0.4 K/UL    ABS. BASOPHILS 0.0  0.0 - 0.1 K/UL    DF AUTOMATED     METABOLIC PANEL, COMPREHENSIVE    Collection Time    03/12/12  2:28 AM       Result Value Range    Sodium 140  136 - 145 MMOL/L    Potassium 3.8  3.5 - 5.5 MMOL/L    Chloride 109 (*) 100 - 108 MMOL/L    CO2 26  21 - 32 MMOL/L    Anion gap 5  3.0 - 18 mmol/L    Glucose 101  (*) 74 - 99 MG/DL    BUN 18  7.0 - 18 MG/DL    Creatinine 5.78  0.6 - 1.3 MG/DL    BUN/Creatinine ratio 22 (*) 12 - 20      GFR est AA >60  >60 ml/min/1.23m2    GFR est non-AA >60  >60 ml/min/1.60m2    Calcium  6.9 (*) 8.5 - 10.1 MG/DL    Bilirubin, total 1.1 (*) 0.2 - 1.0 MG/DL    ALT 75  16.1 - 09.6 U/L    AST 91 (*) 15 - 37 U/L    Alk. phosphatase 70  50 - 136 U/L    Protein, total 5.6 (*) 6.4 - 8.2 g/dL    Albumin 2.9 (*) 3.4 - 5.0 g/dL    Globulin 2.7  2.0 - 4.0 g/dL    A-G Ratio 1.1  0.8 - 1.7     MAGNESIUM    Collection Time    03/12/12  2:28 AM       Result Value Range    Magnesium 1.8  1.8 - 2.4 MG/DL   HGB & HCT    Collection Time    03/12/12 11:54 AM       Result Value Range    HGB 7.5 (*) 13.0 - 16.0 g/dL    HCT 04.5 (*) 40.9 - 48.0 %     Additional Data Reviewed:      Assessment/Plan:     Patient Active Problem List   Diagnosis Code   ??? 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 Hepatitis C 070.70   ??? Upper GI bleed 578.9   ??? Acute blood loss anemia 285.1   ??? Thrombocytopenia, unspecified 287.5   ??? Alcohol abuse 305.00   ??? Drug-seeking behavior 305.90         1. Acute blood loss anemia: GI plans endoscopy in am, will transfuse and keep HB> 8.0   2. GI bleed: continue PPI and Octreonide  3. ETOH abuse: No signs of ETOH withdrawal   4. chronic neck pain:  Roxicodone for prn  5. Thrombocytopenia: stable    DVT Prophylaxis:  [] Lovenox  [] Hep SQ  [x] SCDs  [] Coumadin   [] On Heparin gtt  Signed By: Loleta Books, MD     March 12, 2012

## 2012-03-12 NOTE — Other (Signed)
Attempt to start iv x2 without success.

## 2012-03-12 NOTE — Other (Signed)
Bedside and Verbal shift change report given to Reunion  rn (Cabin crew) by Sherrian Divers rn (offgoing nurse).  Report given with SBAR and MAR.

## 2012-03-12 NOTE — Other (Signed)
Bedside and Verbal shift change report given to Wilnette Kales, Charity fundraiser  (Cabin crew) by Hollie Salk, RN (offgoing nurse).  Report given with SBAR, Kardex, Intake/Output, MAR and Recent Results.

## 2012-03-12 NOTE — Other (Signed)
Pt resting in bed. IV site discontinued to lt. forarm.

## 2012-03-13 LAB — TYPE & SCREEN
ABO/Rh(D): A POS
Antibody screen: NEGATIVE
Status of unit: TRANSFUSED
Unit division: 0
Unit division: 0

## 2012-03-13 LAB — METABOLIC PANEL, COMPREHENSIVE
A-G Ratio: 1.3 (ref 0.8–1.7)
ALT (SGPT): 72 U/L (ref 12.0–78.0)
AST (SGOT): 87 U/L — ABNORMAL HIGH (ref 15–37)
Albumin: 2.8 g/dL — ABNORMAL LOW (ref 3.4–5.0)
Alk. phosphatase: 75 U/L (ref 50–136)
Anion gap: 6 mmol/L (ref 3.0–18)
BUN/Creatinine ratio: 16 (ref 12–20)
BUN: 14 MG/DL (ref 7.0–18)
Bilirubin, total: 4 MG/DL — ABNORMAL HIGH (ref 0.2–1.0)
CO2: 27 MMOL/L (ref 21–32)
Calcium: 6.9 MG/DL — ABNORMAL LOW (ref 8.5–10.1)
Chloride: 106 MMOL/L (ref 100–108)
Creatinine: 0.86 MG/DL (ref 0.6–1.3)
GFR est AA: >60 mL/min/{1.73_m2} (ref >60)
GFR est non-AA: >60 mL/min/{1.73_m2} (ref >60)
Globulin: 2.2 g/dL (ref 2.0–4.0)
Glucose: 112 MG/DL — ABNORMAL HIGH (ref 74–99)
Potassium: 3.9 MMOL/L (ref 3.5–5.5)
Protein, total: 5 g/dL — ABNORMAL LOW (ref 6.4–8.2)
Sodium: 139 MMOL/L (ref 136–145)

## 2012-03-13 LAB — TYPE + CROSSMATCH
ABO/Rh(D): A POS
Antibody screen: NEGATIVE
Status of unit: TRANSFUSED
Unit division: 0

## 2012-03-13 LAB — MAGNESIUM: Magnesium: 1.7 MG/DL — ABNORMAL LOW (ref 1.8–2.4)

## 2012-03-13 LAB — HGB & HCT
HCT: 27.6 % — ABNORMAL LOW (ref 36.0–48.0)
HGB: 8.6 g/dL — ABNORMAL LOW (ref 13.0–16.0)

## 2012-03-13 LAB — CBC WITH AUTOMATED DIFF
ABS. BASOPHILS: 0 10*3/uL (ref 0.0–0.06)
ABS. EOSINOPHILS: 0.3 10*3/uL (ref 0.0–0.4)
ABS. LYMPHOCYTES: 0.6 10*3/uL — ABNORMAL LOW (ref 0.9–3.6)
ABS. MONOCYTES: 0.8 10*3/uL (ref 0.05–1.2)
ABS. NEUTROPHILS: 2.5 10*3/uL (ref 1.8–8.0)
BASOPHILS: 0 % (ref 0–2)
EOSINOPHILS: 8 % — ABNORMAL HIGH (ref 0–5)
HCT: 25.4 % — ABNORMAL LOW (ref 36.0–48.0)
HGB: 7.9 g/dL — ABNORMAL LOW (ref 13.0–16.0)
LYMPHOCYTES: 15 % — ABNORMAL LOW (ref 21–52)
MCH: 27.1 PG (ref 24.0–34.0)
MCHC: 31.1 g/dL (ref 31.0–37.0)
MCV: 87 FL (ref 74.0–97.0)
MONOCYTES: 19 % — ABNORMAL HIGH (ref 3–10)
MPV: 11.6 FL (ref 9.2–11.8)
NEUTROPHILS: 58 % (ref 40–73)
PLATELET: 47 10*3/uL — ABNORMAL LOW (ref 135–420)
RBC: 2.92 M/uL — ABNORMAL LOW (ref 4.70–5.50)
RDW: 19.5 % — ABNORMAL HIGH (ref 11.6–14.5)
WBC: 4.2 10*3/uL — ABNORMAL LOW (ref 4.6–13.2)

## 2012-03-13 LAB — HEMOGLOBIN: HGB: 7.6 g/dL — ABNORMAL LOW (ref 13.0–16.0)

## 2012-03-13 LAB — HEMATOCRIT: HCT: 24.4 % — ABNORMAL LOW (ref 36.0–48.0)

## 2012-03-13 LAB — PHOSPHORUS: Phosphorus: 3.3 MG/DL (ref 2.5–4.9)

## 2012-03-13 LAB — TYPE AND SCREEN
ABO/Rh: A POS
Antibody Screen: NEGATIVE
Status: TRANSFUSED
Unit Divison: 0
Unit Divison: 0

## 2012-03-13 MED ADMIN — oxyCODONE IR (ROXICODONE) tablet 5 mg: ORAL | @ 15:00:00 | NDC 68084035411

## 2012-03-13 MED ADMIN — oxyCODONE IR (ROXICODONE) tablet 5 mg: ORAL | @ 19:00:00 | NDC 68084035411

## 2012-03-13 MED ADMIN — lactulose (CHRONULAC) solution 20 g: ORAL | NDC 66689003801

## 2012-03-13 MED ADMIN — sodium chloride (NS) flush 5-10 mL: INTRAVENOUS | @ 19:00:00 | NDC 87701099893

## 2012-03-13 MED ADMIN — sodium chloride (NS) flush 5-10 mL: INTRAVENOUS | @ 02:00:00 | NDC 87701099893

## 2012-03-13 MED ADMIN — sodium chloride (NS) flush 5-10 mL: INTRAVENOUS | @ 11:00:00 | NDC 87701099893

## 2012-03-13 MED ADMIN — pantoprazole (PROTONIX) 80mg in NS 100 ml infusion: INTRAVENOUS | @ 06:00:00 | NDC 00338004948

## 2012-03-13 MED ADMIN — morphine injection 2 mg: INTRAVENOUS | @ 22:00:00 | NDC 00409189001

## 2012-03-13 MED ADMIN — dextrose 5% - 0.45% NaCl with KCl 20 mEq/L infusion: INTRAVENOUS | @ 20:00:00 | NDC 00409790209

## 2012-03-13 MED ADMIN — oxyCODONE IR (ROXICODONE) tablet 5 mg: ORAL | @ 08:00:00 | NDC 68084035411

## 2012-03-13 MED ADMIN — lactulose (CHRONULAC) solution 20 g: ORAL | @ 22:00:00 | NDC 66689003801

## 2012-03-13 MED ADMIN — lactulose (CHRONULAC) solution 20 g: ORAL | @ 02:00:00 | NDC 66689003801

## 2012-03-13 MED ADMIN — rifaximin (XIFAXAN) tablet 550 mg: ORAL | @ 13:00:00 | NDC 65649030303

## 2012-03-13 MED ADMIN — folic acid (FOLVITE) tablet 1 mg: ORAL | @ 13:00:00 | NDC 62584089711

## 2012-03-13 MED ADMIN — oxyCODONE IR (ROXICODONE) tablet 5 mg: ORAL | @ 02:00:00 | NDC 68084035411

## 2012-03-13 MED ADMIN — rifaximin (XIFAXAN) tablet 550 mg: ORAL | NDC 65649030303

## 2012-03-13 MED ADMIN — LORazepam (ATIVAN) tablet 0.5 mg: ORAL | NDC 68084041211

## 2012-03-13 MED ADMIN — magnesium sulfate 1 g/100 ml IVPB (premix or compounded): INTRAVENOUS | @ 22:00:00 | NDC 00409672723

## 2012-03-13 MED ADMIN — rifaximin (XIFAXAN) tablet 550 mg: ORAL | @ 22:00:00 | NDC 65649030303

## 2012-03-13 MED ADMIN — lactulose (CHRONULAC) solution 20 g: ORAL | @ 13:00:00 | NDC 66689003801

## 2012-03-13 MED ADMIN — pantoprazole (PROTONIX) 80mg in NS 100 ml infusion: INTRAVENOUS | @ 20:00:00 | NDC 00338004948

## 2012-03-13 MED ADMIN — octreotide (SANDOSTATIN) 500 mcg in 0.9% sodium chloride 500 mL infusion: INTRAVENOUS | @ 06:00:00 | NDC 55390016210

## 2012-03-13 MED ADMIN — octreotide (SANDOSTATIN) 500 mcg in 0.9% sodium chloride 500 mL infusion: INTRAVENOUS | @ 20:00:00 | NDC 55390016210

## 2012-03-13 MED ADMIN — pantoprazole (PROTONIX) tablet 40 mg: ORAL | @ 22:00:00 | NDC 00008060704

## 2012-03-13 MED ADMIN — thiamine (B-1) tablet 100 mg: ORAL | @ 13:00:00 | NDC 90011015050

## 2012-03-13 MED FILL — OXYCODONE 5 MG TAB: 5 mg | ORAL | Qty: 1

## 2012-03-13 MED FILL — MORPHINE 2 MG/ML INJECTION: 2 mg/mL | INTRAMUSCULAR | Qty: 1

## 2012-03-13 MED FILL — PROTONIX 40 MG INTRAVENOUS SOLUTION: 40 mg | INTRAVENOUS | Qty: 80

## 2012-03-13 MED FILL — LACTULOSE 20 GRAM/30 ML ORAL SOLUTION: 20 gram/30 mL | ORAL | Qty: 30

## 2012-03-13 MED FILL — OCTREOTIDE ACETATE 500 MCG/ML INJECTION: 500 mcg/mL | INTRAMUSCULAR | Qty: 1

## 2012-03-13 MED FILL — LACTATED RINGERS IV: INTRAVENOUS | Qty: 1000

## 2012-03-13 MED FILL — XIFAXAN 550 MG TABLET: 550 mg | ORAL | Qty: 1

## 2012-03-13 MED FILL — LORAZEPAM 0.5 MG TAB: 0.5 mg | ORAL | Qty: 1

## 2012-03-13 MED FILL — MAGNESIUM SULFATE IN D5W 1 GRAM/100 ML IV PIGGY BACK: 1 gram/00 mL | INTRAVENOUS | Qty: 100

## 2012-03-13 MED FILL — PANTOPRAZOLE 40 MG TAB, DELAYED RELEASE: 40 mg | ORAL | Qty: 1

## 2012-03-13 MED FILL — VITAMIN B-1 100 MG TABLET: 100 mg | ORAL | Qty: 1

## 2012-03-13 MED FILL — FOLIC ACID 1 MG TAB: 1 mg | ORAL | Qty: 1

## 2012-03-13 NOTE — Progress Notes (Signed)
Chaplain conducted an initial consultation and Spiritual Assessment for [name], who is a 61 y.o.,male. Patient???s Primary Language is: [place].   According to the patient???s EMR Religious Affiliation is: [place].     The reason the Patient came to the hospital is:   Patient Active Problem List    Diagnosis Date Noted   ??? Alcohol abuse 03/11/2012   ??? Drug-seeking behavior 03/11/2012   ??? Upper GI bleed 03/10/2012   ??? Acute blood loss anemia 03/10/2012   ??? Thrombocytopenia, unspecified 03/10/2012   ??? Hepatitis C 12/23/2011   ??? Alcoholic cirrhosis of liver 07/26/2011        The Chaplain provided the following Interventions:  Initiated a relationship of care and support.   Explored issues of faith, belief, spirituality and religious/ritual needs while hospitalized.  Listened empathically.  Provided chaplaincy education.  Provided information about Spiritual Care Services.  Offered prayer and assurance of continued prayers on patients behalf.   Chart reviewed.    The following outcomes where achieved:  Patient shared limited information about both their medical narrative and spiritual journey/beliefs.  Chaplain confirmed Patient's Religious Affiliation.  Patient processed feeling about current hospitalization.  Patient expressed gratitude for pastoral care visit.    Assessment:  Patient does not have any religious/cultural needs that will affect patient???s preferences in health care.  There are no spiritual or religious issues which require intervention at this time.     Plan:  Chaplains will continue to follow and will provide pastoral care on an as needed/requested basis.  Chaplain recommends bedside caregivers page chaplain on duty if patient shows signs of acute spiritual or emotional distress.    Chaplain Atha Starks  Chaplain Resident  Spiritual Care Department  [phone]

## 2012-03-13 NOTE — Other (Signed)
Bedside, Verbal and Written shift change report given to Trina Berry RN (oncoming nurse) by Wayne Gray and SHANIQUE SOUFFRANT, RN   (offgoing nurse).  Report given with SBAR, Kardex, MAR and Recent Results

## 2012-03-13 NOTE — Procedures (Signed)
[facility]  [address]  [place], [place] [address]     Endoscopic Gastroduodenoscopy Procedure Note    [name]  06-Mar-1950  [id number]    Indication:  Hematemesis - 578.0, Melena/hematochezia - 578.1     Operator: Jacques Navy, MD    Referring Provider:  Marvia Pickles, MD    Anesthesia/Sedation:  MAC anesthesia      Procedure Details     After infomed consent was obtained for the procedure, with all risks and benefits of procedure explained the patient was taken to the endoscopy suite and placed in the left lateral decubitus position.  Following sequential administration of sedation as per above, the endoscope was inserted into the mouth and advanced under direct vision to third portion of the duodenum.  A careful inspection was made as the gastroscope was withdrawn, including a retroflexed view of the proximal stomach; findings and interventions are described below.      Findings:   Esophagus:normal  Stomach: abnormal mucosa-diffuse erythema and friability;  No discrete ulcer, no varices, no active bleeding  Duodenum/jejunum: normal    Therapies:  biopsy of stomach antrum    Specimens: * No specimens in log *           Complications:   None; patient tolerated the procedure well.    EBL:  None.           Impression:    DX: hemmoragic gastritis      Recommendations:  -Acid suppression with a proton pump inhibitor., -Await pathology., -Follow symptoms.    Jacques Navy, MD  03/13/2012  12:57 PM

## 2012-03-13 NOTE — Progress Notes (Signed)
[facility] Hospitalist Group  Progress Note    Patient: [name] Age: 62 y.o. DOB: 1950/09/15 MR#: [id number] SSN: [id number]  Date/Time: 03/13/2012 12:01 PM    Subjective: pt feeling ok, c/o back pain.     Objective:   VS: BP 126/78   Pulse 95   Temp(Src) 97.4 ??F (36.3 ??C)   Resp 21   Ht 5\' 10"  (1.778 m)   Wt 105.9 kg (233 lb 7.5 oz)   BMI 33.5 kg/m2   SpO2 96%   Tmax/24hrs: Temp (24hrs), Avg:97.3 ??F (36.3 ??C), Min:96.6 ??F (35.9 ??C), Max:97.9 ??F (36.6 ??C)     Input/Output:     Intake/Output Summary (Last 24 hours) at 03/13/12 1628  Last data filed at 03/13/12 0320   Gross per 24 hour   Intake    490 ml   Output      0 ml   Net    490 ml       General: obese male in NAD   Cardiovascular:  RRR  Pulmonary:  clear  GI:  Mildly distended not tender  Extremities: no CCE   Additional:      Labs:    Recent Results (from the past 24 hour(s))   HEMOGLOBIN    Collection Time    03/12/12  6:20 PM       Result Value Range    HGB 7.6 (*) 13.0 - 16.0 g/dL   HEMATOCRIT    Collection Time    03/12/12  6:20 PM       Result Value Range    HCT 24.4 (*) 36.0 - 48.0 %   TYPE & CROSSMATCH    Collection Time    03/12/12  6:20 PM       Result Value Range    Crossmatch Expiration 03/15/2012      ABO/Rh(D) A POS      Antibody screen NEG      CALLED TO: Liam Rogers, 4 NORTH, AT 19:56 BY [facility]       Unit number [id number]      Blood component type RC LR AS3      Unit division 00      Status of unit TRANSFUSED      Crossmatch result COMPATIBLE     CBC WITH AUTOMATED DIFF    Collection Time    03/13/12  6:02 AM       Result Value Range    WBC 4.2 (*) 4.6 - 13.2 K/uL    RBC 2.92 (*) 4.70 - 5.50 M/uL    HGB 7.9 (*) 13.0 - 16.0 g/dL    HCT 78.4 (*) 69.6 - 48.0 %    MCV 87.0  74.0 - 97.0 FL    MCH 27.1  24.0 - 34.0 PG    MCHC 31.1  31.0 - 37.0 g/dL    RDW 29.5 (*) 28.4 - 14.5 %    PLATELET 47 (*) 135 - 420 K/uL    MPV 11.6  9.2 - 11.8 FL    NEUTROPHILS 58  40 - 73 %    LYMPHOCYTES 15 (*) 21 - 52 %    MONOCYTES 19 (*) 3 - 10  %    EOSINOPHILS 8 (*) 0 - 5 %    BASOPHILS 0  0 - 2 %    ABS. NEUTROPHILS 2.5  1.8 - 8.0 K/UL    ABS. LYMPHOCYTES 0.6 (*) 0.9 - 3.6 K/UL    ABS. MONOCYTES 0.8  0.05 -  1.2 K/UL    ABS. EOSINOPHILS 0.3  0.0 - 0.4 K/UL    ABS. BASOPHILS 0.0  0.0 - 0.06 K/UL    DF AUTOMATED     METABOLIC PANEL, COMPREHENSIVE    Collection Time    03/13/12  6:02 AM       Result Value Range    Sodium 139  136 - 145 MMOL/L    Potassium 3.9  3.5 - 5.5 MMOL/L    Chloride 106  100 - 108 MMOL/L    CO2 27  21 - 32 MMOL/L    Anion gap 6  3.0 - 18 mmol/L    Glucose 112 (*) 74 - 99 MG/DL    BUN 14  7.0 - 18 MG/DL    Creatinine 1.61  0.6 - 1.3 MG/DL    BUN/Creatinine ratio 16  12 - 20      GFR est AA >60  >60 ml/min/1.11m2    GFR est non-AA >60  >60 ml/min/1.55m2    Calcium 6.9 (*) 8.5 - 10.1 MG/DL    Bilirubin, total 4.0 (*) 0.2 - 1.0 MG/DL    ALT 72  09.6 - 04.5 U/L    AST 87 (*) 15 - 37 U/L    Alk. phosphatase 75  50 - 136 U/L    Protein, total 5.0 (*) 6.4 - 8.2 g/dL    Albumin 2.8 (*) 3.4 - 5.0 g/dL    Globulin 2.2  2.0 - 4.0 g/dL    A-G Ratio 1.3  0.8 - 1.7     MAGNESIUM    Collection Time    03/13/12  6:02 AM       Result Value Range    Magnesium 1.7 (*) 1.8 - 2.4 MG/DL   PHOSPHORUS    Collection Time    03/13/12  6:02 AM       Result Value Range    Phosphorus 3.3  2.5 - 4.9 MG/DL   HGB & HCT    Collection Time    03/13/12 12:29 PM       Result Value Range    HGB 8.6 (*) 13.0 - 16.0 g/dL    HCT 40.9 (*) 81.1 - 48.0 %     Additional Data Reviewed:      Assessment/Plan:     Patient Active Problem List   Diagnosis Code   ??? 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 Hepatitis C 070.70   ??? Upper GI bleed 578.9   ??? Acute blood loss anemia 285.1   ??? Thrombocytopenia, unspecified 287.5   ??? Alcohol abuse 305.00   ??? Drug-seeking behavior 305.90         1. Acute blood loss anemia: s/p endoscopy, will transfuse and keep HB> 8.0   2. Hemorraghic gastritis: GI bleed: continue PPI PO and d/c Octreotide. D/w GI  3. ETOH abuse: No signs of ETOH withdrawal   4.  chronic neck pain:  Roxicodone for prn  5. Thrombocytopenia: stable    DVT Prophylaxis:  [] Lovenox  [] Hep SQ  [x] SCDs  [] Coumadin   [] On Heparin gtt  Signed By: Loleta Books, MD     March 13, 2012

## 2012-03-13 NOTE — Other (Signed)
TRANSFER - OUT REPORT:    Verbal report given to [facility] RN on [name]  being transferred to 460 for routine post - op       Report consisted of patient???s Situation, Background, Assessment and   Recommendations(SBAR).     Information from the following report(s) SBAR was reviewed with the receiving nurse.    Opportunity for questions and clarification was provided.

## 2012-03-13 NOTE — Other (Signed)
Bedside and Verbal shift change report given to Shanique, RN  (oncoming nurse) by Latrina, RN  (offgoing nurse).  Report given with SBAR, Kardex, Intake/Output, MAR and Recent Results.

## 2012-03-13 NOTE — Procedures (Signed)
Procedures  by Jacques Navy, MD at 03/13/12 1256                Author: Jacques Navy, MD  Service: --  Author Type: Physician       Filed: 03/13/12 1259  Date of Service: 03/13/12 1256  Status: Signed          Editor: Jacques Navy, MD (Physician)            Pre-procedure Diagnoses        1. Hemorrhage of gastrointestinal tract, unspecified [578.9]                           Post-procedure Diagnoses        1. Hemorrhagic gastritis [535.51]                           Procedures        1. UPPER GI ENDOSCOPY,BIOPSY [[address] (CPT??)]                                               [facility]   [address]   [place], [place] [address]       Endoscopic Gastroduodenoscopy Procedure Note      [name]   12-05-1950   [id number]      Indication:  Hematemesis - 578.0, Melena/hematochezia - 578.1       Operator: Jacques Navy, MD      Referring Provider:  Marvia Pickles, MD      Anesthesia/Sedation:  MAC anesthesia        Procedure Details       After infomed consent was obtained for the procedure, with all risks and benefits of procedure explained the patient was taken to the endoscopy suite and placed in the left lateral decubitus position.  Following sequential administration of sedation as  per above, the endoscope was inserted into the mouth and advanced under direct vision to third portion of the duodenum.  A careful inspection was made as the gastroscope was withdrawn, including a retroflexed view of the proximal stomach; findings and  interventions are described below.        Findings:    Esophagus:normal   Stomach: abnormal mucosa-diffuse erythema and friability;  No discrete ulcer, no varices, no active bleeding   Duodenum/jejunum: normal      Therapies:  biopsy of stomach antrum      Specimens: * No specimens in log *             Complications:   None; patient tolerated the procedure well.      EBL:  None.             Impression:    DX: hemmoragic gastritis         Recommendations:   -Acid suppression with a proton pump inhibitor., -Await pathology., -Follow symptoms.      Jacques Navy, MD   03/13/2012  12:57 PM

## 2012-03-14 LAB — CBC WITH AUTOMATED DIFF
ABS. BASOPHILS: 0 10*3/uL (ref 0.0–0.1)
ABS. EOSINOPHILS: 0.2 10*3/uL (ref 0.0–0.4)
ABS. LYMPHOCYTES: 0.6 10*3/uL — ABNORMAL LOW (ref 0.9–3.6)
ABS. MONOCYTES: 0.8 10*3/uL (ref 0.05–1.2)
ABS. NEUTROPHILS: 2.5 10*3/uL (ref 1.8–8.0)
BASOPHILS: 1 % (ref 0–2)
EOSINOPHILS: 6 % — ABNORMAL HIGH (ref 0–5)
HCT: 24.9 % — ABNORMAL LOW (ref 36.0–48.0)
HGB: 8 g/dL — ABNORMAL LOW (ref 13.0–16.0)
LYMPHOCYTES: 14 % — ABNORMAL LOW (ref 21–52)
MCH: 27.6 PG (ref 24.0–34.0)
MCHC: 32.1 g/dL (ref 31.0–37.0)
MCV: 85.9 FL (ref 74.0–97.0)
MONOCYTES: 19 % — ABNORMAL HIGH (ref 3–10)
NEUTROPHILS: 60 % (ref 40–73)
PLATELET: 44 10*3/uL — ABNORMAL LOW (ref 135–420)
RBC: 2.9 M/uL — ABNORMAL LOW (ref 4.70–5.50)
RDW: 19.5 % — ABNORMAL HIGH (ref 11.6–14.5)
WBC: 4.1 10*3/uL — ABNORMAL LOW (ref 4.6–13.2)

## 2012-03-14 LAB — METABOLIC PANEL, BASIC
Anion gap: 6 mmol/L (ref 3.0–18)
BUN/Creatinine ratio: 18 (ref 12–20)
BUN: 13 MG/DL (ref 7.0–18)
CO2: 27 MMOL/L (ref 21–32)
Calcium: 6.7 MG/DL — ABNORMAL LOW (ref 8.5–10.1)
Chloride: 105 MMOL/L (ref 100–108)
Creatinine: 0.73 MG/DL (ref 0.6–1.3)
GFR est AA: >60 mL/min/{1.73_m2} (ref >60)
GFR est non-AA: >60 mL/min/{1.73_m2} (ref >60)
Glucose: 79 MG/DL (ref 74–99)
Potassium: 3.6 MMOL/L (ref 3.5–5.5)
Sodium: 138 MMOL/L (ref 136–145)

## 2012-03-14 LAB — PHOSPHORUS: Phosphorus: 3.1 MG/DL (ref 2.5–4.9)

## 2012-03-14 LAB — MAGNESIUM: Magnesium: 1.9 MG/DL (ref 1.8–2.4)

## 2012-03-14 MED ADMIN — LORazepam (ATIVAN) tablet 1 mg: ORAL | @ 02:00:00 | NDC 00904598161

## 2012-03-14 MED ADMIN — lactulose (CHRONULAC) solution 20 g: ORAL | @ 02:00:00 | NDC 66689003801

## 2012-03-14 MED ADMIN — pantoprazole (PROTONIX) tablet 40 mg: ORAL | @ 14:00:00 | NDC 00008060704

## 2012-03-14 MED ADMIN — rifaximin (XIFAXAN) tablet 550 mg: ORAL | @ 14:00:00 | NDC 65649030303

## 2012-03-14 MED ADMIN — sodium chloride (NS) flush 5-10 mL: INTRAVENOUS | @ 04:00:00 | NDC 87701099893

## 2012-03-14 MED ADMIN — thiamine (B-1) tablet 100 mg: ORAL | @ 14:00:00 | NDC 90011015050

## 2012-03-14 MED ADMIN — lactulose (CHRONULAC) solution 20 g: ORAL | @ 14:00:00 | NDC 66689003801

## 2012-03-14 MED ADMIN — sodium chloride (NS) flush 5-10 mL: INTRAVENOUS | @ 19:00:00 | NDC 87701099893

## 2012-03-14 MED ADMIN — sodium chloride (NS) flush 5-10 mL: INTRAVENOUS | @ 12:00:00 | NDC 87701099893

## 2012-03-14 MED ADMIN — folic acid (FOLVITE) tablet 1 mg: ORAL | @ 14:00:00 | NDC 62584089711

## 2012-03-14 MED FILL — PANTOPRAZOLE 40 MG TAB, DELAYED RELEASE: 40 mg | ORAL | Qty: 1

## 2012-03-14 MED FILL — LACTATED RINGERS IV: INTRAVENOUS | Qty: 1000

## 2012-03-14 MED FILL — XIFAXAN 550 MG TABLET: 550 mg | ORAL | Qty: 1

## 2012-03-14 MED FILL — DIPRIVAN 10 MG/ML INTRAVENOUS EMULSION: 10 mg/mL | INTRAVENOUS | Qty: 20

## 2012-03-14 MED FILL — LACTULOSE 20 GRAM/30 ML ORAL SOLUTION: 20 gram/30 mL | ORAL | Qty: 30

## 2012-03-14 MED FILL — LORAZEPAM 1 MG TAB: 1 mg | ORAL | Qty: 1

## 2012-03-14 MED FILL — FOLIC ACID 1 MG TAB: 1 mg | ORAL | Qty: 1

## 2012-03-14 MED FILL — VITAMIN B-1 100 MG TABLET: 100 mg | ORAL | Qty: 1

## 2012-03-14 NOTE — Discharge Summary (Signed)
Discharge Summary    Patient: [name] MRN: [id number]  CSN: [id number]    Date of Birth: 07/30/1950  Age: 62 y.o.  Sex: male    DOA: 03/10/2012 LOS:  LOS: 4 days   Discharge Date:      Admission Diagnoses: upper gi bleeding, anemia, AMS, hepatic encephalopathy, weakness  Upper GI bleed  DX    Discharge Diagnoses:    Problem List as of 03/27/2012 Date Reviewed: March 27, 2012        Codes Class Noted - Resolved    Alcohol abuse 305.00  03/11/2012 - Present        Thrombocytopenia, unspecified 287.5  03/10/2012 - Present        Hepatitis C 070.70  12/23/2011 - Present        Alcoholic cirrhosis of liver (Chronic) 571.2  07/26/2011 - Present        RESOLVED: Drug-seeking behavior 305.90  03/11/2012 - March 27, 2012        *RESOLVED: Upper GI bleed 578.9  03/10/2012 - 2012/03/27        RESOLVED: Acute blood loss anemia 285.1  03/10/2012 - 03-27-2012        RESOLVED: Acute encephalopathy 348.30  03/10/2012 - 03/11/2012        RESOLVED: Other and unspecified noninfectious gastroenteritis and colitis 558.9  07/26/2011 - 03/11/2012              Discharge Condition: Stable    PHYSICAL EXAM  Visit Vitals   Item Reading   ??? BP 109/69   ??? Pulse 81   ??? Temp 97.1 ??F (36.2 ??C)   ??? Resp 18   ??? Ht 5\' 10"  (1.778 m)   ??? Wt 105.9 kg (233 lb 7.5 oz)   ??? BMI 33.5 kg/m2   ??? SpO2 91%       General: Alert, cooperative, no acute distress????  HEENT: NC, Atraumatic.  PERRLA, EOMI. Anicteric sclerae.  Lungs:  CTA Bilaterally. No Wheezing/Rhonchi/Rales.  Heart:  Regular  rhythm,?? No murmur, No Rubs, No Gallops  Abdomen: Soft, obese belly. Non distended, Non tender. +Bowel sounds.  Extremities: No edema/ cyanosis/ clubbing  Psych:???? Good insight.??Not anxious or agitated.  Neurologic:?? CN 2-12 grossly intact, AA oriented X 3.  No acute neurological                                 Deficits.     Hospital Course: Please see dictation. Conformation code # [id number]    Discharge Medications:     Current Discharge Medication List      START taking these medications     Details   folic acid (FOLVITE) 1 mg tablet Take 1 Tab by mouth daily for 15 days.  Qty: 15 Tab, Refills: 0      lactulose (CHRONULAC) 10 gram/15 mL solution Take 30 mL by mouth three (3) times daily.  Qty: 1 Bottle, Refills: 0      oxyCODONE IR (ROXICODONE) 5 mg immediate release tablet Take 1 Tab by mouth every six (6) hours as needed.  Qty: 20 Tab, Refills: 0      pantoprazole (PROTONIX) 40 mg tablet Take 1 Tab by mouth Before breakfast and dinner for 30 days.  Qty: 60 Tab, Refills: 0      rifaximin (XIFAXAN) 550 mg tablet Take 1 Tab by mouth two (2) times a day for 15 days.  Qty: 30 Tab, Refills: 0  thiamine (B-1) 100 mg tablet Take 1 Tab by mouth daily for 30 days.  Qty: 30 Tab, Refills: 0         CONTINUE these medications which have NOT CHANGED    Details   traMADol (ULTRAM) 50 mg tablet Take 1 Tab by mouth every six (6) hours as needed for Pain.  Qty: 100 Tab, Refills: 2    Associated Diagnoses: Abdominal  pain, other specified site; Joint pain           ?? It is important that you take the medication exactly as they are prescribed.   ?? Keep your medication in the bottles provided by the pharmacist and keep a list of the medication names, dosages, and times to be taken in your wallet.   ?? Do not take other medications without consulting your doctor.     DIET:  Cardiac Diet    ACTIVITY: activity as tolerated    ADDITIONAL INFORMATION: If you experience any of the following symptoms but not limited to Fever, chills, nausea, vomiting, diarrhea, change in mentation, falling, bleeding, shortness of breath, chest pain, please call your primary care physician or return to the emergency room if you cannot get hold of your doctor:     FOLLOW UP CARE:  Dr. Marvia Pickles, MD in 7-10 days. Please call and set up an appointment.    Follow-up with 1. Dr. Lawson Fiscal in 2 week    Minutes spent on discharge: >40 minutes spent coordinating this discharge (review instructions/follow-up, prescriptions, preparing report for sign  off)    Loleta Books, MD  03/14/2012 12:25 PM

## 2012-03-14 NOTE — Progress Notes (Signed)
[facility] [place] [place]  [phone]  [email or web address]         Impression/Plan:  1.  GI bleeding- path pending,suspect hemorrhagic gastritis, R/O H pylori  Plan:  Cont PPI   Abx if path + for H pylori   No NSAIDs or ASA        Chief Complaint:  GI bleeding      Subjective:  Hgb stable, no pain,N/V        Eyes: conjunctiva normal, EOM normal   Neck: ROM normal, supple and trachea normal   Cardiovascular: heart normal, intact distal pulses, normal rate and regular rhythm   Pulmonary/Chest Wall: breath sounds normal and effort normal   Abdominal: appearance normal, bowel sounds normal and soft, non-acute, non-tender                BP 109/69   Pulse 81   Temp(Src) 97.1 ??F (36.2 ??C)   Resp 18   Ht 5\' 10"  (1.778 m)   Wt 105.9 kg (233 lb 7.5 oz)   BMI 33.5 kg/m2   SpO2 91%        Intake/Output Summary (Last 24 hours) at 03/14/12 1210  Last data filed at 03/14/12 0956   Gross per 24 hour   Intake    120 ml   Output      0 ml   Net    120 ml       CBC w/Diff    Lab Results   Component Value Date/Time    WBC 4.1* 03/14/2012  2:36 AM    RBC 2.90* 03/14/2012  2:36 AM    HGB 8.0* 03/14/2012  2:36 AM    HCT 24.9* 03/14/2012  2:36 AM    MCV 85.9 03/14/2012  2:36 AM    MCH 27.6 03/14/2012  2:36 AM    MCHC 32.1 03/14/2012  2:36 AM    RDW 19.5* 03/14/2012  2:36 AM    PLT 44* 03/14/2012  2:36 AM    Lab Results   Component Value Date/Time    GRANS 60 03/14/2012  2:36 AM    LYMPH 14* 03/14/2012  2:36 AM    EOS 6* 03/14/2012  2:36 AM    BANDS 1 07/27/2011  4:30 PM    BASOS 1 03/14/2012  2:36 AM    PRO 1* 07/27/2011  4:30 PM      Basic Metabolic Profile   Recent Labs      03/14/12   0236   NA  138   K  3.6   CL  105   CO2  27   BUN  13   CA  6.7*   MG  1.9   PHOS  3.1        Hepatic Function    Lab Results   Component Value Date/Time    ALB 2.8* 03/13/2012  6:02 AM    TP 5.0* 03/13/2012  6:02 AM    AP 75 03/13/2012  6:02 AM    Lab Results   Component Value Date/Time    SGOT 87* 03/13/2012  6:02 AM                     Jacques Navy, MD, MD  [facility] [place] [place]  [phone]  [email or web address]

## 2012-03-14 NOTE — Other (Signed)
Pt is comfortable resting without problems. Rounds completed. Whiteboard updated. Pt has no complaint of pain or discomfort at this time. Call bell within reach. Will continue to monitor pt outcome.

## 2012-03-14 NOTE — Other (Signed)
Bedside and Verbal shift change report given to Shanique RN (oncoming nurse) by Smita Lesh R Lazaria Schaben, RN (offgoing nurse).  Report given with SBAR, Kardex, Intake/Output, MAR and Recent Results.

## 2012-03-14 NOTE — Other (Signed)
Pt is comfortable restng without problems. Will continue to monitor pt outcome thru out the shift

## 2012-03-14 NOTE — Progress Notes (Signed)
Patient ambulatory with steady gate. No complaints of weakness, dizziness, or shortness of breath. No complaints of pain. NO signs of distress.

## 2012-03-15 NOTE — Discharge Summary (Signed)
[facility]                               [address]                          [place], [place] [address]                                 DISCHARGE SUMMARY    PATIENT:   [name], [name]  MRN:           [id number]    ADMIT DATE:    03/10/2012  BILLING:       [id number] [facility] DATE:     03/14/2012  ATTENDING: Edilia Bo, MD  DICTATING  Eugenia Mcalpine, MD      PRIMARY CARE PHYSICIAN: Kennon Rounds, MD    DISPOSITION: Discharged to home.    DISCHARGE CONDITION: Stable.    DISCHARGE DIAGNOSES  1. Acute blood loss anemia, status post blood transfusion in the hospital.  Hemoglobin and hematocrit stable now.  2. Gastrointestinal bleed secondary to hemorrhagic gastritis, status post  upper gastrointestinal endoscopy.  3. Hemorrhagic gastritis secondary to NSAID use and possibly secondary to  alcohol use.  4. ETOH abuse, advised on cessation.  5. Chronic neck pain.  6. Thrombocytopenia secondary to alcohol abuse.  7. History of hepatitis C.  8. History of noncompliance.  9. History of drug-seeking behavior.    DISCHARGE MEDICATIONS  1. Folic acid 1 mg daily.  2. Lactulose 20 grams t.i.d., dose to be adjusted to keep 2-3 bowel  movements per day.  3. Oxycodone IR 5 mg every 6 hours p.r.n.  4. Protonix 40 mg b.i.d.  5. Xifaxan 550 mg b.i.d. for 15 days.  6. Thiamine 100 mg daily.  7. Tramadol 50 mg every 6 hours p.r.n.    CONSULTATIONS IN THE HOSPITAL: Consultation with Dr. Lawson Fiscal,  Gastroenterology.    MAJOR INVESTIGATIONS DURING THE HOSPITAL STAY  1. Patient had upper GI endoscopy, which showed hemorrhagic gastritis, and  biopsies were done, results still pending at the time of dictation.  2. Patient had a CT brain during admission which was negative for any acute  injury.    HOSPITAL COURSE: This is a 62 year old Caucasian male, with known history  of liver cirrhosis, alcohol abuse, hepatitis C, who came into the emergency  room complaining of black stools. Patient  was admitted to the hospital with  impression of acute blood loss anemia secondary to GI bleeding, so patient  was started on octreotide drip, Protonix drip and IV fluids and was  admitted to the hospital. Patient was initially admitted to the ICU because  his drop in hemoglobin was very significant and 2 units of blood  transfusion were given. Gastroenterology consultation was also called. With  the blood transfusion, his hemoglobin and hematocrit stabilized and  Gastroenterology saw the patient and recommended to continue octreotide  drip and Protonix and recommended upper GI endoscopy. Patient, after  stabilization, was transferred out of ICU. In the medical bed, patient  received 1 more unit of blood transfusion. His hemoglobin and hematocrit  remained stable and patient was taken to upper GI endoscopy, which showed  hemorrhagic gastritis, biopsy was done and patient tolerated the procedure  very well, without any complications. Post procedure his Protonix drip and  octreotide drip were  discontinued and was started on diet and patient was  started on p.o. Protonix. Patient tolerated diet very well. His H and H was  monitored overnight, did not change anything. His hemoglobin  remained stable. Patient did have some stools, but no more black stools or  bloody stools. Patient was cleared by Gastroenterology for discharge.  Patient will be discharged home today. Patient is currently hemodynamically  and medically stable for discharge.    DISCHARGE INSTRUCTIONS:  Followup appointments and also physical exam,  please refer to the electronic medical records.    Patient alert, awake, oriented x3. I discussed with the patient about  discharge plan and followup appointments. Also, discussed with him about  smoking cessation, alcohol abuse cessation and also told him that patient  should not be driving when he is taking narcotic medications. I have  advised him not to use any Aleve, ibuprofen or any over-the-counter  NSAIDs,  but instead, he should use narcotics if he has pain, but I advised no  missionary work or driving with the narcotic use. I also advised patient  about followup appointments. He completely understood and agreed with the  plan. I also answered all his questions and concerns at the bedside  appropriately. I also discussed with Gastroenterologist, who also agreed  with the plan of disposition.                     Eugenia Mcalpine, MD    BTM:wmx  D: 03/14/2012 T: 03/15/2012 09:01 A  Job: [id number]  CScriptDoc #: [id number]  cc:   Edilia Bo, MD        Kennon Rounds, MD        Eugenia Mcalpine, MD

## 2012-11-08 ENCOUNTER — Observation Stay
Admit: 2012-11-08 | Discharge: 2012-11-10 | Disposition: A | Discharge status: Home / Self Care | Payer: BLUE CROSS/BLUE SHIELD | Attending: Internal Medicine | Admitting: Internal Medicine

## 2012-11-08 DIAGNOSIS — K7030 Alcoholic cirrhosis of liver without ascites: Secondary | ICD-10-CM

## 2012-11-08 LAB — CBC WITH AUTOMATED DIFF
ABS. BASOPHILS: 0 10*3/uL (ref 0.0–0.06)
ABS. EOSINOPHILS: 0.3 10*3/uL (ref 0.0–0.4)
ABS. LYMPHOCYTES: 0.7 10*3/uL — ABNORMAL LOW (ref 0.9–3.6)
ABS. MONOCYTES: 0.5 10*3/uL (ref 0.05–1.2)
ABS. NEUTROPHILS: 2.2 10*3/uL (ref 1.8–8.0)
BASOPHILS: 1 % (ref 0–2)
EOSINOPHILS: 7 % — ABNORMAL HIGH (ref 0–5)
HCT: 22.6 % — ABNORMAL LOW (ref 36.0–48.0)
HGB: 6.6 g/dL — ABNORMAL LOW (ref 13.0–16.0)
LYMPHOCYTES: 20 % — ABNORMAL LOW (ref 21–52)
MCH: 24 PG (ref 24.0–34.0)
MCHC: 29.2 g/dL — ABNORMAL LOW (ref 31.0–37.0)
MCV: 82.2 FL (ref 74.0–97.0)
MONOCYTES: 14 % — ABNORMAL HIGH (ref 3–10)
MPV: 11.4 FL (ref 9.2–11.8)
NEUTROPHILS: 58 % (ref 40–73)
PLATELET: 56 10*3/uL — ABNORMAL LOW (ref 135–420)
RBC: 2.75 M/uL — ABNORMAL LOW (ref 4.70–5.50)
RDW: 19.5 % — ABNORMAL HIGH (ref 11.6–14.5)
WBC: 3.7 10*3/uL — ABNORMAL LOW (ref 4.6–13.2)

## 2012-11-08 LAB — URINALYSIS W/ RFLX MICROSCOPIC
Bilirubin: NEGATIVE
Blood: NEGATIVE
Glucose: NEGATIVE mg/dL
Ketone: NEGATIVE mg/dL
Leukocyte Esterase: NEGATIVE
Nitrites: NEGATIVE
Protein: NEGATIVE mg/dL
Specific gravity: 1.025 (ref 1.003–1.030)
Urobilinogen: 1 EU/dL (ref 0.2–1.0)
pH (UA): 6 (ref 5.0–8.0)

## 2012-11-08 LAB — METABOLIC PANEL, COMPREHENSIVE
A-G Ratio: 0.8 (ref 0.8–1.7)
ALT (SGPT): 84 U/L — ABNORMAL HIGH (ref 16–61)
AST (SGOT): 86 U/L — ABNORMAL HIGH (ref 15–37)
Albumin: 2.7 g/dL — ABNORMAL LOW (ref 3.4–5.0)
Alk. phosphatase: 112 U/L (ref 45–117)
Anion gap: 3 mmol/L (ref 3.0–18)
BUN/Creatinine ratio: 14 (ref 12–20)
BUN: 11 MG/DL (ref 7.0–18)
Bilirubin, total: 1 MG/DL (ref 0.2–1.0)
CO2: 30 mmol/L (ref 21–32)
Calcium: 7.4 MG/DL — ABNORMAL LOW (ref 8.5–10.1)
Chloride: 103 mmol/L (ref 100–108)
Creatinine: 0.8 MG/DL (ref 0.6–1.3)
GFR est AA: >60 mL/min/{1.73_m2} (ref >60)
GFR est non-AA: >60 mL/min/{1.73_m2} (ref >60)
Globulin: 3.4 g/dL (ref 2.0–4.0)
Glucose: 127 mg/dL — ABNORMAL HIGH (ref 74–99)
Potassium: 3.7 mmol/L (ref 3.5–5.5)
Protein, total: 6.1 g/dL — ABNORMAL LOW (ref 6.4–8.2)
Sodium: 136 mmol/L (ref 136–145)

## 2012-11-08 LAB — PTT: aPTT: 31.2 s (ref 24.6–37.7)

## 2012-11-08 LAB — LACTIC ACID: Lactic acid: 1 MMOL/L (ref 0.4–2.0)

## 2012-11-08 LAB — PROTHROMBIN TIME + INR
INR: 1.3 — ABNORMAL HIGH (ref 0.8–1.2)
Prothrombin time: 16 s — ABNORMAL HIGH (ref 11.5–15.2)

## 2012-11-08 LAB — CARDIAC PANEL,(CK, CKMB & TROPONIN)
CK - MB: 5.6 ng/ml — ABNORMAL HIGH (ref 0.5–3.6)
CK-MB Index: 2.2 % (ref 0.0–4.0)
CK: 252 U/L (ref 39–308)
Troponin-I, QT: <0.02 NG/ML (ref 0.00–0.06)

## 2012-11-08 LAB — LIPASE: Lipase: 303 U/L (ref 73–393)

## 2012-11-08 LAB — POC FECAL OCCULT BLOOD: Occult blood, stool (POC): NEGATIVE

## 2012-11-08 MED ADMIN — hydromorphone (PF) (DILAUDID) injection 1 mg: INTRAVENOUS | @ 19:00:00 | NDC 00409128331

## 2012-11-08 MED ADMIN — ondansetron (ZOFRAN) injection 8 mg: INTRAVENOUS | @ 19:00:00 | NDC 00409475503

## 2012-11-08 MED ADMIN — sodium chloride 0.9 % bolus infusion 1,000 mL: INTRAVENOUS | @ 19:00:00 | NDC 00409798309

## 2012-11-08 MED ADMIN — furosemide (LASIX) tablet 40 mg: ORAL | @ 22:00:00 | NDC 00904579761

## 2012-11-08 MED ADMIN — hydromorphone (PF) (DILAUDID) injection 1 mg: INTRAVENOUS | @ 22:00:00 | NDC 00409128331

## 2012-11-08 MED ADMIN — ioversol (OPTIRAY) 320 mg iodine/mL contrast injection 100 mL: INTRAVENOUS | @ 19:00:00 | NDC 00019132311

## 2012-11-08 NOTE — ED Provider Notes (Signed)
HPI Comments: [name] is a 62 y.o. male with a noted PMHx, who presents to the Emergency Department, with complaints of ABD pain onset 4-5 days ago with ABD distension x 2 days.  Pt states his abdomen is "not normally that big".  Denies vomiting, fevers, diarrhea, blood in stool, urinary problems, bowel problems.  Pt states recent travel and was "on the road" for several days last week.  Pt states LNBM was this morning.  Pt states Hx of liver problems.  Pt states he had a few beers last week.  Pt states NKDA.   Pt states he has been taking a Vitamin B complex.  Pt drank alcohol last Friday, started feeling bad Saturday. Pt drank at least a 12 pack of beer on Friday with 12 shots of liquor. Prior to that it had been 3-4 weeks since he had any kind of alcohol.     No other complaints have been presented at this time. 2:28 PM    The history is provided by the patient. No language interpreter was used.        Past Medical History   Diagnosis Date   ??? Hypertension         History reviewed. No pertinent past surgical history.      Family History   Problem Relation Age of Onset   ??? Heart Disease Father 39     MI   ??? Heart Disease Brother         History     Social History   ??? Marital Status: MARRIED     Spouse Name: N/A     Number of Children: N/A   ??? Years of Education: N/A     Occupational History   ??? Not on file.     Social History Main Topics   ??? Smoking status: Former Smoker   ??? Smokeless tobacco: Not on file   ??? Alcohol Use: Yes   ??? Drug Use: No   ??? Sexually Active: Not on file     Other Topics Concern   ??? Not on file     Social History Narrative   ??? No narrative on file                  ALLERGIES: Review of patient's allergies indicates no known allergies.      Review of Systems   Constitutional: Negative.    HENT: Negative.    Eyes: Negative.    Respiratory: Negative.    Cardiovascular: Negative.    Gastrointestinal: Positive for abdominal pain and abdominal distention.   Genitourinary: Negative.     Musculoskeletal: Negative.    Skin: Negative.    Neurological: Negative.    Psychiatric/Behavioral: Negative.    All other systems reviewed and are negative.        Filed Vitals:    11/08/12 1700 11/08/12 1715 11/08/12 1730 11/08/12 1745   BP: 114/68 137/72 122/75 121/78   Pulse: 93 92 96 96   Temp:       Resp: 13 9 12 12    Height:       Weight:       SpO2: 97% 96% 96% 95%            Physical Exam   Nursing note and vitals reviewed.  Constitutional: He is oriented to person, place, and time. He appears well-developed and well-nourished. No distress.   HENT:   Head: Normocephalic and atraumatic.   Right Ear: External ear normal.  Left Ear: External ear normal.   Nose: Nose normal.   Mouth/Throat: Oropharynx is clear and moist.   Eyes: Conjunctivae and EOM are normal. Pupils are equal, round, and reactive to light. No scleral icterus.   Neck: Normal range of motion. Neck supple. No JVD present. No tracheal deviation present. No thyromegaly present.   Cardiovascular: Normal rate, regular rhythm, normal heart sounds and intact distal pulses.  Exam reveals no gallop and no friction rub.    No murmur heard.  Pulmonary/Chest: Effort normal and breath sounds normal. He exhibits no tenderness.   Abdominal: Bowel sounds are normal. He exhibits distension. There is tenderness. There is no rebound and no guarding.   Tympanitic; TTP   Genitourinary:   Guaiac negative, tan stool.   Musculoskeletal: Normal range of motion. He exhibits no edema and no tenderness.   Lymphadenopathy:     He has no cervical adenopathy.   Neurological: He is alert and oriented to person, place, and time. He has normal reflexes. No cranial nerve deficit. Coordination normal.   Skin: Skin is warm and dry.   Psychiatric: He has a normal mood and affect. His behavior is normal. Judgment and thought content normal.        MDM     Amount and/or Complexity of Data Reviewed:   Clinical lab tests:  Ordered  Tests in the radiology section of CPT??:  Ordered   Tests in the medicine section of the CPT??:  Ordered      Procedures    Pulse Oximetry:   99% RA, wnl    Medications ordered:       Medications   spironolactone (ALDACTONE) tablet 50 mg (not administered)   sodium chloride 0.9 % bolus infusion 1,000 mL (0 mL IntraVENous IV Completed 11/08/12 1755)   ondansetron (ZOFRAN) injection 8 mg (8 mg IntraVENous Given 11/08/12 1443)   hydromorphone (PF) (DILAUDID) injection 1 mg (1 mg IntraVENous Given 11/08/12 1444)   ioversol (OPTIRAY) 320 mg iodine/mL contrast injection 100 mL (100 mL IntraVENous Given 11/08/12 1452)   furosemide (LASIX) tablet 40 mg (40 mg Oral Given 11/08/12 1755)   hydromorphone (PF) (DILAUDID) injection 1 mg (1 mg IntraVENous Given 11/08/12 1806)         Lab findings:     Labs Reviewed   CBC WITH AUTOMATED DIFF - Abnormal; Notable for the following:     WBC 3.7 (*)     RBC 2.75 (*)     HGB 6.6 (*)     HCT 22.6 (*)     MCHC 29.2 (*)     RDW 19.5 (*)     PLATELET 56 (*)     LYMPHOCYTES 20 (*)     MONOCYTES 14 (*)     EOSINOPHILS 7 (*)     ABS. LYMPHOCYTES 0.7 (*)     All other components within normal limits   METABOLIC PANEL, COMPREHENSIVE - Abnormal; Notable for the following:     Glucose 127 (*)     Calcium 7.4 (*)     ALT 84 (*)     AST 86 (*)     Protein, total 6.1 (*)     Albumin 2.7 (*)     All other components within normal limits   PROTHROMBIN TIME + INR - Abnormal; Notable for the following:     Prothrombin time 16.0 (*)     INR 1.3 (*)     All other components within normal limits   CARDIAC PANEL,(CK, CKMB &  TROPONIN) - Abnormal; Notable for the following:     CK - MB 5.6 (*)     All other components within normal limits   LIPASE   PTT   LACTIC ACID, PLASMA   URINALYSIS W/ RFLX MICROSCOPIC   POC FECAL OCCULT BLOOD   TYPE & CROSSMATCH       EKG interpretation: NSR @96 , essentially normal.    X-Ray, CT or other radiology findings or impressions:     CT ABD PELV W CONT    Final Result: IMPRESSION::          Small liver with lobular margin consistent  with cirrhosis; no focal lesions.     Splenomegaly.        Moderately large amount of ascites.        Multilevel spondylitic and disc degenerative changes in the lumbar spine.        There may be a periumbilical ventral hernia containing some fluid .  No other    significant abnormality is seen.       XR CHEST PA LAT    (Results Pending)     CXR: no acute process, my read.    Consult notes or additional Procedure notes:     Consult:  Discussed care with Dr. Eulogio Ditch, GI, Standard discussion; including history of patient???s chief complaint, available diagnostic results, and treatment course. He will consult. Request paracentesis by interventional radiology. Requests pt be given 40 mg Lasix PO and Aldactone 50 mg daily PO.  5:30 PM, 11/08/2012    Consult:  Discussed care with Dr. Theora Gianotti, hospitalist. Hx, physical exam and labs and CT discussed.  Discussed need for the daily lasix and aldactone as per GI, and that aldactone had not been given here.  Also discussed patient typed and crossed for 2 units PRBC.   Discussed GI request for IR guided paracentesis for am.  Accepted patient, tele.    Reevaluation of patient:   I have reevaluated patient. Patient is resting comfortably.discussed labs, CT results, admission, need for transfusion and expected paracentesis tomorrow.    Diagnosis:   1. Anemia    2. Ascites    3. Thrombocytopenia    4. Alcoholic cirrhosis of liver with ascites          Discharge/Disposition:  Patient was admitted in stable condition.     Scribe Attestation:   Annia Friendly 2:30 PM 11/08/2012 scribing for and in the presence of Rona Ravens, MD .    Annia Friendly, Scribe    Provider Attestation:   I personally performed the services described in the documentation, reviewed the documentation, as recorded by the scribe in my presence, and it accurately and completely records my words and actions. November 08, 2012 at 6:26 PM - Rona Ravens, MD      Scribe Attestation:  Veverly Fells. Powers November 08, 2012 at  4:16 PM scribing for and in the presence of Dr.Aneira Cavitt Fanny Bien, MD     Veverly Fells. Powers, Editor, commissioning:   I personally performed the services described in the documentation, reviewed the documentation, as recorded by the scribe in my presence, and it accurately and completely records my words and actions. November 08, 2012 at 6:26 PM - Rona Ravens, MD

## 2012-11-08 NOTE — ED Notes (Signed)
Received "hand-off" bedside report from off-going-shift RN (Angela),and assumed care of patient. Patient greeted / introduced myself as their primary nurse. Encouraged to voice any concerns, and all questions/concerns addressed. Patient updated on all care, including any pending orders or procedures.  Call bell with reach. Awaiting further MD orders at this time. Patient fall risk assessed, with prevention measures in place, to include bed rail up, call bell within reach, frequent toileting in progress, lights on, pathway to bathroom free from obstacles, bed in lowest position, and patient instructed to call for assist OOB at all times, floor dry, slip resistant socks offered if needed. Instructed that staff will make hourly rounds to provide reassessments in pain control, concerns, and any other updates in care. Hand hygiene maintained prior to and after patient/staff interaction.

## 2012-11-08 NOTE — ED Notes (Signed)
Medical Transport at patient bedside, preparing patient for transport. Patient alert, oriented, and stable, with no complaints.

## 2012-11-08 NOTE — ED Notes (Signed)
Attempted to call report; nurse not available per 2 IT trainer.

## 2012-11-08 NOTE — ED Notes (Signed)
Medical Transport at patient bedside, preparing patient for transport to [facility]. Patient alert, oriented, and in stable condition.

## 2012-11-08 NOTE — ED Notes (Signed)
Called nursing supervisor and asked about room assignments and wait. Awaiting room assignment which may be in approximately 30 minutes from now. Pt's nurse made aware.

## 2012-11-08 NOTE — H&P (Signed)
PED HISTORY AND PHYSICAL    Patient: [name] MRN: [id number]  SSN: [id number]    Date of Birth: 10/24/1950  Age: 62 y.o.  Sex: male      PCP: NOEL L HUNTE, MD    Chief Complaint: Abdominal Pain    Assessment:  Pancytopenia - likely secondary to direct alcohol toxicity  Glucose Intolerance  Severe Prot Cal Malnutrition - etiology not entirely clear, likely related to liver dz  Hypomag - likely related to alcohol abuse  Elevated BP - likely HTN  Iron Def Anemia  Presumptive Alcohol Abuse - will not quantify amount of alcohol use - will place on Alcohol w/d protocol  Hx of Cirrhosis of Liver  Hx of Hepatitis  Hx of Esophageal Varicies  Dic # [id number]

## 2012-11-08 NOTE — ED Notes (Signed)
Pt co abd pain and  bloating

## 2012-11-08 NOTE — ED Notes (Signed)
TRANSFER - OUT REPORT:    Verbal report given to Jon Billings RN  on [name]  being transferred to [facility] for routine progression of care       Report consisted of patient???s Situation, Background, Assessment and   Recommendations(SBAR).     Information from the following report(s) ED Summary was reviewed with the receiving nurse. Receiving nurse informed patient to receive Aldactone 50mg  PO and 2 units PRBCs upon arrival, per ED MD orders. (Aldactone not in ED pyxis)    Opportunity for questions and clarification was provided.

## 2012-11-08 NOTE — ED Notes (Addendum)
Medical Transport ([facility]) called, with  ETA given of 60 minutes.

## 2012-11-09 LAB — METABOLIC PANEL, COMPREHENSIVE
A-G Ratio: 0.9 (ref 0.8–1.7)
ALT (SGPT): 89 U/L — ABNORMAL HIGH (ref 16–61)
AST (SGOT): 104 U/L — ABNORMAL HIGH (ref 15–37)
Albumin: 3 g/dL — ABNORMAL LOW (ref 3.4–5.0)
Alk. phosphatase: 90 U/L (ref 45–117)
Anion gap: 6 mmol/L (ref 3.0–18)
BUN/Creatinine ratio: 13 (ref 12–20)
BUN: 10 MG/DL (ref 7.0–18)
Bilirubin, total: 3 MG/DL — ABNORMAL HIGH (ref 0.2–1.0)
CO2: 29 mmol/L (ref 21–32)
Calcium: 7.4 MG/DL — ABNORMAL LOW (ref 8.5–10.1)
Chloride: 102 mmol/L (ref 100–108)
Creatinine: 0.76 MG/DL (ref 0.6–1.3)
GFR est AA: >60 mL/min/{1.73_m2} (ref >60)
GFR est non-AA: >60 mL/min/{1.73_m2} (ref >60)
Globulin: 3.3 g/dL (ref 2.0–4.0)
Glucose: 93 mg/dL (ref 74–99)
Potassium: 3.6 mmol/L (ref 3.5–5.5)
Protein, total: 6.3 g/dL — ABNORMAL LOW (ref 6.4–8.2)
Sodium: 137 mmol/L (ref 136–145)

## 2012-11-09 LAB — CELL COUNT AND DIFF, BODY FLUID
FLD BANDS: 0 %
FLD EOSINS: 1 %
FLD LYMPHS: 20 %
FLD MONOCYTES: 29 %
FLD NEUTROPHILS: 8 %
FLUID RBC CT.: 134 /mm3 — AB
FLUID WBC COUNT: 245 /mm3 — AB
MACROPHAGE: 42 %

## 2012-11-09 LAB — EKG, 12 LEAD, INITIAL
Atrial Rate: 96 {beats}/min
Calculated P Axis: 53 degrees
Calculated R Axis: 26 degrees
Calculated T Axis: 43 degrees
P-R Interval: 92 ms
Q-T Interval: 376 ms
QRS Duration: 80 ms
QTC Calculation (Bezet): 475 ms
Ventricular Rate: 96 {beats}/min

## 2012-11-09 LAB — GLUCOSE, POC
Glucose (POC): 109 mg/dL (ref 70–110)
Glucose (POC): 273 mg/dL — ABNORMAL HIGH (ref 70–110)
Glucose (POC): 145 mg/dL — ABNORMAL HIGH (ref 70–110)

## 2012-11-09 LAB — CBC WITH AUTOMATED DIFF
ABS. BASOPHILS: 0 10*3/uL (ref 0.0–0.1)
ABS. EOSINOPHILS: 0.3 10*3/uL (ref 0.0–0.4)
ABS. LYMPHOCYTES: 0.5 10*3/uL — ABNORMAL LOW (ref 0.9–3.6)
ABS. MONOCYTES: 1 10*3/uL (ref 0.05–1.2)
ABS. NEUTROPHILS: 2.9 10*3/uL (ref 1.8–8.0)
BASOPHILS: 0 % (ref 0–2)
EOSINOPHILS: 6 % — ABNORMAL HIGH (ref 0–5)
HCT: 26.1 % — ABNORMAL LOW (ref 36.0–48.0)
HGB: 8.2 g/dL — ABNORMAL LOW (ref 13.0–16.0)
LYMPHOCYTES: 10 % — ABNORMAL LOW (ref 21–52)
MCH: 25.5 PG (ref 24.0–34.0)
MCHC: 31.4 g/dL (ref 31.0–37.0)
MCV: 81.3 FL (ref 74.0–97.0)
MONOCYTES: 21 % — ABNORMAL HIGH (ref 3–10)
MPV: 10.6 FL (ref 9.2–11.8)
NEUTROPHILS: 63 % (ref 40–73)
PLATELET: 51 10*3/uL — ABNORMAL LOW (ref 135–420)
RBC: 3.21 M/uL — ABNORMAL LOW (ref 4.70–5.50)
RDW: 18.6 % — ABNORMAL HIGH (ref 11.6–14.5)
WBC: 4.6 10*3/uL (ref 4.6–13.2)

## 2012-11-09 LAB — IRON PROFILE
Iron % saturation: 7 %
Iron: 26 ug/dL — ABNORMAL LOW (ref 50–175)
TIBC: 385 ug/dL (ref 250–450)

## 2012-11-09 LAB — CARDIAC PANEL,(CK, CKMB & TROPONIN)
CK - MB: 9.8 ng/ml — ABNORMAL HIGH (ref 0.5–3.6)
CK - MB: 9.8 ng/ml — ABNORMAL HIGH (ref 0.5–3.6)
CK - MB: 10.6 ng/ml — ABNORMAL HIGH (ref 0.5–3.6)
CK-MB Index: 3.9 % (ref 0.0–4.0)
CK-MB Index: 4.1 % — ABNORMAL HIGH (ref 0.0–4.0)
CK-MB Index: 4.1 % — ABNORMAL HIGH (ref 0.0–4.0)
CK: 251 U/L (ref 39–308)
CK: 241 U/L (ref 39–308)
CK: 260 U/L (ref 39–308)
Troponin-I, QT: <0.02 NG/ML (ref 0.0–0.045)
Troponin-I, QT: <0.02 NG/ML (ref 0.0–0.045)
Troponin-I, QT: <0.02 NG/ML (ref 0.0–0.045)

## 2012-11-09 LAB — RETICULOCYTE COUNT: Reticulocyte count: 4.2 % — ABNORMAL HIGH (ref 0.5–2.3)

## 2012-11-09 LAB — VITAMIN B12 & FOLATE
Folate: 17.9 ng/mL (ref 5.38–24.0)
Vitamin B12: 1018 pg/mL — ABNORMAL HIGH (ref 211–911)

## 2012-11-09 LAB — FERRITIN: Ferritin: 14 NG/ML (ref 8–388)

## 2012-11-09 LAB — MAGNESIUM: Magnesium: 1.5 mg/dL — ABNORMAL LOW (ref 1.8–2.4)

## 2012-11-09 LAB — PHOSPHORUS: Phosphorus: 3.6 MG/DL (ref 2.5–4.9)

## 2012-11-09 LAB — CALCIUM, IONIZED: Ionized Calcium: 1.07 MMOL/L — ABNORMAL LOW (ref 1.12–1.32)

## 2012-11-09 LAB — TSH 3RD GENERATION: TSH: 4.22 u[IU]/mL (ref 0.40–5.00)

## 2012-11-09 LAB — LACTIC ACID: Lactic acid: 0.9 MMOL/L (ref 0.4–2.0)

## 2012-11-09 MED ADMIN — magnesium sulfate 4 g/100 ml IVPB: INTRAVENOUS | @ 10:00:00 | NDC 00409672923

## 2012-11-09 MED ADMIN — temazepam (RESTORIL) capsule 7.5 mg: ORAL | @ 05:00:00 | NDC 68084054911

## 2012-11-09 MED ADMIN — furosemide (LASIX) injection 20 mg: INTRAVENOUS | @ 07:00:00 | NDC 00409610202

## 2012-11-09 MED ADMIN — sodium chloride (NS) flush 5-10 mL: INTRAVENOUS | @ 19:00:00 | NDC 87701099893

## 2012-11-09 MED ADMIN — nadolol (CORGARD) tablet 20 mg: ORAL | @ 16:00:00 | NDC 51079081201

## 2012-11-09 MED ADMIN — hydromorphone (PF) (DILAUDID) injection 1 mg: INTRAVENOUS | @ 22:00:00 | NDC 00409128331

## 2012-11-09 MED ADMIN — hydromorphone (PF) (DILAUDID) injection 1 mg: INTRAVENOUS | @ 16:00:00 | NDC 00409128331

## 2012-11-09 MED ADMIN — thiamine (B-1) tablet 100 mg: ORAL | @ 12:00:00 | NDC 90011015050

## 2012-11-09 MED ADMIN — docusate sodium (COLACE) capsule 100 mg: ORAL | @ 12:00:00 | NDC 62584068311

## 2012-11-09 MED ADMIN — lorazepam (ATIVAN) tablet 0.5 mg: ORAL | @ 12:00:00 | NDC 68084073611

## 2012-11-09 MED ADMIN — hydromorphone (PF) (DILAUDID) injection 1 mg: INTRAVENOUS | @ 03:00:00 | NDC 00409128331

## 2012-11-09 MED ADMIN — docusate sodium (COLACE) capsule 100 mg: ORAL | @ 21:00:00 | NDC 62584068311

## 2012-11-09 MED ADMIN — furosemide (LASIX) injection 40 mg: INTRAVENOUS | @ 21:00:00 | NDC 00409610204

## 2012-11-09 MED ADMIN — spironolactone (ALDACTONE) tablet 50 mg: ORAL | @ 12:00:00 | NDC 68084020611

## 2012-11-09 MED ADMIN — hydromorphone (PF) (DILAUDID) injection 1 mg: INTRAVENOUS | @ 10:00:00 | NDC 00409128331

## 2012-11-09 MED ADMIN — spironolactone (ALDACTONE) tablet 50 mg: ORAL | @ 03:00:00 | NDC 68084020611

## 2012-11-09 MED ADMIN — therapeutic multivitamin (THERAGRAN) tablet 1 tablet: ORAL | @ 12:00:00

## 2012-11-09 MED ADMIN — furosemide (LASIX) injection 40 mg: INTRAVENOUS | @ 12:00:00 | NDC 00409610204

## 2012-11-09 MED ADMIN — lorazepam (ATIVAN) tablet 0.5 mg: ORAL | @ 21:00:00 | NDC 68084073611

## 2012-11-09 MED ADMIN — sodium chloride (NS) flush 5-10 mL: INTRAVENOUS | @ 12:00:00 | NDC 87701099893

## 2012-11-09 MED ADMIN — folic acid (FOLVITE) tablet 1 mg: ORAL | @ 12:00:00 | NDC 62584089711

## 2012-11-09 NOTE — Procedures (Signed)
RADIOLOGY POST PROCEDURE NOTE     November 09, 2012       3:10 PM     Preoperative Diagnosis:   Ascites.    Postoperative Diagnosis:  Same.    Operator:  Dr. Larry Sierras    Assistant:  None.    Type of Anesthesia: 1% plain lidocaine    Procedure/Description:  US guided paracentesis    Findings:   See rpeort.    Estimated blood Loss:  Minimal    Specimen Removed:   yes    Blood transfusions:  None.    Implants:  None.    Complications: None    Condition: Stable    Discharge Plan:  continue present therapy    Delia Heady, MD

## 2012-11-09 NOTE — Progress Notes (Signed)
[facility] Hospitalist Group  Progress Note    Patient: [name] Age: 62 y.o. DOB: [birth year] MR#: [id number] SSN: [id number]  Date/Time: 11/09/2012 10:24 AM    Subjective:     Comfortable, no gi blood loss, sp blood transfusion, for US guided paracentesis today    Objective:   VS: BP 121/70   Pulse 98   Temp(Src) 98.1 ??F (36.7 ??C)   Resp 20   Ht 5\' 10"  (1.778 m)   Wt 76.658 kg (169 lb)   BMI 24.25 kg/m2   SpO2 92%   Tmax/24hrs: Temp (24hrs), Avg:97.7 ??F (36.5 ??C), Min:97.1 ??F (36.2 ??C), Max:98.5 ??F (36.9 ??C)     Input/Output:   Intake/Output Summary (Last 24 hours) at 11/09/12 1024  Last data filed at 11/09/12 0935   Gross per 24 hour   Intake   1200 ml   Output   3075 ml   Net  -1875 ml       General:  nad  Cardiovascular:  s1s2  Pulmonary:  clear  GI:  Distended, bs +  Extremities:  2 + le edema  Additional:  No skin changes  Neuro:  Non focal    Labs:    Recent Results (from the past 24 hour(s))   CBC WITH AUTOMATED DIFF    Collection Time     11/08/12  2:20 PM       Result Value Range    WBC 3.7 (*) 4.6 - 13.2 K/uL    RBC 2.75 (*) 4.70 - 5.50 M/uL    HGB 6.6 (*) 13.0 - 16.0 g/dL    HCT 78.2 (*) 95.6 - 48.0 %    MCV 82.2  74.0 - 97.0 FL    MCH 24.0  24.0 - 34.0 PG    MCHC 29.2 (*) 31.0 - 37.0 g/dL    RDW 21.3 (*) 08.6 - 14.5 %    PLATELET 56 (*) 135 - 420 K/uL    MPV 11.4  9.2 - 11.8 FL    NEUTROPHILS 58  40 - 73 %    LYMPHOCYTES 20 (*) 21 - 52 %    MONOCYTES 14 (*) 3 - 10 %    EOSINOPHILS 7 (*) 0 - 5 %    BASOPHILS 1  0 - 2 %    ABS. NEUTROPHILS 2.2  1.8 - 8.0 K/UL    ABS. LYMPHOCYTES 0.7 (*) 0.9 - 3.6 K/UL    ABS. MONOCYTES 0.5  0.05 - 1.2 K/UL    ABS. EOSINOPHILS 0.3  0.0 - 0.4 K/UL    ABS. BASOPHILS 0.0  0.0 - 0.06 K/UL    DF AUTOMATED      RBC COMMENTS ANISOCYTOSIS      RBC COMMENTS 1+      RBC COMMENTS POLYCHROMASIA      RBC COMMENTS 1+      RBC COMMENTS HYPOCHROMIA      RBC COMMENTS 1+     METABOLIC PANEL, COMPREHENSIVE    Collection Time     11/08/12  2:20 PM       Result  Value Range    Sodium 136  136 - 145 mmol/L    Potassium 3.7  3.5 - 5.5 mmol/L    Chloride 103  100 - 108 mmol/L    CO2 30  21 - 32 mmol/L    Anion gap 3  3.0 - 18 mmol/L    Glucose 127 (*) 74 - 99 mg/dL    BUN 11  7.0 - 18 MG/DL    Creatinine 9.60  0.6 - 1.3 MG/DL    BUN/Creatinine ratio 14  12 - 20      GFR est AA >60  >60 ml/min/1.25m2    GFR est non-AA >60  >60 ml/min/1.15m2    Calcium 7.4 (*) 8.5 - 10.1 MG/DL    Bilirubin, total 1.0  0.2 - 1.0 MG/DL    ALT 84 (*) 16 - 61 U/L    AST 86 (*) 15 - 37 U/L    Alk. phosphatase 112  45 - 117 U/L    Protein, total 6.1 (*) 6.4 - 8.2 g/dL    Albumin 2.7 (*) 3.4 - 5.0 g/dL    Globulin 3.4  2.0 - 4.0 g/dL    A-G Ratio 0.8  0.8 - 1.7     LIPASE    Collection Time     11/08/12  2:20 PM       Result Value Range    Lipase 303  73 - 393 U/L   PROTHROMBIN TIME + INR    Collection Time     11/08/12  2:20 PM       Result Value Range    Prothrombin time 16.0 (*) 11.5 - 15.2 sec    INR 1.3 (*) 0.8 - 1.2     PTT    Collection Time     11/08/12  2:20 PM       Result Value Range    aPTT 31.2  24.6 - 37.7 SEC   LACTIC ACID, PLASMA    Collection Time     11/08/12  2:20 PM       Result Value Range    Lactic acid 1.0  0.4 - 2.0 MMOL/L   CARDIAC PANEL,(CK, CKMB & TROPONIN)    Collection Time     11/08/12  3:04 PM       Result Value Range    CK 252  39 - 308 U/L    CK - MB 5.6 (*) 0.5 - 3.6 ng/ml    CK-MB Index 2.2  0.0 - 4.0 %    Troponin-I, Qt. <0.02  0.00 - 0.06 NG/ML   URINALYSIS W/ RFLX MICROSCOPIC    Collection Time     11/08/12  3:40 PM       Result Value Range    Color YELLOW      Appearance CLEAR      Specific gravity 1.025  1.003 - 1.030      pH (UA) 6.0  5.0 - 8.0      Protein NEGATIVE   NEG mg/dL    Glucose NEGATIVE   NEG mg/dL    Ketone NEGATIVE   NEG mg/dL    Bilirubin NEGATIVE   NEG      Blood NEGATIVE   NEG      Urobilinogen 1.0  0.2 - 1.0 EU/dL    Nitrites NEGATIVE   NEG      Leukocyte Esterase NEGATIVE   NEG     TYPE & CROSSMATCH    Collection Time     11/08/12  4:05 PM        Result Value Range    Crossmatch Expiration 11/11/2012      ABO/Rh(D) A POSITIVE      Antibody screen NEG      Unit number [id number]      Blood component type RC LR AS1      Unit division 00      Status of  unit TRANSFUSED      Crossmatch result COMPATIBLE      Unit number [id number]      Blood component type RC LR AS1      Unit division 00      Status of unit ISSUED      Crossmatch result COMPATIBLE     POC FECAL OCCULT BLOOD    Collection Time     11/08/12  4:31 PM       Result Value Range    Occult blood, stool (POC) NEGATIVE   NEG     EKG, 12 LEAD, INITIAL    Collection Time     11/08/12  6:38 PM       Result Value Range    Ventricular Rate 96      Atrial Rate 96      P-R Interval 92      QRS Duration 80      Q-T Interval 376      QTC Calculation (Bezet) 475      Calculated P Axis 53      Calculated R Axis 26      Calculated T Axis 43      Diagnosis        Value: Sinus rhythm with short PR with premature atrial complexes      Otherwise normal ECG      When compared with ECG of 10-Mar-2012 18:50,      premature atrial complexes are now present      Confirmed by Juanetta Gosling MD ([id number]) on 11/09/2012 7:32:14 AM   VITAMIN B12 & FOLATE    Collection Time     11/08/12 11:25 PM       Result Value Range    Vitamin B12 1018 (*) 211 - 911 pg/mL    Folate 17.9  5.38 - 24.0 ng/mL   IRON PROFILE    Collection Time     11/08/12 11:25 PM       Result Value Range    Iron 26 (*) 50 - 175 ug/dL    TIBC 119  147 - 829 ug/dL    Iron % saturation 7     FERRITIN    Collection Time     11/08/12 11:25 PM       Result Value Range    Ferritin 14  8 - 388 NG/ML   TSH, 3RD GENERATION    Collection Time     11/08/12 11:25 PM       Result Value Range    TSH 4.22  0.40 - 5.00 uIU/mL   MAGNESIUM    Collection Time     11/08/12 11:25 PM       Result Value Range    Magnesium 1.5 (*) 1.8 - 2.4 mg/dL   PHOSPHORUS    Collection Time     11/08/12 11:25 PM       Result Value Range    Phosphorus 3.6  2.5 - 4.9 MG/DL   CARDIAC PANEL,(CK, CKMB &  TROPONIN)    Collection Time     11/08/12 11:25 PM       Result Value Range    CK 251  39 - 308 U/L    CK - MB 9.8 (*) 0.5 - 3.6 ng/ml    CK-MB Index 3.9  0.0 - 4.0 %    Troponin-I, Qt. <0.02  0.0 - 0.045 NG/ML   RETICULOCYTE COUNT    Collection Time     11/08/12 11:25 PM  Result Value Range    Reticulocyte count 4.2 (*) 0.5 - 2.3 %   CALCIUM, IONIZED    Collection Time     11/08/12 11:25 PM       Result Value Range    Ionized Calcium 1.07 (*) 1.12 - 1.32 MMOL/L   LACTIC ACID, PLASMA    Collection Time     11/08/12 11:25 PM       Result Value Range    Lactic acid 0.9  0.4 - 2.0 MMOL/L   CARDIAC PANEL,(CK, CKMB & TROPONIN)    Collection Time     11/09/12  6:50 AM       Result Value Range    CK 241  39 - 308 U/L    CK - MB 9.8 (*) 0.5 - 3.6 ng/ml    CK-MB Index 4.1 (*) 0.0 - 4.0 %    Troponin-I, Qt. <0.02  0.0 - 0.045 NG/ML   METABOLIC PANEL, COMPREHENSIVE    Collection Time     11/09/12  6:50 AM       Result Value Range    Sodium 137  136 - 145 mmol/L    Potassium 3.6  3.5 - 5.5 mmol/L    Chloride 102  100 - 108 mmol/L    CO2 29  21 - 32 mmol/L    Anion gap 6  3.0 - 18 mmol/L    Glucose 93  74 - 99 mg/dL    BUN 10  7.0 - 18 MG/DL    Creatinine 1.61  0.6 - 1.3 MG/DL    BUN/Creatinine ratio 13  12 - 20      GFR est AA >60  >60 ml/min/1.48m2    GFR est non-AA >60  >60 ml/min/1.31m2    Calcium 7.4 (*) 8.5 - 10.1 MG/DL    Bilirubin, total 3.0 (*) 0.2 - 1.0 MG/DL    ALT 89 (*) 16 - 61 U/L    AST 104 (*) 15 - 37 U/L    Alk. phosphatase 90  45 - 117 U/L    Protein, total 6.3 (*) 6.4 - 8.2 g/dL    Albumin 3.0 (*) 3.4 - 5.0 g/dL    Globulin 3.3  2.0 - 4.0 g/dL    A-G Ratio 0.9  0.8 - 1.7     GLUCOSE, POC    Collection Time     11/09/12  7:46 AM       Result Value Range    Glucose (POC) 109  70 - 110 mg/dL     Additional Data Reviewed:      Current Facility-Administered Medications   Medication Dose Route Frequency   ??? temazepam (RESTORIL) capsule 7.5 mg  7.5 mg Oral QHS PRN   ??? ondansetron (ZOFRAN) injection 4 mg  4 mg  IntraVENous Q8H PRN   ??? docusate sodium (COLACE) capsule 100 mg  100 mg Oral BID   ??? [COMPLETED] magnesium sulfate 4 g/100 ml IVPB  4 g IntraVENous ONCE   ??? furosemide (LASIX) injection 20 mg  20 mg IntraVENous PRN   ??? furosemide (LASIX) injection 40 mg  40 mg IntraVENous BID   ??? sodium chloride (NS) flush 5-10 mL  5-10 mL IntraVENous Q8H   ??? sodium chloride (NS) flush 5-10 mL  5-10 mL IntraVENous PRN   ??? lorazepam (ATIVAN) tablet 0.5 mg  0.5 mg Oral Q6H   ??? lorazepam (ATIVAN) tablet 1 mg  1 mg Oral Q6H   ??? lorazepam (ATIVAN) injection 1 mg  1 mg IntraVENous Q15MIN PRN   ??? folic acid (FOLVITE) tablet 1 mg  1 mg Oral DAILY   ??? thiamine (B-1) tablet 100 mg  100 mg Oral DAILY   ??? therapeutic multivitamin (THERAGRAN) tablet 1 tablet  1 tablet Oral DAILY   ??? [START ON 11/10/2012] spironolactone (ALDACTONE) tablet 100 mg  100 mg Oral DAILY   ??? [COMPLETED] sodium chloride 0.9 % bolus infusion 1,000 mL  1,000 mL IntraVENous ONCE   ??? [COMPLETED] ondansetron (ZOFRAN) injection 8 mg  8 mg IntraVENous NOW   ??? [COMPLETED] hydromorphone (PF) (DILAUDID) injection 1 mg  1 mg IntraVENous NOW   ??? [COMPLETED] ioversol (OPTIRAY) 320 mg iodine/mL contrast injection 100 mL  100 mL IntraVENous RAD ONCE   ??? [COMPLETED] spironolactone (ALDACTONE) tablet 50 mg  50 mg Oral NOW   ??? [COMPLETED] furosemide (LASIX) tablet 40 mg  40 mg Oral NOW   ??? [COMPLETED] hydromorphone (PF) (DILAUDID) injection 1 mg  1 mg IntraVENous ONCE   ??? hydromorphone (PF) (DILAUDID) injection 1 mg  1 mg IntraVENous Q6H PRN   ??? influenza vaccine 2014-15(52yr+)(PF) (AFLURIA) injection 0.5 mL  0.5 mL IntraMUSCular PRIOR TO DISCHARGE       Assessment/Plan:     Patient Active Problem List   Diagnosis Code   ??? 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 Hepatitis C 070.70   ??? Thrombocytopenia, unspecified 287.5   ??? Alcohol abuse 305.00   ??? Anemia 285.9       Plan:       monitor h/h and transfuse  Nadolol, lasix, aldactone  Fu ascitic fluid  Monitor for etoh withdrawal  Mvi, thiamine  and folate  dvt prophylaxis      Case discussed with:  [] Patient  [] Family  [] Nursing  [] Case Management  DVT Prophylaxis:  [] Lovenox  [] Hep SQ  [] SCDs  [] Coumadin   [] On Heparin gtt    Signed By: Chauncey Reading, MD

## 2012-11-09 NOTE — Other (Deleted)
Thank you for your dx of severe protein calorie malnutrition on H&P,  Record reflects :  bmi 24.25 kg/m2, GI consult documents appearance: well developed, well nourished. admission lab wk Albumin 2.7 , total protein 6.1     The clinical indicators for severe protein calorie malnutrition include:   <75% of energy requirement for > 1 month,   Weight loss:   >5%/1 month, >7.5%/3 months, >10%/6 months, >20%/1 year  Body Fat --loss of SQ fat from orbits, triceps, or fat overlying the ribs- Severe loss  Muscle Mass-- muscle wasting at the temples, clavicles, shoulders, interosseous spaces, scapula, thigh, calf-  Severe wasting  Fluid Accumulation???localized or generalized edema of the extremities, vulva, scrotum- Severe  Grip Strength- measurably decreased    Based on the noted clinical findings, the diagnosis of severe protein calorie malnutrition may be challenged by an external reviewer   please provide supporting evidence to support the diagnosis or amend the diagnosis.     If you DECLINE this query or would like to communicate with CDMP, please utilize the "CDMP message box" right below this Progress Note or in the Rounding Navigator.

## 2012-11-09 NOTE — Other (Signed)
Bedside and Verbal shift change report given to S Braun, RN (oncoming nurse) by Zaneta L Hobbs, RN   (offgoing nurse). Report included the following information SBAR, Kardex, Intake/Output and MAR.

## 2012-11-09 NOTE — H&P (Signed)
[facility]                               [address] [place], [place] [address]                               HISTORY AND PHYSICAL    PATIENT:      [name], [name]  MRN:             [id number]      DATE:   11/08/2012  BILLING:         [id number]   DOB:    [birth year]  ROOM:         [id number]  REFERRING:    NOEL HUNTE, MD  DICTATING:    Jennet Maduro, MD        ATTENDING PHYSICIAN: Jennet Maduro, MD    CHIEF COMPLAINT: "My stomach is in pain."    REASON FOR ADMISSION: Ascites with severe pancytopenia, requiring blood  transfusion.    HISTORY OF PRESENT ILLNESS: This is a 62 year old white male with a history  significant for hepatitis C, cirrhosis of the liver, who apparently has had  blood transfusions in the past, who presented to  [facility]  for evaluation of abdominal pain. The patient states that he felt as though  he was feeling distended and increasing abdominal discomfort that has been  going on for at least the last week. The patient denies any fever or  chills, any nausea or vomiting, any diarrhea or constipation. The patient  does note that he has had problems with his liver, but it is unclear as to  what they were. The patient also, however, notes that he had not been  drinking very much in the recent past; however, patient recently was  celebrating a new achievement in his life and chose to drink some beers. He  believes that he did go overboard and describes himself as drinking a  12-pack on a single day, along with 12 shots for that day alone. States  that he had not been drinking since. While the patient was being evaluated  in the emergency department; however, it was noted that patient had  significant abdominal distention with signs of ascites. The patient had  generalized abdominal pain. Vital signs were stable. Laboratory studies  were concerning. Specifically, the patient was noted to be pancytopenic  with a  white cell count of 3.7, hemoglobin of 6.6, hematocrit of 22.6 and a  platelet count of 56. Additional evaluation studies did reveal the patient  to have an elevated ALT and AST at 84 and 86 respectively, as well as a low  albumin at 2.7. The patient's INR was also checked and noted to be slightly  elevated at 1.3, all of which were concerning for liver disease. Etiology  of the patient's anemia, however, was not entirely clear, as a fecal occult  blood study was obtained and the patient was found to be with no blood in  his stool. Given that the patient has not had any recent evaluation of his  blood count, the chronicity of his anemia could not be determined and it  was suggested to be chronic as patient's vital signs were  stable; however,  it was felt that patient would benefit from being transfused blood. As  such, hospital medicine team at [facility] was called and  patient was accepted in transfer. This was only after a call was placed to  Dr. Eulogio Ditch of gastroenterology.    PAST MEDICAL AND PAST SURGICAL HISTORY  1. Cirrhosis of the liver.  2. Hepatitis, believed to be hepatitis C.  3. History of esophageal varices.  4. History of chronic anemia.  5. Tobacco abuse history.  6. History of alcohol abuse, which patient states that he essentially does  not drink, but does drink on occasion.  7. History of left lower extremity neuropathy.  8. History of right rotator cuff disease.    ALLERGIES: THE PATIENT DENIES ANY KNOWN DRUG ALLERGIES.    MEDICATIONS: The patient is on no medications other than vitamin B12 with  complex.    SOCIAL HISTORY  1. Tobacco use history: The patient states that he quit 12 years prior to  current presentation.  2. Alcohol use history: The patient does not quantify the amount of alcohol  that he drinks. He does note that he has had a regular history of alcohol  use, states that he has decreased his alcohol consumption significantly and  drinks only on occasion; however, the  patient does note that he does drink  heavily at times.  3. Illegal drug use history: The patient denies.  4. Living condition: The patient lives alone.    FAMILY HISTORY: Significant for diabetes, atrial fibrillation, CVA, thyroid  dysfunction, coronary artery disease in father at the age of 49 as well as  brother at the age of 38.    REVIEW OF SYSTEMS: Significant for that which is noted in the HPI. All  other systems are noted as negative. The patient denies any night sweats or  weight loss. The patient denies any hearing impairment, visual impairment,  or gait disturbance. The patient denies any hematochezia or melena, any  hemoptysis or hematemesis or hematuria. The patient denies any recent sick  contacts or illnesses. The patient denies any fever, chills, nausea,  vomiting, diarrhea, constipation, chest pain or palpitations.    PHYSICAL EXAMINATION  MENTAL STATUS: The patient is alert and oriented x3.  VITAL SIGNS: Temperature of 97.4, blood pressure 147/94, pulse of 102,  respirations of 18, oxygen saturation of 93% on room air. GENERAL  APPEARANCE: Patient lying in bed in no acute distress.  HEENT: Head is atraumatic, normocephalic. Pupils are equal, round, reactive  to light. Extraocular muscles are intact. Mucous membranes are pink and  moist. No oral lesions are noted.  NECK: Soft, supple, with no lymphadenopathy, no carotid bruit. No  thyromegaly. No JVD.  CARDIAC: Regular rate and rhythm with no audible murmurs, rubs, or gallops  noted. PULMONARY: Clear to auscultation bilaterally. ABDOMEN: Distended,  generalized tenderness. No point tenderness. No rebound tenderness. Bowel  sounds are noted. Percussion sign is positive.  EXTREMITIES: The patient has no significant edema in lower extremities. The  patient does have 5/5 muscle strength in upper and lower extremities, 2/4  deep tendon reflexes in upper and lower extremities.  SKIN: No petechiae, no ecchymotic lesions, no ulcerations are noted.     ANCILLARY DATA: CBC: White cell count 3.7, hemoglobin 6.6, hematocrit 22.6,  platelet count 56, MCV noted at 82.2.    Electrolytes: Sodium 136, potassium 3.7, chloride 103, bicarbonate 30, BUN  11, creatinine 0.8, glucose 127.    LFTs noted to be with  an AST of 86, ALT of 84, total bilirubin noted at 1.  Albumin noted at 2.7.    Lipase level noted at 303.    INR noted at 1.3.    Lactic acid level noted at 1.    Cardiac enzymes: CPK 252, MB 5.6, troponin less than 0.02.    Urinalysis found to be within normal limits.    Vitamin B12 level noted at 1018.    Folate level noted at 17.9.    Iron level noted at 26.    Ferritin level noted at 14.    TSH noted at 4.22.    Magnesium level 1.5.    Phosphorus level noted at 3.6.    Reticulocyte count noted at 4.2.    Ionized calcium noted at 1.07.    EKG reveals the patient to have normal sinus rhythm with a rate of 96.  There is a short PR interval and some premature atrial complexes.    CT scan of abdomen and pelvis reveals the patient to have small liver with  lobular margin consistent with cirrhosis. No focal lesions are noted. There  is moderately large amount of ascites noted. Multilevel spondylolytic and  disk degenerative changes are noted in the lumbar spine. There is a  periumbilical ventral hernia that is noted as well.    ASSESSMENT  1. This is a 62 year old white male with a history significant for  cirrhosis of the liver secondary to hepatitis C and alcohol abuse, who  presented to the emergency department of [facility] with complaint of  abdominal distention and abdominal pain, found to have a pancytopenia with  significant hemoglobin reduction to 6.6. Fecal occult blood was checked and  found to be negative. Etiology of the patient's anemia is not entirely  clear, however, it was concerning, given patient's borderline tachycardia,  risk of cardiac sequelae from severe anemia, particularly in the presence  of family history of premature coronary artery  disease. As such, patient  was admitted to hospital for treatment of his anemia. Evaluation of the  patient's anemia suggests an iron-deficiency at this point in time, but no  other further determination can be made with regards to  cause of anemia.  The patient would benefit from having iron repletion therapy.  2. Cirrhosis of the liver. The patient shows signs of decompensation of his  liver disease, likely secondary to his recent alcohol use and toxication of  his liver. Alcohol is a toxin to the patient, and patient should drink no  alcohol whatsoever. At this point in time, I believe the patient would  benefit from being placed on diuretic therapy including Lasix and  Aldactone. The patient will also benefit from evaluation from  gastroenterology. A call has already been placed to Dr. Eulogio Ditch of  gastroenterology for evaluation.  3. Hypomagnesemia. The patient shows signs of hypomagnesemia. Will replete  the patient's magnesium.  4. Pancytopenia. The patient is a very pancytopenic. Etiology of patient's  pancytopenia is not entirely clear. It is likely related to his liver  disease, but is unclear whether it is entirely caused by patient's liver  disease. It may actually suggest that patient drinks more alcohol thatn he  is letting on, particularly given that alcohol is a direct marrow toxin.  Given this finding, I do believe it would be prudent to place patient on an  alcohol withdrawal protocol and monitor the patient for signs of alcohol  withdrawal.  5. Severe protein caloric malnutrition. The patient shows signs  of severe  protein caloric malnutrition, this is likely related to liver disease, and  again it may be related to an alcohol abuse issue. As such, as previously  stated, patient likely is drinking more alcohol than he has let on, and  would benefit from being monitored for alcohol withdrawal.    PLAN: As discussed in assessment above.                         Jennet Maduro, MD    OES:wmx  D:  11/09/2012  06:06 A  T: 11/09/2012  09:11 A  Job: [id number]  CScriptDoc #: [id number]  cc:   Jennet Maduro, MD

## 2012-11-09 NOTE — Consults (Signed)
[facility]   [email or web address]      Impression:   1. ETOH cirrhosis with related pancytopenia.  2. Anemia w/o blood loss, multifactorial and due to #1  3. ETOH abuse.      Plan:     1. Supportive care, cont diuretics. Sono guided paracentesis. Cont BB for variceal proph. Transfuse to Hg ~ 9.  2. Not a transplant candidate.      Chief Complaint: swelling      HPI:  [name] is a 62 y.o. male who is being seen on consult for ETOH cirrhosis and ascites. No n/v, no bleeding. No fevers. Actively drinking.    PMH:   Past Medical History   Diagnosis Date   ??? Hypertension    ??? Ill-defined condition      anemia       PSH:   History reviewed. No pertinent past surgical history.    Social HX:   History     Social History   ??? Marital Status: MARRIED     Spouse Name: N/A     Number of Children: N/A   ??? Years of Education: N/A     Occupational History   ??? Not on file.     Social History Main Topics   ??? Smoking status: Former Smoker   ??? Smokeless tobacco: Not on file   ??? Alcohol Use: Yes      Comment: occasional   ??? Drug Use: No   ??? Sexually Active: Not on file     Other Topics Concern   ??? Not on file     Social History Narrative   ??? No narrative on file       FHX:   Family History   Problem Relation Age of Onset   ??? Heart Disease Father 33     MI   ??? Heart Disease Brother        Allergy:   No Known Allergies    Home Medications:     No prescriptions prior to admission       Review of Systems:     Constitutional: No fevers, chills, weight loss, fatigue.   Skin: No rashes, pruritis, jaundice, ulcerations, erythema.   HENT: No headaches, nosebleeds, sinus pressure, rhinorrhea, sore throat.   Eyes: No visual changes, blurred vision, eye pain, photophobia, jaundice.   Cardiovascular: No chest pain, heart palpitations.   Respiratory: No cough, SOB, wheezing, chest discomfort, orthopnea.   Gastrointestinal: abd pain   Genitourinary: No dysuria, bleeding, discharge,  pyuria.   Musculoskeletal: No weakness, arthralgias, wasting.   Endo: No sweats.   Heme: No bruising, easy bleeding.   Allergies: As noted.   Neurological: Cranial nerves intact.  Alert and oriented. Gait not assessed.   Psychiatric:  No anxiety, depression, hallucinations.                 BP 121/70   Pulse 98   Temp(Src) 98.1 ??F (36.7 ??C)   Resp 20   Ht 5\' 10"  (1.778 m)   Wt 76.658 kg (169 lb)   BMI 24.25 kg/m2   SpO2 92%    Physical Assessment:     constitutional: appearance: well developed, well nourished, obese, no deformities, in no acute distress.   skin: inspection: no rashes, ulcers, icterus or other lesions; no clubbing or telangiectasias. palpation: no induration or subcutaneos nodules.   eyes: inspection: normal conjunctivae and lids; no jaundice pupils: symmetrical, normoreactive to light, normal accommodation and size.  ENMT: mouth: normal oral mucosa,lips and gums; good dentition. oropharynx: normal tongue, hard and soft palate; posterior pharynx without erithema, exudate or lesions.   neck: thyroid: normal size, consistency and position; no masses or tenderness.   respiratory: effort: normal chest excursion; no intercostal retraction or accessory muscle use.   cardiovascular: abdominal aorta: normal size and position; no bruits. palpation: PMI of normal size and position; normal rhythm; no thrill or murmurs.   abdominal: abdomen: ascites. hernias: umbilical hernias appreciated. liver: normal size and consistency. spleen: not palpable.   rectal: hemoccult/guaiac: not performed.   musculoskeletal: digits and nails: no clubbing, cyanosis, petechiae or other inflammatory conditions. gait: normal gait and station head and neck: normal range of motion; no pain, crepitation or contracture. spine/ribs/pelvis: normal range of motion; no pain, deformity or contracture.   lymphatic: axilae: not palpable. groin: not palpable. neck: within normal limits. other: not palpable.   neurologic: cranial nerves: II-XII  normal.   psychiatric: judgement/insight: within normal limits. memory: within normal limits for recent and remote events. mood and affect: no evidence of depression, anxiety or agitation. orientation: oriented to time, space and person.        Basic Metabolic Profile   Recent Labs      11/09/12   0650  11/08/12   2325   NA  137   --    K  3.6   --    CL  102   --    CO2  29   --    BUN  10   --    GLU  93   --    CA  7.4*   --    MG   --   1.5*   PHOS   --   3.6         CBC w/Diff    Recent Labs      11/08/12   1420   WBC  3.7*   RBC  2.75*   HGB  6.6*   HCT  22.6*   MCV  82.2   MCH  24.0   MCHC  29.2*   RDW  19.5*   PLT  56*    Recent Labs      11/08/12   1420   GRANS  58   LYMPH  20*   EOS  7*        Hepatic Function   Recent Labs      11/09/12   0650  11/08/12   1420   ALB  3.0*  2.7*   TP  6.3*  6.1*   TBILI  3.0*  1.0   SGOT  104*  86*   AP  90  112   LPSE   --   303          Austyn Seier A Marq Rebello, MD, M.D.   [facility] [place]  [phone]  [email or web address]

## 2012-11-09 NOTE — Progress Notes (Signed)
Problem: Patient Education: Go to Patient Education Activity  Goal: Patient/Family Education  Outcome: Progressing Towards Goal  Patient understands d/c goals

## 2012-11-09 NOTE — Progress Notes (Signed)
Chaplain conducted a Follow up consultation and Spiritual Assessment for [name], who is a 61 y.o.,male.      The Chaplain provided the following Interventions:  Continued the relationship of care and support.   Listened empathically.  Offered prayer and assurance of continued prayer on patients behalf.   Chart reviewed.    The following outcomes were achieved:  Patient expressed gratitude for pastoral care visit.    Assessment:  There are no spiritual or religious issues which require intervention at this time.     Plan:  Chaplains will continue to follow and will provide pastoral care on an as needed/requested basis.  Chaplain recommends bedside caregivers page chaplain on duty if patient shows signs of acute spiritual or emotional distress.       Patsy Lager  Chaplain Resident  Spiritual Care  [phone]

## 2012-11-09 NOTE — Other (Signed)
BS 273 - Pt currently eating watermelon.

## 2012-11-09 NOTE — Progress Notes (Signed)
Problem: Discharge Planning  Goal: *Discharge to safe environment  Outcome: Progressing Towards Goal  D/c home

## 2012-11-09 NOTE — Progress Notes (Signed)
62 yo male admitted from ED with abdominal pain and distention. He has cirrhosis of liver.  He is being scheduled for paracentesis. He also is noted to be anemic.  He is A&O x 4, states he is independent in his care.  He expects to  return home upon d/c from hospital.  Will follow.  Avel Sensor RN-CM

## 2012-11-09 NOTE — Progress Notes (Signed)
Chaplain  Initiated a relationship of care and support.   Listened as Pt. Shared story of health and life.  Provided info about spiritual care services.    Patient:  Expressed thanks for the visit.  Said the talk was what he needed today.    [name]  Chaplain Resident  Spiritual Care  [phone]

## 2012-11-09 NOTE — Procedures (Signed)
Procedures  by Chanda Busing, MD at 11/09/12 1510                Author: Chanda Busing, MD  Service: RADIOLOGY  Author Type: Physician       Filed: 11/09/12 1511  Date of Service: 11/09/12 1510  Status: Signed          Editor: Chanda Busing, MD (Physician)                         RADIOLOGY POST PROCEDURE NOTE          November 09, 2012       3:10 PM        Preoperative Diagnosis:   Ascites.      Postoperative Diagnosis:  Same.      Operator:  Dr. Larry Sierras      Assistant:  None.      Type of Anesthesia: 1% plain lidocaine      Procedure/Description:  US guided paracentesis      Findings:   See rpeort.      Estimated blood Loss:  Minimal      Specimen Removed:   yes      Blood transfusions:  None.      Implants:  None.      Complications: None      Condition: Stable      Discharge Plan:  continue present therapy      Delia Heady, MD

## 2012-11-10 LAB — CBC WITH AUTOMATED DIFF
ABS. BASOPHILS: 0 10*3/uL (ref 0.0–0.06)
ABS. EOSINOPHILS: 0.3 10*3/uL (ref 0.0–0.4)
ABS. LYMPHOCYTES: 0.8 10*3/uL — ABNORMAL LOW (ref 0.9–3.6)
ABS. MONOCYTES: 1 10*3/uL (ref 0.05–1.2)
ABS. NEUTROPHILS: 2.3 10*3/uL (ref 1.8–8.0)
BASOPHILS: 1 % (ref 0–2)
EOSINOPHILS: 7 % — ABNORMAL HIGH (ref 0–5)
HCT: 23.9 % — ABNORMAL LOW (ref 36.0–48.0)
HGB: 7.3 g/dL — ABNORMAL LOW (ref 13.0–16.0)
LYMPHOCYTES: 19 % — ABNORMAL LOW (ref 21–52)
MCH: 24.6 PG (ref 24.0–34.0)
MCHC: 30.5 g/dL — ABNORMAL LOW (ref 31.0–37.0)
MCV: 80.5 FL (ref 74.0–97.0)
MONOCYTES: 22 % — ABNORMAL HIGH (ref 3–10)
MPV: 10.6 FL (ref 9.2–11.8)
NEUTROPHILS: 51 % (ref 40–73)
PLATELET: 48 10*3/uL — ABNORMAL LOW (ref 135–420)
RBC: 2.97 M/uL — ABNORMAL LOW (ref 4.70–5.50)
RDW: 18.7 % — ABNORMAL HIGH (ref 11.6–14.5)
WBC: 4.4 10*3/uL — ABNORMAL LOW (ref 4.6–13.2)

## 2012-11-10 LAB — HEPATIC FUNCTION PANEL
A-G Ratio: 0.9 (ref 0.8–1.7)
ALT (SGPT): 78 U/L — ABNORMAL HIGH (ref 16–61)
AST (SGOT): 94 U/L — ABNORMAL HIGH (ref 15–37)
Albumin: 2.8 g/dL — ABNORMAL LOW (ref 3.4–5.0)
Alk. phosphatase: 86 U/L (ref 45–117)
Bilirubin, direct: 0.7 MG/DL — ABNORMAL HIGH (ref 0.0–0.2)
Bilirubin, total: 1.9 MG/DL — ABNORMAL HIGH (ref 0.2–1.0)
Globulin: 3.1 g/dL (ref 2.0–4.0)
Protein, total: 5.9 g/dL — ABNORMAL LOW (ref 6.4–8.2)

## 2012-11-10 LAB — METABOLIC PANEL, BASIC
Anion gap: 6 mmol/L (ref 3.0–18)
BUN/Creatinine ratio: 13 (ref 12–20)
BUN: 12 MG/DL (ref 7.0–18)
CO2: 31 mmol/L (ref 21–32)
Calcium: 7.2 MG/DL — ABNORMAL LOW (ref 8.5–10.1)
Chloride: 100 mmol/L (ref 100–108)
Creatinine: 0.9 MG/DL (ref 0.6–1.3)
GFR est AA: >60 mL/min/{1.73_m2} (ref >60)
GFR est non-AA: >60 mL/min/{1.73_m2} (ref >60)
Glucose: 113 mg/dL — ABNORMAL HIGH (ref 74–99)
Potassium: 3.4 mmol/L — ABNORMAL LOW (ref 3.5–5.5)
Sodium: 137 mmol/L (ref 136–145)

## 2012-11-10 LAB — LIPID PANEL
CHOL/HDL Ratio: 2.2 (ref 0–5.0)
Cholesterol, total: 96 MG/DL (ref <200)
HDL Cholesterol: 44 MG/DL (ref 40–60)
LDL, calculated: 42.8 MG/DL (ref 0–100)
Triglyceride: 46 MG/DL (ref <150)
VLDL, calculated: 9.2 MG/DL

## 2012-11-10 LAB — PROTHROMBIN TIME + INR
INR: 1.3 — ABNORMAL HIGH (ref 0.8–1.2)
Prothrombin time: 16.1 s — ABNORMAL HIGH (ref 11.5–15.2)

## 2012-11-10 MED ADMIN — therapeutic multivitamin (THERAGRAN) tablet 1 tablet: ORAL | @ 12:00:00

## 2012-11-10 MED ADMIN — temazepam (RESTORIL) capsule 7.5 mg: ORAL | @ 03:00:00 | NDC 68084054911

## 2012-11-10 MED ADMIN — hydromorphone (PF) (DILAUDID) injection 1 mg: INTRAVENOUS | @ 10:00:00 | NDC 00409128331

## 2012-11-10 MED ADMIN — potassium chloride (K-DUR, KLOR-CON) SR tablet 20 mEq: ORAL | @ 12:00:00 | NDC 00245005889

## 2012-11-10 MED ADMIN — hydromorphone (PF) (DILAUDID) injection 1 mg: INTRAVENOUS | @ 04:00:00 | NDC 00409128331

## 2012-11-10 MED ADMIN — furosemide (LASIX) injection 40 mg: INTRAVENOUS | @ 12:00:00 | NDC 00409610204

## 2012-11-10 MED ADMIN — influenza vaccine 2014-15(5yr+)(PF) (AFLURIA) injection 0.5 mL: INTRAMUSCULAR | @ 16:00:00 | NDC 33332001402

## 2012-11-10 MED ADMIN — potassium chloride 10 mEq and lidocaine 10 mg in 100 ml NS IVPB: INTRAVENOUS | @ 16:00:00 | NDC 63323049227

## 2012-11-10 MED ADMIN — spironolactone (ALDACTONE) tablet 100 mg: ORAL | @ 12:00:00 | NDC 68084020611

## 2012-11-10 MED ADMIN — lorazepam (ATIVAN) tablet 1 mg: ORAL | @ 10:00:00 | NDC 68084008911

## 2012-11-10 MED ADMIN — nadolol (CORGARD) tablet 20 mg: ORAL | @ 12:00:00 | NDC 51079081201

## 2012-11-10 MED ADMIN — sodium chloride (NS) flush 5-10 mL: INTRAVENOUS | @ 02:00:00 | NDC 87701099893

## 2012-11-10 MED ADMIN — sodium chloride (NS) flush 5-10 mL: INTRAVENOUS | @ 10:00:00 | NDC 87701099893

## 2012-11-10 MED ADMIN — folic acid (FOLVITE) tablet 1 mg: ORAL | @ 12:00:00 | NDC 62584089711

## 2012-11-10 MED ADMIN — docusate sodium (COLACE) capsule 100 mg: ORAL | @ 12:00:00 | NDC 62584068311

## 2012-11-10 MED ADMIN — hydromorphone (PF) (DILAUDID) injection 1 mg: INTRAVENOUS | @ 16:00:00 | NDC 00409128331

## 2012-11-10 MED ADMIN — thiamine (B-1) tablet 100 mg: ORAL | @ 12:00:00 | NDC 90011015050

## 2012-11-10 NOTE — Progress Notes (Signed)
No bleeding. Had ascitic tap.   PE: BP 109/73   Pulse 86   Temp(Src) 97.3 ??F (36.3 ??C)   Resp 18   Ht 5\' 10"  (1.778 m)   Wt 75.796 kg (167 lb 1.6 oz)   BMI 23.98 kg/m2   SpO2 95%  Abdomen benign  Lungs CTA  Recent Results (from the past 12 hour(s))   LIPID PANEL    Collection Time     11/10/12  4:50 AM       Result Value Range    LIPID PROFILE          Cholesterol, total 96  <200 MG/DL    Triglyceride 46  <161 MG/DL    HDL Cholesterol 44  40 - 60 MG/DL    LDL, calculated 09.6  0 - 045 MG/DL    VLDL, calculated 9.2      CHOL/HDL Ratio 2.2  0 - 5.0     CBC WITH AUTOMATED DIFF    Collection Time     11/10/12  4:50 AM       Result Value Range    WBC 4.4 (*) 4.6 - 13.2 K/uL    RBC 2.97 (*) 4.70 - 5.50 M/uL    HGB 7.3 (*) 13.0 - 16.0 g/dL    HCT 40.9 (*) 81.1 - 48.0 %    MCV 80.5  74.0 - 97.0 FL    MCH 24.6  24.0 - 34.0 PG    MCHC 30.5 (*) 31.0 - 37.0 g/dL    RDW 91.4 (*) 78.2 - 14.5 %    PLATELET 48 (*) 135 - 420 K/uL    MPV 10.6  9.2 - 11.8 FL    NEUTROPHILS 51  40 - 73 %    LYMPHOCYTES 19 (*) 21 - 52 %    MONOCYTES 22 (*) 3 - 10 %    EOSINOPHILS 7 (*) 0 - 5 %    BASOPHILS 1  0 - 2 %    ABS. NEUTROPHILS 2.3  1.8 - 8.0 K/UL    ABS. LYMPHOCYTES 0.8 (*) 0.9 - 3.6 K/UL    ABS. MONOCYTES 1.0  0.05 - 1.2 K/UL    ABS. EOSINOPHILS 0.3  0.0 - 0.4 K/UL    ABS. BASOPHILS 0.0  0.0 - 0.06 K/UL    DF AUTOMATED     METABOLIC PANEL, BASIC    Collection Time     11/10/12  4:50 AM       Result Value Range    Sodium 137  136 - 145 mmol/L    Potassium 3.4 (*) 3.5 - 5.5 mmol/L    Chloride 100  100 - 108 mmol/L    CO2 31  21 - 32 mmol/L    Anion gap 6  3.0 - 18 mmol/L    Glucose 113 (*) 74 - 99 mg/dL    BUN 12  7.0 - 18 MG/DL    Creatinine 9.56  0.6 - 1.3 MG/DL    BUN/Creatinine ratio 13  12 - 20      GFR est AA >60  >60 ml/min/1.25m2    GFR est non-AA >60  >60 ml/min/1.93m2    Calcium 7.2 (*) 8.5 - 10.1 MG/DL   HEPATIC FUNCTION PANEL    Collection Time     11/10/12  4:50 AM       Result Value Range    Protein, total 5.9 (*) 6.4 - 8.2 g/dL     Albumin 2.8 (*) 3.4 - 5.0 g/dL  Globulin 3.1  2.0 - 4.0 g/dL    A-G Ratio 0.9  0.8 - 1.7      Bilirubin, total 1.9 (*) 0.2 - 1.0 MG/DL    Bilirubin, direct 0.7 (*) 0.0 - 0.2 MG/DL    Alk. phosphatase 86  45 - 117 U/L    AST 94 (*) 15 - 37 U/L    ALT 78 (*) 16 - 61 U/L   PROTHROMBIN TIME + INR    Collection Time     11/10/12  4:50 AM       Result Value Range    Prothrombin time 16.1 (*) 11.5 - 15.2 sec    INR 1.3 (*) 0.8 - 1.2       A/P: Cirrhosis with portal htn. EGD earlier this year was negative. Start PO diuretics. Patient to avoid ETOH.  Will follow as out patient. Will need to investigate further regards viral hepatitis hepatoma. Will sign off call us as needed.  A Wirt Hemmerich MD

## 2012-11-10 NOTE — Progress Notes (Signed)
[facility]  Progress Note    Patient: [name] Age: 62 y.o. DOB: 03-Apr-1950 MR#: [id number] SSN: [id number]  Date/Time: 11/10/2012 10:24 AM    Subjective:     Currently receiving blood transfusion, pains controlled    Objective:   VS: BP 103/70   Pulse 72   Temp(Src) 98.2 ??F (36.8 ??C)   Resp 20   Ht 5\' 10"  (1.778 m)   Wt 75.796 kg (167 lb 1.6 oz)   BMI 23.98 kg/m2   SpO2 93%   Tmax/24hrs: Temp (24hrs), Avg:98.2 ??F (36.8 ??C), Min:97.3 ??F (36.3 ??C), Max:99.2 ??F (37.3 ??C)     Input/Output:     Intake/Output Summary (Last 24 hours) at 11/10/12 1119  Last data filed at 11/10/12 0914   Gross per 24 hour   Intake   1040 ml   Output    785 ml   Net    255 ml       General:  nad  Cardiovascular:  s1s2  Pulmonary:  clear  GI:  Distended, bs +  Extremities:  2 + le edema  Additional:  No skin changes  Neuro:  Non focal    Labs:    Recent Results (from the past 24 hour(s))   CARDIAC PANEL,(CK, CKMB & TROPONIN)    Collection Time     11/09/12 12:14 PM       Result Value Range    CK 260  39 - 308 U/L    CK - MB 10.6 (*) 0.5 - 3.6 ng/ml    CK-MB Index 4.1 (*) 0.0 - 4.0 %    Troponin-I, Qt. <0.02  0.0 - 0.045 NG/ML   CBC WITH AUTOMATED DIFF    Collection Time     11/09/12 12:14 PM       Result Value Range    WBC 4.6  4.6 - 13.2 K/uL    RBC 3.21 (*) 4.70 - 5.50 M/uL    HGB 8.2 (*) 13.0 - 16.0 g/dL    HCT 78.2 (*) 95.6 - 48.0 %    MCV 81.3  74.0 - 97.0 FL    MCH 25.5  24.0 - 34.0 PG    MCHC 31.4  31.0 - 37.0 g/dL    RDW 21.3 (*) 08.6 - 14.5 %    PLATELET 51 (*) 135 - 420 K/uL    MPV 10.6  9.2 - 11.8 FL    NEUTROPHILS 63  40 - 73 %    LYMPHOCYTES 10 (*) 21 - 52 %    MONOCYTES 21 (*) 3 - 10 %    EOSINOPHILS 6 (*) 0 - 5 %    BASOPHILS 0  0 - 2 %    ABS. NEUTROPHILS 2.9  1.8 - 8.0 K/UL    ABS. LYMPHOCYTES 0.5 (*) 0.9 - 3.6 K/UL    ABS. MONOCYTES 1.0  0.05 - 1.2 K/UL    ABS. EOSINOPHILS 0.3  0.0 - 0.4 K/UL    ABS. BASOPHILS 0.0  0.0 - 0.1 K/UL    DF AUTOMATED     CELL COUNT AND DIFF, BODY  FLUID    Collection Time     11/09/12  4:00 PM       Result Value Range    FLUID TYPE: PARACENTESIS FLUID      FLUID COLOR PALE YELLOW      FLUID APPEARANCE HAZY      FLUID RBC COUNT 134 (*) NRRE /cu mm  FLD NUCLEATED CELLS 245 (*) NRRE /cu mm    FLD SEGS 8      FLD BANDS 0      FLD LYMPHS 20      FLD MONOCYTES 29      FLD EOSINS 1      MACROPHAGE 42      WBC COMMENTS FEW MESOTHELIAL CELLS SEEN     GLUCOSE, POC    Collection Time     11/09/12  4:04 PM       Result Value Range    Glucose (POC) 145 (*) 70 - 110 mg/dL   LIPID PANEL    Collection Time     11/10/12  4:50 AM       Result Value Range    LIPID PROFILE          Cholesterol, total 96  <200 MG/DL    Triglyceride 46  <045 MG/DL    HDL Cholesterol 44  40 - 60 MG/DL    LDL, calculated 40.9  0 - 811 MG/DL    VLDL, calculated 9.2      CHOL/HDL Ratio 2.2  0 - 5.0     CBC WITH AUTOMATED DIFF    Collection Time     11/10/12  4:50 AM       Result Value Range    WBC 4.4 (*) 4.6 - 13.2 K/uL    RBC 2.97 (*) 4.70 - 5.50 M/uL    HGB 7.3 (*) 13.0 - 16.0 g/dL    HCT 91.4 (*) 78.2 - 48.0 %    MCV 80.5  74.0 - 97.0 FL    MCH 24.6  24.0 - 34.0 PG    MCHC 30.5 (*) 31.0 - 37.0 g/dL    RDW 95.6 (*) 21.3 - 14.5 %    PLATELET 48 (*) 135 - 420 K/uL    MPV 10.6  9.2 - 11.8 FL    NEUTROPHILS 51  40 - 73 %    LYMPHOCYTES 19 (*) 21 - 52 %    MONOCYTES 22 (*) 3 - 10 %    EOSINOPHILS 7 (*) 0 - 5 %    BASOPHILS 1  0 - 2 %    ABS. NEUTROPHILS 2.3  1.8 - 8.0 K/UL    ABS. LYMPHOCYTES 0.8 (*) 0.9 - 3.6 K/UL    ABS. MONOCYTES 1.0  0.05 - 1.2 K/UL    ABS. EOSINOPHILS 0.3  0.0 - 0.4 K/UL    ABS. BASOPHILS 0.0  0.0 - 0.06 K/UL    DF AUTOMATED     METABOLIC PANEL, BASIC    Collection Time     11/10/12  4:50 AM       Result Value Range    Sodium 137  136 - 145 mmol/L    Potassium 3.4 (*) 3.5 - 5.5 mmol/L    Chloride 100  100 - 108 mmol/L    CO2 31  21 - 32 mmol/L    Anion gap 6  3.0 - 18 mmol/L    Glucose 113 (*) 74 - 99 mg/dL    BUN 12  7.0 - 18 MG/DL    Creatinine 0.86  0.6 - 1.3 MG/DL     BUN/Creatinine ratio 13  12 - 20      GFR est AA >60  >60 ml/min/1.11m2    GFR est non-AA >60  >60 ml/min/1.29m2    Calcium 7.2 (*) 8.5 - 10.1 MG/DL   HEPATIC FUNCTION PANEL    Collection Time  11/10/12  4:50 AM       Result Value Range    Protein, total 5.9 (*) 6.4 - 8.2 g/dL    Albumin 2.8 (*) 3.4 - 5.0 g/dL    Globulin 3.1  2.0 - 4.0 g/dL    A-G Ratio 0.9  0.8 - 1.7      Bilirubin, total 1.9 (*) 0.2 - 1.0 MG/DL    Bilirubin, direct 0.7 (*) 0.0 - 0.2 MG/DL    Alk. phosphatase 86  45 - 117 U/L    AST 94 (*) 15 - 37 U/L    ALT 78 (*) 16 - 61 U/L   PROTHROMBIN TIME + INR    Collection Time     11/10/12  4:50 AM       Result Value Range    Prothrombin time 16.1 (*) 11.5 - 15.2 sec    INR 1.3 (*) 0.8 - 1.2       Additional Data Reviewed:      Current Facility-Administered Medications   Medication Dose Route Frequency   ??? potassium chloride 10 mEq and lidocaine 10 mg in 100 ml NS IVPB   IntraVENous ONCE   ??? [COMPLETED] potassium chloride (K-DUR, KLOR-CON) SR tablet 20 mEq  20 mEq Oral NOW   ??? temazepam (RESTORIL) capsule 7.5 mg  7.5 mg Oral QHS PRN   ??? ondansetron (ZOFRAN) injection 4 mg  4 mg IntraVENous Q8H PRN   ??? docusate sodium (COLACE) capsule 100 mg  100 mg Oral BID   ??? furosemide (LASIX) injection 20 mg  20 mg IntraVENous PRN   ??? furosemide (LASIX) injection 40 mg  40 mg IntraVENous BID   ??? sodium chloride (NS) flush 5-10 mL  5-10 mL IntraVENous Q8H   ??? sodium chloride (NS) flush 5-10 mL  5-10 mL IntraVENous PRN   ??? lorazepam (ATIVAN) tablet 0.5 mg  0.5 mg Oral Q6H   ??? lorazepam (ATIVAN) tablet 1 mg  1 mg Oral Q6H   ??? [EXPIRED] lorazepam (ATIVAN) injection 1 mg  1 mg IntraVENous Q15MIN PRN   ??? folic acid (FOLVITE) tablet 1 mg  1 mg Oral DAILY   ??? thiamine (B-1) tablet 100 mg  100 mg Oral DAILY   ??? therapeutic multivitamin (THERAGRAN) tablet 1 tablet  1 tablet Oral DAILY   ??? spironolactone (ALDACTONE) tablet 100 mg  100 mg Oral DAILY   ??? nadolol (CORGARD) tablet 20 mg  20 mg Oral DAILY   ??? hydromorphone (PF)  (DILAUDID) injection 1 mg  1 mg IntraVENous Q6H PRN   ??? influenza vaccine 2014-15(30yr+)(PF) (AFLURIA) injection 0.5 mL  0.5 mL IntraMUSCular PRIOR TO DISCHARGE       Assessment/Plan:     Patient Active Problem List   Diagnosis Code   ??? 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 Hepatitis C 070.70   ??? Thrombocytopenia, unspecified 287.5   ??? Alcohol abuse 305.00   ??? Anemia 285.9       Plan:      Ok to dc home  Nadolol, lasix, aldactone  Fu ascitic fluid  Monitor for etoh withdrawal  Mvi, thiamine and folate  dvt prophylaxis      Case discussed with:  [] Patient  [] Family  [] Nursing  [] Case Management  DVT Prophylaxis:  [] Lovenox  [] Hep SQ  [] SCDs  [] Coumadin   [] On Heparin gtt    Signed By: Chauncey Reading, MD

## 2012-11-10 NOTE — Discharge Summary (Signed)
[facility]                               [address] [place], [place] [address]                                 DISCHARGE SUMMARY    PATIENT:   [name], [name]  MRN:           [id number]    ADMIT DATE:    11/08/2012  BILLING:       [id number] [facility] DATE:  ATTENDING: Jennet Maduro, MD  DICTATING  Chauncey Reading, MD      He does not have a primary care physician. He is currently trying to  establish himself with a primary care doctor.    DISCHARGE DIAGNOSES: Include  1. Alcoholic cirrhosis with related pancytopenia.  2. Spontaneous bacterial peritonitis.    SECONDARY DIAGNOSES  1. Ongoing alcohol abuse.  2. Hepatitis C.  3. Cirrhosis.    CONSULTANTS: Dr. Reesa Chew from Gastroenterology.    PROCEDURES  1. Paracentesis showing 245 WBCs.  2. CT abdomen and pelvis 11/08/2012 showing small liver with lobular margin  consistent with cirrhosis. No focal lesions, splenomegaly. Moderately large  amount of ascites, multilevel spondylitic and disk degenerative changes in  the lumbar spine. There may be a periumbilical ventral hernia containing  some fluid. No other significant abnormality is seen.  3. Chest x-ray, 11/08/2012, normal chest.  4. Ultrasound-guided paracenteses as mentioned above, after 1.16 liters of  removal.    HISTORY OF PRESENT ILLNESS AND HOSPITAL COURSE: The patient is a  62 year old male who has a past medical history as mentioned above. He  presented to the hospital as he was having abdominal pain. underwent  ultrasound-guided again had a paracentesis. The ascitic fluid does have  evidence of some WBCs, 245. He was placed on Levaquin. He has not been on  Levaquin in the past. NO KNOWN DRUG ALLERGIES. BUN and creatinine are 12  and 0.9. He, during his hospitalization, has gotten 2 units of packed RBCs.  He has been afebrile during his hospitalization. Does not have an elevated  white count. He does remain thrombocytopenic, most  likely secondary to his  alcohol abuse, as well, in addition to his thrombocytopenia there is  evidence of pancytopenia, most likely due to his alcohol abuse. He  continues to drink. Hepatitis panel is positive for hepatitis C in June  2013. He is not currently getting treatment for hepatitis C. He is  discharged home.    DISCHARGE MEDICATIONS  1. Levaquin 750 every day #10.  2. Colace as needed.  3. Folic acid 1 mg daily.  4. Nadolol 20 daily.  5. Aldactone 100 daily.  6. Thiamine B1 1 tablet daily.  7. Multivitamin daily.    DISCHARGE INSTRUCTIONS: He is discharged home. He should be on a cardiac,  low sodium diet. He needs to followup with a PCP as he does not have 1 as  of yet. During his hospitalization, it was noted that he had received some  Dilaudid. He tells me that he is scheduled for a preemployment physical  with a urine drug screen. It is of note, because of the Dilaudid that  was  administered on 11/08/2012, his urine drug screen, there is a chance it  might test positive for opiates.                     Chauncey Reading, MD    MSC:wmx  D: 11/10/2012 T: 11/10/2012 12:38 P  Job: [id number]  CScriptDoc #: [id number]  cc:   Chauncey Reading, MD        Jennet Maduro, MD

## 2012-11-10 NOTE — Progress Notes (Signed)
Discharge instructions provided to patient and wife. IV removed Tele removed.

## 2012-11-11 LAB — TYPE + CROSSMATCH
ABO/Rh(D): A POS
Antibody screen: NEGATIVE
Status of unit: TRANSFUSED
Status of unit: TRANSFUSED
Status of unit: TRANSFUSED
Unit division: 0
Unit division: 0
Unit division: 0

## 2012-11-12 LAB — RBC, FOLATE
Folate, Hemolysate: 506.5 ng/mL
Folate, RBC: 2271 ng/mL — ABNORMAL HIGH (ref 499–1504)
HCT: 22.3 % — ABNORMAL LOW (ref 37.5–51.0)

## 2012-11-12 LAB — HOMOCYSTEINE, PLASMA: Homocysteine, plasma: 8.9 umol/L (ref 0.0–15.0)

## 2012-12-19 MED ADMIN — oxycodone-acetaminophen (PERCOCET) 5-325 mg per tablet 2 tablet: ORAL | @ 18:00:00 | NDC 00406051223

## 2012-12-19 NOTE — ED Notes (Signed)
Patient discharged alert and oriented x 3, denies chest pain or SOB at this time.  Electronic signature pad  unavailable, patient agreed to discharge plan.  I have reviewed discharge instructions with the patient.  The patient verbalized understanding.
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
## 2012-12-19 NOTE — ED Notes (Signed)
Patient c/o left knee pain x several days.  Patient denies injury / trauma.

## 2012-12-19 NOTE — ED Provider Notes (Signed)
I was personally available for consultation in the emergency department. I have reviewed the chart prior to the patient's discharge and agree with the documentation recorded by the MLP, including the assessment, treatment plan, and disposition.

## 2013-01-19 DIAGNOSIS — K766 Portal hypertension: Secondary | ICD-10-CM

## 2013-01-19 NOTE — ED Notes (Signed)
Pt made aware of need for urine specimen.  Verbalized understanding.

## 2013-01-19 NOTE — ED Provider Notes (Addendum)
HPI Comments: [name] is a 62 y.o. Male with PMHx of HTN, alcoholic cirrhosis, hep C and Varices presented to the ED with c/o rectal bleeding and coffee ground emesis with onset earlier today.  Pt also stated that "I just came home from [place], and since then Reserve been feeling weak".  Pt also stated that he recently "relapsed" and has been drinking over the holiday.  H notes some chest pain associated with his symptoms today.  He denies any current chest pain, no dyspnea, no fevers or chills, no abdominal pain, no syncope.  Pt reports having prior history of endoscopy and colonoscopy, "the last being a few months ago" but does not remember and findings from the procedures.  Pt stated that currently he does not have a GI or PCP and does not take any medications.  No other symptoms or complaints were presented at this time.       Patient is a 62 y.o. male presenting with anal bleeding, hematemesis, and abdominal pain. The history is provided by the patient.   Rectal Bleeding  Associated symptoms include abdominal pain.   Blood in Vomit  Associated symptoms include abdominal pain.   Abdominal Pain   Associated symptoms include vomiting.        Past Medical History   Diagnosis Date   ??? Hypertension    ??? Ill-defined condition      anemia   ??? Anemia    ??? Varices         History reviewed. No pertinent past surgical history.      Family History   Problem Relation Age of Onset   ??? Heart Disease Father 31     MI   ??? Heart Disease Brother         History     Social History   ??? Marital Status: DIVORCED     Spouse Name: N/A     Number of Children: N/A   ??? Years of Education: N/A     Occupational History   ??? Not on file.     Social History Main Topics   ??? Smoking status: Former Smoker   ??? Smokeless tobacco: Not on file   ??? Alcohol Use: Yes      Comment: occasional   ??? Drug Use: No   ??? Sexually Active: Not on file     Other Topics Concern   ??? Not on file     Social History Narrative   ??? No narrative on file                   ALLERGIES: Review of patient's allergies indicates no known allergies.      Review of Systems   Constitutional: Negative.    HENT: Negative.    Eyes: Negative.    Respiratory: Negative.    Cardiovascular: Negative.    Gastrointestinal: Positive for vomiting, abdominal pain, anal bleeding and hematemesis.   Endocrine: Negative.    Genitourinary: Negative.    Musculoskeletal: Negative.    Skin: Negative.    Allergic/Immunologic: Negative.    Neurological: Negative.    Hematological: Negative.    Psychiatric/Behavioral: Negative.    All other systems reviewed and are negative.        Filed Vitals:    01/19/13 2047   BP: 113/66   Pulse: 105   Temp: 97.6 ??F (36.4 ??C)   Resp: 18   Height: 5\' 10"  (1.778 m)   Weight: 104.327 kg (230 lb)   SpO2:  100%            Physical Exam   Vitals reviewed.  Constitutional: He is oriented to person, place, and time. He appears well-developed and well-nourished. No distress.   Resting comfortably on stretcher, possibly appears slightly jaundiced   HENT:   Head: Normocephalic and atraumatic.   MM moist   Eyes: Conjunctivae and EOM are normal. No scleral icterus.   Sclera clear bilaterally   Neck: Neck supple. No JVD present.   Non-tender to palpation   Cardiovascular: Normal rate, regular rhythm and normal heart sounds.  Exam reveals no gallop and no friction rub.    No murmur heard.  Pulmonary/Chest: Effort normal and breath sounds normal. No respiratory distress. He has no wheezes. He has no rales. He exhibits no tenderness.   No crepitance with palpation   Abdominal: Soft. Bowel sounds are normal. He exhibits no distension and no mass. There is tenderness. There is no rebound and no guarding.   Mild RUQ tenderness, no rebound or guarding.  No significant fluid wave appreciated.   Genitourinary: Guaiac positive stool.   No CVA tenderness.  No masses palpated on DRE.  Dark brown stool in vault, guaiac positive.   Musculoskeletal: He exhibits no edema and no tenderness.   Normal  inspection of upper extremities.  No edema noted to bilateral lower extremities   Lymphadenopathy:     He has no cervical adenopathy.   Neurological: He is alert and oriented to person, place, and time. No cranial nerve deficit. He exhibits normal muscle tone.   Normal motor and sensation bilaterally to upper and lower extremities   Skin: Skin is warm and dry. No rash noted. He is not diaphoretic.   Psychiatric:   Normal mood and affect.          MDM     Differential Diagnosis; Clinical Impression; Plan:     Pt with history of varices and cirrhosis, presents to ED with coffee ground emesis and melena after binge drinking this week.  Abdominal exam with mild tenderness but not alarming.  No current chest pain or acute symptoms.  Will give IV protonix and zofran, NS.  Check labs and reassess.    Reviewed old chart:  Most recent endoscopy in Jan 2014 did not show varices.    Discussed with Dr. Rozell Searing, agrees with admission and protonix gtt. Will plan for endoscopy in am.    Reviewed results with pt.  He requests narcotic pain medications for his legs, back, chest and diffuse pain.  Agrees with admission plans.    Discussed with Dr. Renea Ee, will admit.  Amount and/or Complexity of Data Reviewed:   Clinical lab tests:  Ordered and reviewed  Tests in the radiology section of CPT??:  Ordered and reviewed  Tests in the medicine section of the CPT??:  Ordered and reviewed   Decide to obtain previous medical records or to obtain history from someone other than the patient:  Yes   Obtain history from someone other than the patient:  Yes   Discuss the patient with another provider:  Yes   Independant visualization of image, tracing, or specimen:  Yes  Risk of Significant Complications, Morbidity, and/or Mortality:   Presenting problems:  High  Management options:  High  Progress:   Patient progress:  Stable      Procedures    EKG: Sinus tachycardia with rate of 102 bpm. Normal axis. No significant ST-T wave abnormalities. No  significant changes from prior EKG  on November 08 2012.     Chest X-ray: No acute cardiopulmonary process. No free air under the diaphragm. 10:24 PM     Scribe Attestation Statement:   Loletta Parish 01/19/2013 9:55 PM scribing for and in the presence of Dr.Avyanna Spada Lenord Carbo, MD     Loletta Parish, Scribe      I have reviewed the information recorded by the scribe and agree with its contents. Dr. Matilde Bash, M.D.

## 2013-01-19 NOTE — ED Notes (Signed)
Pt states he is vomiting blood,   And blood in stool.   Pt states he hurts all over

## 2013-01-20 ENCOUNTER — Inpatient Hospital Stay
Admit: 2013-01-20 | Discharge: 2013-01-24 | Disposition: A | Discharge status: Home / Self Care | Payer: Self-pay | Attending: Internal Medicine | Admitting: Internal Medicine

## 2013-01-20 LAB — CBC WITH AUTOMATED DIFF
ABS. BASOPHILS: 0.1 10*3/uL — ABNORMAL HIGH (ref 0.0–0.06)
ABS. BASOPHILS: 0 10*3/uL (ref 0.0–0.1)
ABS. BASOPHILS: 0 10*3/uL (ref 0.0–0.06)
ABS. EOSINOPHILS: 0.1 10*3/uL (ref 0.0–0.4)
ABS. EOSINOPHILS: 0 10*3/uL (ref 0.0–0.4)
ABS. EOSINOPHILS: 0.4 10*3/uL (ref 0.0–0.4)
ABS. LYMPHOCYTES: 0.6 10*3/uL — ABNORMAL LOW (ref 0.8–3.5)
ABS. LYMPHOCYTES: 0.9 10*3/uL (ref 0.9–3.6)
ABS. LYMPHOCYTES: 1.1 10*3/uL (ref 0.8–3.5)
ABS. MONOCYTES: 0.3 10*3/uL (ref 0–1.0)
ABS. MONOCYTES: 0.9 10*3/uL (ref 0.05–1.2)
ABS. MONOCYTES: 1 10*3/uL (ref 0–1.0)
ABS. NEUTROPHILS: 6 10*3/uL (ref 1.8–8.0)
ABS. NEUTROPHILS: 4.5 10*3/uL (ref 1.8–8.0)
ABS. NEUTROPHILS: 7.7 10*3/uL (ref 1.8–8.0)
BASOPHILS: 1 % (ref 0–3)
BASOPHILS: 0 % (ref 0–2)
BASOPHILS: 0 % (ref 0–3)
EOSINOPHILS: 2 % (ref 0–5)
EOSINOPHILS: 1 % (ref 0–5)
EOSINOPHILS: 4 % (ref 0–5)
HCT: 27.4 % — ABNORMAL LOW (ref 36.0–48.0)
HCT: 23.4 % — ABNORMAL LOW (ref 36.0–48.0)
HCT: 25.6 % — ABNORMAL LOW (ref 36.0–48.0)
HGB: 8.6 g/dL — ABNORMAL LOW (ref 13.0–16.0)
HGB: 7.6 g/dL — ABNORMAL LOW (ref 13.0–16.0)
HGB: 8 g/dL — ABNORMAL LOW (ref 13.0–16.0)
LYMPHOCYTES: 9 % — ABNORMAL LOW (ref 20–51)
LYMPHOCYTES: 14 % — ABNORMAL LOW (ref 21–52)
LYMPHOCYTES: 11 % — ABNORMAL LOW (ref 20–51)
MCH: 28.4 PG (ref 24.0–34.0)
MCH: 28.7 PG (ref 24.0–34.0)
MCH: 28.6 PG (ref 24.0–34.0)
MCHC: 31.4 g/dL (ref 31.0–37.0)
MCHC: 32.5 g/dL (ref 31.0–37.0)
MCHC: 31.3 g/dL (ref 31.0–37.0)
MCV: 90.4 FL (ref 74.0–97.0)
MCV: 88.3 FL (ref 74.0–97.0)
MCV: 91.4 FL (ref 74.0–97.0)
MONOCYTES: 4 % (ref 2–9)
MONOCYTES: 14 % — ABNORMAL HIGH (ref 3–10)
MONOCYTES: 10 % — ABNORMAL HIGH (ref 2–9)
MPV: 11.4 FL (ref 9.2–11.8)
MPV: 11.5 FL (ref 9.2–11.8)
MPV: 11 FL (ref 9.2–11.8)
NEUTROPHILS: 84 % — ABNORMAL HIGH (ref 42–75)
NEUTROPHILS: 71 % (ref 40–73)
NEUTROPHILS: 75 % (ref 42–75)
PLATELET COMMENTS: DECREASED
PLATELET COMMENTS: DECREASED
PLATELET: 66 10*3/uL — ABNORMAL LOW (ref 135–420)
PLATELET: 57 10*3/uL — ABNORMAL LOW (ref 135–420)
PLATELET: 89 10*3/uL — ABNORMAL LOW (ref 135–420)
RBC: 3.03 M/uL — ABNORMAL LOW (ref 4.70–5.50)
RBC: 2.65 M/uL — ABNORMAL LOW (ref 4.70–5.50)
RBC: 2.8 M/uL — ABNORMAL LOW (ref 4.70–5.50)
RDW: 20 % — ABNORMAL HIGH (ref 11.6–14.5)
RDW: 19.8 % — ABNORMAL HIGH (ref 11.6–14.5)
RDW: 20.9 % — ABNORMAL HIGH (ref 11.6–14.5)
WBC: 7.1 10*3/uL (ref 4.6–13.2)
WBC: 6.4 10*3/uL (ref 4.6–13.2)
WBC: 10.2 10*3/uL (ref 4.6–13.2)

## 2013-01-20 LAB — METABOLIC PANEL, COMPREHENSIVE
A-G Ratio: 0.8 (ref 0.8–1.7)
ALT (SGPT): 70 U/L — ABNORMAL HIGH (ref 16–61)
AST (SGOT): 93 U/L — ABNORMAL HIGH (ref 15–37)
Albumin: 2.6 g/dL — ABNORMAL LOW (ref 3.4–5.0)
Alk. phosphatase: 85 U/L (ref 45–117)
Anion gap: 5 mmol/L (ref 3.0–18)
BUN/Creatinine ratio: 30 — ABNORMAL HIGH (ref 12–20)
BUN: 24 MG/DL — ABNORMAL HIGH (ref 7.0–18)
Bilirubin, total: 1.2 MG/DL — ABNORMAL HIGH (ref 0.2–1.0)
CO2: 28 mmol/L (ref 21–32)
Calcium: 7.5 MG/DL — ABNORMAL LOW (ref 8.5–10.1)
Chloride: 106 mmol/L (ref 100–108)
Creatinine: 0.8 MG/DL (ref 0.6–1.3)
GFR est AA: >60 mL/min/{1.73_m2} (ref >60)
GFR est non-AA: >60 mL/min/{1.73_m2} (ref >60)
Globulin: 3.4 g/dL (ref 2.0–4.0)
Glucose: 164 mg/dL — ABNORMAL HIGH (ref 74–99)
Potassium: 4.7 mmol/L (ref 3.5–5.5)
Protein, total: 6 g/dL — ABNORMAL LOW (ref 6.4–8.2)
Sodium: 139 mmol/L (ref 136–145)

## 2013-01-20 LAB — URINALYSIS W/ RFLX MICROSCOPIC
Bilirubin: NEGATIVE
Blood: NEGATIVE
Glucose: NEGATIVE mg/dL
Ketone: NEGATIVE mg/dL
Leukocyte Esterase: NEGATIVE
Nitrites: NEGATIVE
Protein: NEGATIVE mg/dL
Specific gravity: 1.015 (ref 1.003–1.030)
Urobilinogen: 0.2 EU/dL (ref 0.2–1.0)
pH (UA): 6 (ref 5.0–8.0)

## 2013-01-20 LAB — FERRITIN: Ferritin: 21 NG/ML (ref 8–388)

## 2013-01-20 LAB — PHOSPHORUS: Phosphorus: 2.9 MG/DL (ref 2.5–4.9)

## 2013-01-20 LAB — SED RATE (ESR): Sed rate, automated: 15 mm/hr (ref 0–20)

## 2013-01-20 LAB — GLUCOSE, POC
Glucose (POC): 146 mg/dL — ABNORMAL HIGH (ref 70–110)
Glucose (POC): 160 mg/dL — ABNORMAL HIGH (ref 70–110)
Glucose (POC): 150 mg/dL — ABNORMAL HIGH (ref 70–110)
Glucose (POC): 120 mg/dL — ABNORMAL HIGH (ref 70–110)

## 2013-01-20 LAB — IRON PROFILE
Iron % saturation: 19 %
Iron: 55 ug/dL (ref 50–175)
TIBC: 290 ug/dL (ref 250–450)

## 2013-01-20 LAB — CKMB PROFILE
CK - MB: 10.2 ng/ml — ABNORMAL HIGH (ref 0.5–3.6)
CK - MB: 11.1 ng/ml — ABNORMAL HIGH (ref 0.5–3.6)
CK-MB Index: 3.7 % (ref 0.0–4.0)
CK-MB Index: 4.7 % — ABNORMAL HIGH (ref 0.0–4.0)
CK: 274 U/L (ref 39–308)
CK: 238 U/L (ref 39–308)

## 2013-01-20 LAB — CARDIAC PANEL,(CK, CKMB & TROPONIN)
CK - MB: 13.3 ng/ml — ABNORMAL HIGH (ref 0.5–3.6)
CK-MB Index: 3.9 % (ref 0.0–4.0)
CK: 345 U/L — ABNORMAL HIGH (ref 39–308)
Troponin-I, QT: <0.02 NG/ML (ref 0.0–0.045)

## 2013-01-20 LAB — HEMOGLOBIN A1C WITH EAG: Hemoglobin A1c: 5.1 % (ref 4.2–6.3)

## 2013-01-20 LAB — PROTHROMBIN TIME + INR
INR: 1.2 (ref 0.8–1.2)
Prothrombin time: 15.5 s — ABNORMAL HIGH (ref 11.5–15.2)

## 2013-01-20 LAB — LIPASE: Lipase: 213 U/L (ref 73–393)

## 2013-01-20 LAB — VITAMIN B12 & FOLATE
Folate: 13.9 ng/mL (ref 5.38–24.0)
Vitamin B12: 520 pg/mL (ref 211–911)

## 2013-01-20 LAB — TSH 3RD GENERATION: TSH: 0.84 u[IU]/mL (ref 0.40–5.00)

## 2013-01-20 LAB — C REACTIVE PROTEIN, QT: C-Reactive protein: <0.3 mg/dL (ref 0–0.3)

## 2013-01-20 LAB — VITAMIN D, 25 HYDROXY: Vitamin D 25-Hydroxy: 10 ng/mL — ABNORMAL LOW (ref 30–100)

## 2013-01-20 LAB — PTT: aPTT: 30.5 s (ref 24.6–37.7)

## 2013-01-20 LAB — ETHYL ALCOHOL: ALCOHOL(ETHYL),SERUM: <3 MG/DL (ref 0–3)

## 2013-01-20 LAB — MAGNESIUM: Magnesium: 1.4 mg/dL — ABNORMAL LOW (ref 1.8–2.4)

## 2013-01-20 MED ADMIN — temazepam (RESTORIL) capsule 7.5 mg: ORAL | @ 09:00:00 | NDC 68084054911

## 2013-01-20 MED ADMIN — magnesium sulfate 6 g in 0.9% sodium chloride IVPB: INTRAVENOUS | @ 10:00:00 | NDC 63323006402

## 2013-01-20 MED ADMIN — docusate sodium (COLACE) capsule 100 mg: ORAL | @ 14:00:00 | NDC 62584068311

## 2013-01-20 MED ADMIN — pantoprazole (PROTONIX) 80mg in NS 100 ml infusion: INTRAVENOUS | @ 10:00:00 | NDC 00338004948

## 2013-01-20 MED ADMIN — HYDROmorphone (DILAUDID) syringe 0.5 mg: INTRAVENOUS | @ 20:00:00 | NDC 00409128305

## 2013-01-20 MED ADMIN — cefTRIAXone (ROCEPHIN) 1 g in 0.9% sodium chloride (MBP/ADV) 50 mL MBP: INTRAVENOUS | @ 14:00:00 | NDC 00409733201

## 2013-01-20 MED ADMIN — lactated ringers infusion: INTRAVENOUS | @ 14:00:00 | NDC 11845118709

## 2013-01-20 MED ADMIN — ondansetron (ZOFRAN) injection 4 mg: INTRAVENOUS | @ 14:00:00 | NDC 24200015844

## 2013-01-20 MED ADMIN — folic acid (FOLVITE) tablet 1 mg: ORAL | @ 14:00:00 | NDC 62584089711

## 2013-01-20 MED ADMIN — thiamine (B-1) tablet 100 mg: ORAL | @ 14:00:00 | NDC 90011015050

## 2013-01-20 MED ADMIN — sodium chloride 0.9 % bolus infusion 1,000 mL: INTRAVENOUS | @ 03:00:00 | NDC 00409798309

## 2013-01-20 MED ADMIN — nadolol (CORGARD) tablet 20 mg: ORAL | @ 14:00:00 | NDC 51079081201

## 2013-01-20 MED ADMIN — octreotide (SANDOSTATIN) 500 mcg in 0.9% sodium chloride 500 mL infusion: INTRAVENOUS | @ 16:00:00 | NDC 55390016210

## 2013-01-20 MED ADMIN — promethazine (PHENERGAN) injection 12.5 mg: INTRAVENOUS | @ 14:00:00 | NDC 60977000143

## 2013-01-20 MED ADMIN — morphine injection 4 mg: INTRAVENOUS | @ 04:00:00 | NDC 00409189101

## 2013-01-20 MED ADMIN — spironolactone (ALDACTONE) tablet 100 mg: ORAL | @ 14:00:00 | NDC 68084020811

## 2013-01-20 MED ADMIN — ondansetron (ZOFRAN) injection 4 mg: INTRAVENOUS | @ 03:00:00 | NDC 00409475503

## 2013-01-20 MED ADMIN — pantoprazole (PROTONIX) granules for oral suspension 40 mg: ORAL | @ 14:00:00 | NDC 00008084401

## 2013-01-20 MED ADMIN — morphine 4 mg/mL injection: @ 04:00:00 | NDC 76045000511

## 2013-01-20 MED ADMIN — pantoprazole (PROTONIX) injection 40 mg: INTRAVENOUS | @ 03:00:00 | NDC 00008092351

## 2013-01-20 MED ADMIN — pantoprazole (PROTONIX) 40 mg in 0.9% sodium chloride (MBP/ADV) 50 mL MBP: INTRAVENOUS | @ 04:00:00 | NDC 00338055311

## 2013-01-20 MED ADMIN — therapeutic multivitamin (THERAGRAN) tablet 1 tablet: ORAL | @ 14:00:00

## 2013-01-20 MED ADMIN — HYDROmorphone (DILAUDID) syringe 0.5 mg: INTRAVENOUS | @ 14:00:00 | NDC 00409128305

## 2013-01-20 MED ADMIN — insulin lispro (HUMALOG) injection: SUBCUTANEOUS | @ 10:00:00 | NDC 00002751017

## 2013-01-20 MED ADMIN — HYDROmorphone (DILAUDID) syringe 0.5 mg: INTRAVENOUS | @ 09:00:00 | NDC 00409128305

## 2013-01-20 NOTE — ED Notes (Signed)
TRANSFER - OUT REPORT:    Verbal report given to [name](name) on [name]  being transferred to [facility]south(unit) for routine progression of care       Report consisted of patient???s Situation, Background, Assessment and   Recommendations(SBAR).     Information from the following report(s) SBAR, ED Summary and MAR was reviewed with the receiving nurse.    Opportunity for questions and clarification was provided.

## 2013-01-20 NOTE — Consults (Addendum)
[facility]   [email or web address]      Impression:   1. Hematemesis, with acute blood loss anemia; r/o varices  2. 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 Ascites: s/p paracentesis in the past. Will need low salt diet on discharge.  4. Abnormal LFT's: due to # 2        Plan:     1. EGD today.      Chief Complaint: Hematemesis      HPI:  [name] is a 62 y.o. male who is being seen on consult for above. 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 He has had this before and last EGD in January 2014 showed hemorrhagic gastritis without any varices.         PMH:   Past Medical History   Diagnosis Date   ??? Hypertension    ??? Ill-defined condition      anemia   ??? Anemia    ??? Varices        PSH:   History reviewed. No pertinent past surgical history.    Social HX:   History     Social History   ??? Marital Status: DIVORCED     Spouse Name: N/A     Number of Children: N/A   ??? Years of Education: N/A     Occupational History   ??? Not on file.     Social History Main Topics   ??? Smoking status: Former Smoker   ??? Smokeless tobacco: Not on file   ??? Alcohol Use: Yes      Comment: occasional   ??? Drug Use: No   ??? Sexually Active: Not on file     Other Topics Concern   ??? Not on file     Social History Narrative   ??? No narrative on file       FHX:   Family History   Problem Relation Age of Onset   ??? Heart Disease Father 42     MI   ??? Heart Disease Brother        Allergy:   No Known Allergies    Home Medications:     Prescriptions prior to admission   Medication Sig   ??? oxycodone-acetaminophen (PERCOCET) 5-325 mg per tablet Take 1 tablet by mouth every four (4) hours as needed for Pain.   ??? nadolol (CORGARD) 20 mg tablet Take 1 tablet by mouth daily.   ??? spironolactone (ALDACTONE) 100 mg tablet Take 1 tablet by mouth daily.   ??? thiamine  (B-1) 100 mg tablet Take 1 tablet by mouth daily.   ??? docusate sodium (COLACE) 100 mg capsule Take 1 capsule by mouth two (2) times a day for 90 days.   ??? folic acid (FOLVITE) 1 mg tablet Take 1 tablet by mouth daily.   ??? multivitamin capsule Take 1 capsule by mouth daily.       Review of Systems:     Constitutional: No fevers, chills, weight loss, fatigue.   Skin: No rashes, pruritis, jaundice, ulcerations, erythema.   HENT: No headaches, nosebleeds, sinus pressure, rhinorrhea, sore throat.   Eyes: No visual changes, blurred vision, eye pain, photophobia, jaundice.   Cardiovascular: No chest pain, heart palpitations.   Respiratory: No cough, SOB, wheezing, chest discomfort, orthopnea.   Gastrointestinal:    Genitourinary: No  dysuria, bleeding, discharge, pyuria.   Musculoskeletal: No weakness, arthralgias, wasting.   Endo: No sweats.   Heme: No bruising, easy bleeding.   Allergies: As noted.   Neurological: Cranial nerves intact.  Alert and oriented. Gait not assessed.   Psychiatric:  No anxiety, depression, hallucinations.                 BP 131/78   Pulse 97   Temp(Src) 98.3 ??F (36.8 ??C)   Resp 16   Ht 5\' 10"  (1.778 m)   Wt 109.77 kg (242 lb)   BMI 34.72 kg/m2   SpO2 99%    Physical Assessment:     constitutional: appearance: well developed, obese. well nourished, normal habitus, no deformities, in no acute distress.   skin: inspection: no rashes, ulcers, icterus or other lesions; no clubbing or telangiectasias. palpation: no induration or subcutaneos nodules.   eyes: inspection: normal conjunctivae and lids; no jaundice pupils: symmetrical, normoreactive to light, normal accommodation and size.   ENMT: mouth: normal oral mucosa,lips and gums; good dentition. oropharynx: normal tongue, hard and soft palate; posterior pharynx without erithema, exudate or lesions.   neck: thyroid: normal size, consistency and position; no masses or tenderness.   respiratory: effort: normal chest excursion; no intercostal retraction  or accessory muscle use.   cardiovascular: abdominal aorta: normal size and position; no bruits. palpation: PMI of normal size and position; normal rhythm; no thrill or murmurs.   abdominal: abdomen: normal consistency; no tenderness or masses. hernias: no hernias appreciated. liver: normal size and consistency. spleen: not palpable.   rectal: hemoccult/guaiac: not performed.   musculoskeletal: digits and nails: no clubbing, cyanosis, petechiae or other inflammatory conditions. gait: normal gait and station head and neck: normal range of motion; no pain, crepitation or contracture. spine/ribs/pelvis: normal range of motion; no pain, deformity or contracture.   lymphatic: axilae: not palpable. groin: not palpable. neck: within normal limits. other: not palpable.   neurologic: cranial nerves: II-XII normal.   psychiatric: judgement/insight: within normal limits. memory: within normal limits for recent and remote events. mood and affect: no evidence of depression, anxiety or agitation. orientation: oriented to time, space and person.        Basic Metabolic Profile   Recent Labs      01/20/13   0200  01/19/13   2150   NA   --   139   K   --   4.7   CL   --   106   CO2   --   28   BUN   --   24*   GLU   --   164*   CA   --   7.5*   MG  1.4*   --    PHOS  2.9   --          CBC w/Diff    Recent Labs      01/20/13   0200   WBC  6.4   RBC  2.65*   HGB  7.6*   HCT  23.4*   MCV  88.3   MCH  28.7   MCHC  32.5   RDW  19.8*   PLT  57*    Recent Labs      01/20/13   0200   GRANS  71   LYMPH  14*   EOS  1        Hepatic Function   Recent Labs      01/19/13   2150  ALB  2.6*   TP  6.0*   TBILI  1.2*   SGOT  93*   AP  85   LPSE  213          Eustace Moore, MD, M.D.   [facility] [place] [place]  [phone]  Direct pager: [phone]  [email or web address]

## 2013-01-20 NOTE — H&P (Signed)
HISTORY AND PHYSICAL    Patient: [name] MRN: [id number]  SSN: [id number]    Date of Birth: 1950/04/24  Age: 62 y.o.  Sex: male      PCP: Not On File    Chief Complaint: Rectal Bleeding, Blood in Vomit and Abdominal Pain    Assessment:  Acute GI Bleed  Acute Blood Loss Anemia  Thrombocytopenia  Cirrhosis of Liver  Alcohol Abuse - last drink 01/19/13  Severe Prot Cal Malnutrition  Hypomag  Vit D Def  Hepatitis, believed to be hepatitis C.   History of esophageal varices.   History of chronic anemia.   Tobacco abuse history.   History of alcohol abuse, which patient states that he essentially does not drink, but does drink on occasion.   History of left lower extremity neuropathy.   History of right rotator cuff disease.  History of SBP  Dic # [id number]

## 2013-01-20 NOTE — Other (Signed)
TRANSFER - OUT REPORT:    Verbal report given to W. Stark RN on [name]  being transferred to 4 SO for routine post - op       Report consisted of patient???s Situation, Background, Assessment and   Recommendations(SBAR).     Information from the following report(s) Procedure Summary was reviewed with the receiving nurse.    Opportunity for questions and clarification was provided.

## 2013-01-20 NOTE — Other (Signed)
TRANSFER - IN REPORT:    Verbal report received from Fowler, RN(name) on [name]  being received from ED(unit) for routine progression of care      Report consisted of patient???s Situation, Background, Assessment and   Recommendations(SBAR).     Information from the following report(s) SBAR and Kardex was reviewed with the receiving nurse.    Opportunity for questions and clarification was provided.      Assessment completed upon patient???s arrival to unit and care assumed.

## 2013-01-20 NOTE — H&P (Signed)
[facility]                               [address] [place], [place] [address]                               HISTORY AND PHYSICAL    PATIENT:      HOVANES, HYMAS  MRN:             [id number]      DATE:   01/19/2013  BILLING:         [id number]   DOB:    1950/04/14  ROOM:         [id number]  REFERRING:    SYSTEMS NC. SOFTMED  DICTATING:    Jennet Maduro, MD        ATTENDING: Jennet Maduro, MD    CHIEF COMPLAINT: "I started vomiting blood again."    HISTORY OF PRESENT ILLNESS: This is a 62 year old white male with a history  significant for hepatitis C, cirrhosis of the liver, alcohol abuse, known  severe protein caloric malnutrition, and known history of esophageal  varices with chronic anemia, who presented to the emergency department  after having an episode of bloody diarrhea and bloody vomitus. The patient  states that he had a relapse in his alcohol abuse, as a result of increased  stress. He states that he began noting blood in his stool just 1 day prior  to current presentation, as well as bloody vomitus. The patient states that  it all began with increased weakness and fatigue which continued to the  point where he felt he needed to present to the emergency department. Upon  evaluation of the patient in the emergency department, the patient was  noted to have an elevated BUN to creatinine ratio at 24 and 0.8,  respectively. The patient was also noted to have an anemia with hemoglobin  of 8.6, and a thrombocytopenia with a platelet count of 66. As such, a call  was placed to Dr. Rozell Searing of Gastroenterology who noted a desire to do an  upper endoscopy, and recommended the patient be n.p.o. with plans for upper  endoscopy on 01/20/2013. Hospital medicine team was subsequently called for  further evaluation and management of the patient.    PAST MEDICAL AND PAST SURGICAL HISTORY  1. Hypertension.  2. Hyperlipidemia.  3. Cirrhosis  of the liver.  4. Hepatitis C.  5. Severe protein caloric malnutrition.  6. History of alcohol abuse with active use.  7. History of left lower extremity neuropathy.  8. History of right rotator cuff disease.  9. History of esophageal varices.  10. History of chronic anemia, likely secondary to myelosuppression and  nutritional deficiency.    ALLERGIES: THE PATIENT HAS NO KNOWN DRUG ALLERGIES.    MEDICATIONS  1. Percocet 5/325 one tablet p.o. every 4 hours p.r.n.  2. Coreg 20 mg p.o. daily.  3. Spironolactone 100 mg p.o. daily.  4. Vitamin B1 100 mg p.o. daily.  5. Docusate sodium 100 mg p.o. b.i.d.  6. Folic acid 1 mg p.o. daily.  7. Multivitamin 1 tablet p.o. daily.    SOCIAL HISTORY  1. Tobacco use history: The patient states that  he quit tobacco  approximately 13 years ago.  2. Alcohol use history: The patient does not quantify the amount of alcohol  that he drinks, but notes that he has had a recent relapse in the last few  days.  3. Illegal drug use history: The patient denies.  4. Living condition: The patient lives alone.    FAMILY HISTORY: Significant for diabetes, atrial fibrillation, CVA, thyroid  dysfunction, coronary artery disease in father at the age of 43, as well as  a brother at the age of 59.    REVIEW OF SYSTEMS: Significant for that which is noted in the HPI. All  other systems are noted as negative. The patient denies any night sweats or  weight changes. The patient denies any bowel or bladder dysfunction. The  patient denies any hemoptysis. The patient denies any hematuria. Does note  hematochezia and some darkened stools. The patient also notes hematemesis.  the patient denies any recent sick contacts or illnesses.    PHYSICAL EXAMINATION  GENERAL: The patient is alert and oriented x3. The patient lying in bed in  no acute distress.  VITAL SIGNS: Temperature of 97.6, pulse of 199, blood pressure 122/80,  respirations 13, oxygen saturation 97% on room air.  HEENT: Head is atraumatic,  normocephalic. Pupils are equal, round, reactive  to light. Extraocular muscles are intact. Mucous membranes are pink and  moist. No oral lesions are noted.  NECK: Soft, supple, with no lymphadenopathy, no carotid bruits. No  thyromegaly. No JVD.  CARDIAC: Regular rate and rhythm with no audible murmurs, rubs, or gallops  noted.  PULMONARY: Clear to auscultation bilaterally.  ABDOMEN: Soft, nontender, nondistended. Normoactive bowel sounds.  EXTREMITIES: The patient has 5/5 muscle strength in upper and lower  extremities, 2/4 deep tendon reflexes in upper and lower extremities. No  edema in lower extremities.  SKIN: No petechia, no ecchymotic lesions, no ulcerations are noted.    ANCILLARY DATA: CBC: White cell count 7.1, hemoglobin 8.6, hematocrit 27.4,  platelet count 66.    Electrolytes: Sodium 139, potassium 4.7, chloride 106, bicarbonate 28, BUN  24, creatinine 0.8, glucose 164.    Calcium noted at 7.5 and albumin noted at 2.6. Lipase noted at 213. INR  noted at 1.2. Cardiac enzymes found to be entirely within normal limits,  with a troponin less than 0.02.    Magnesium noted to be at 1.4.    ASSESSMENT: This is a 62 year old white male with a history significant for  hepatitis C, alcohol abuse, cirrhosis of the liver, who presented to the  emergency department secondary to bloody stools and bloody vomitus, all of  which are consistent with an acute gastrointestinal bleed. With regard to  the patient's active bleeding disease, the patient likely is suffering from  an upper GI bleed as the patient has a known history of esophageal varices.  There is a possibility that the patient has had a ruptured esophageal  varices. I believe it would be prudent to admit the patient for further  evaluation through upper endoscopy as has been planned with Dr. Rozell Searing.    With regard to the patient's active alcohol abuse, I believe the patient  would benefit from being placed on alcohol withdrawal protocol, CIWA  protocol, with  neuro checks.    Thrombocytopenia. The patient's thrombocytopenia is likely secondary to  direct effect from alcohol toxicity, resulting in myelo suppression.    I have had an extensive discussion with the patient about how alcohol is a  toxin to him and what it is doing to his body. I have also had an extensive  discussion with the patient about his liver disease.    PLAN: As discussed in assessment above.                     Jennet Maduro, MD    OES:wmx  D: 01/20/2013  04:20 A  T: 01/20/2013  08:05 A  Job: [id number]  CScriptDoc #: [id number]  cc:   Jennet Maduro, MD

## 2013-01-20 NOTE — Progress Notes (Signed)
Chaplain conducted an initial consultation and Spiritual Assessment for [name], who is a 62 y.o.,male. Patient???s Primary Language is: [place].   According to the patient???s EMR Religious Affiliation is: [place].     The reason the Patient came to the hospital is:   Patient Active Problem List    Diagnosis Date Noted   ??? GI bleed 01/20/2013   ??? Acute upper GI bleeding 01/20/2013   ??? Hematemesis 01/19/2013   ??? Melena 01/19/2013   ??? Thrombocytopenia 01/19/2013   ??? Alcoholic cirrhosis 01/19/2013   ??? Chest pain 01/19/2013   ??? Anemia 11/08/2012   ??? Alcohol abuse 03/11/2012   ??? Thrombocytopenia, unspecified 03/10/2012   ??? Hepatitis C 12/23/2011   ??? Alcoholic cirrhosis of liver 07/26/2011        The Chaplain provided the following Interventions:  Initiated a relationship of care and support.   Explored issues of faith, belief, spirituality and religious/ritual needs while hospitalized.  Listened empathically.  Provided chaplaincy education.  Provided information about Spiritual Care Services.  Offered prayer and assurance of continued prayers on patient's behalf.   Chart reviewed.    The following outcomes were achieved:  Patient shared limited information about both their medical narrative and spiritual journey/beliefs.  Patient processed feeling about current hospitalization.  Patient expressed gratitude for the Chaplain's visit.    Assessment:  Patient does not have any religious/cultural needs that will affect patient???s preferences in health care.        Plan:  Chaplains will continue to follow and will provide pastoral care on an as needed/requested basis.  Chaplain recommends bedside caregivers page chaplain on duty if patient shows signs of acute spiritual or emotional distress.    Chaplain Harlen Labs

## 2013-01-20 NOTE — ED Notes (Signed)
Admitting MD at bedside.

## 2013-01-20 NOTE — Other (Signed)
Bedside and Verbal shift change report given to Lori, RN  (oncoming nurse) by Whitley, RN  (offgoing nurse). Report included the following information SBAR, Kardex and Recent Results.

## 2013-01-20 NOTE — Other (Addendum)
1610: Patient off the floor for EGD.   0845: Patient back to the floor from EGD.

## 2013-01-20 NOTE — Other (Signed)
Primary Nurse Francina Ames, RN and Horatio Pel, RN performed a dual skin assessment on this patient No impairment noted  Braden score is 22    Patient skin is intact, expect for some healed scars on left knee and left upper arm

## 2013-01-20 NOTE — Other (Signed)
Occupational Therapy EVALUATION/discharge    Patient: [name] (62 y.o. male)  Date: 01/20/2013  Primary Diagnosis: GI bleed  Acute upper GI bleeding  GI BLEED  Procedure(s) (LRB):  ENDOSCOPY (N/A) Day of Surgery   Precautions:   Fall    ASSESSMENT AND RECOMMENDATIONS:  Based on the objective data described below, the patient presents with no need for skilled therapy at this time.Pt with no balance or strength deficits.  Skilled occupational therapy is not indicated at this time.    Discharge Recommendations: None  Further Equipment Recommendations for Discharge: N/A       G-CODES:         SUBJECTIVE:   Patient stated ???I need to walk to the bathroom???    OBJECTIVE DATA SUMMARY:     Past Medical History   Diagnosis Date   ??? Hypertension    ??? Ill-defined condition      anemia   ??? Anemia    ??? Varices      Past Surgical History   Procedure Laterality Date   ??? Upper gi endoscopy,ligat varix  01/20/2013           Prior Level of Function/Home Situation:   Home Situation  Home Environment: Apartment  # Steps to Enter: 3  Wheelchair Ramp: No  One/Two Story Residence: Two story, live on 1st floor  # of Interior Steps: 0  Height of Each Step (in): 0 inches  Interior Rails: None  Retail buyer Available: No  Living Alone: No  Support Systems: Friends \\ neighbors  Patient Expects to be Discharged to:: Apartment  Current DME Used/Available at Home: None  [x]      Right hand dominant   []      Left hand dominant  Cognitive/Behavioral Status:  Neurologic State: Alert  Orientation Level: Oriented X4  Cognition: Appropriate decision making;Appropriate for age attention/concentration;Appropriate safety awareness  Safety/Judgement: Awareness of environment    Vision/Perceptual:    Tracking: Able to track stimulus in all quadrants w/o difficulty         Coordination:  Coordination: Within functional limits  Fine Motor Skills-Upper: Left Intact;Right Intact    Gross Motor Skills-Upper: Left Intact;Right Intact  Balance:  Sitting:  Intact;Without support  Standing: Intact;Without support  Strength:  Strength: Within functional limits  Tone & Sensation:  Tone: Normal  Sensation: Intact     Range of Motion:  AROM: Within functional limits  PROM: Within functional limits  Functional Mobility and Transfers for ADLs:  Bed Mobility:  Rolling: Independent  Supine to Sit: Independent  Sit to Supine: Independent  Scooting: Independent  Transfers:  Sit to Stand: Independent     Bed to Chair: Independent          Toilet Transfer : Independent   ADL Assessment:  Feeding: Independent    Oral Facial Hygiene/Grooming: Independent    Bathing: Independent    Upper Body Dressing: Independent    Lower Body Dressing: Independent    Toileting: Independent  Meal Preparation: Independent  ADL Intervention:    Toileting: modified independent    Cognitive Retraining  Safety/Judgement: Awareness of environment    Pain:  Pain Scale 1: Numeric (0 - 10)  Pain Intensity 1: 9  Pain Location 1: Chest     Pain Description 1: Intermittent;Dull;Throbbing;Sharp  Pain Intervention(s) 1: Medication (see MAR)  Activity Tolerance:   Please refer to the flowsheet for vital signs taken during this treatment.  After treatment:   [x]   Patient left in no apparent distress  sitting up in chair  []   Patient left in no apparent distress in bed  []   Call bell left within reach  []   Nursing notified  []   Caregiver present  []   Bed alarm activated    COMMUNICATION/EDUCATION:   Communication/Collaboration:  [x]       Home safety education was provided and the patient/caregiver indicated understanding.  []       Patient/family have participated as able and agree with findings and recommendations.  []       Patient is unable to participate in plan of care at this time.    Eliott Nine, OT

## 2013-01-20 NOTE — Procedures (Addendum)
Esophagogastroduodenoscopy Procedure Note              [name]  [birth year]  [id number]    Indication:  Hematemesis - 578.0     Operator: Eustace Moore, MD    Referring Provider:  Not On File    Sedation:  MAC anesthesia    Procedure Details:  After infomed consent was obtained for the procedure, with all risks and benefits of procedure explained the patient was taken to the endoscopy suite and placed in the left lateral decubitus position.  Following sequential administration of sedation as per above, the endoscope was inserted into the mouth and advanced under direct vision to second portion of the duodenum.  A careful inspection was made as the gastroscope was withdrawn, including a retroflexed view of the proximal stomach; findings and interventions are described below.      Findings:   Esophagus:Small varices seen in the distal esophagus, one column, though small, had some red spots. 2 Bands placed with excellent proximal deflation of the column.  Stomach: Portal hypertensive gastropathy  Duodenum/jejunum: normal    Therapies:  variceal ablation performed with 2 of bands placed    Specimen: none           Complications:   None    EBL:  Minimal.          Recommendations: -IV Octreotide Drip, IV abx 48hours, then po to total 5 days. Consider increasing Nadolol to 40 mg in 24-48 hours. Can DC protonix drip and change to q day. NPO till 6 pm, if no rebleeding, can start clear liquids at 6 PM. Counseled re: alcohol use. Will need repeat EGD in 4 weeks. Consider HCC surveillance with [place].     Eustace Moore, MD  01/20/2013  8:22 AM

## 2013-01-20 NOTE — Other (Signed)
Bedside shift change report given to Whitley, RN (oncoming nurse) by Jamelia Haughton, RN (offgoing nurse). Report included the following information SBAR, Kardex, MAR and Recent Results.

## 2013-01-20 NOTE — Progress Notes (Signed)
Patient seen in f/u. D/w patient

## 2013-01-20 NOTE — Procedures (Signed)
Procedures by  Eustace Moore, MD at 01/20/13 [phone]                Author: Eustace Moore, MD  Service: Gastroenterology  Author Type: Physician       Filed: 01/20/13 0830  Date of Service: 01/20/13 2130  Status: Addendum          Editor: Eustace Moore, MD (Physician)          Related Notes: Original Note by Eustace Moore, MD (Physician) filed at 01/20/13 [phone]            Procedure Orders        1. EGD [[id number]] ordered by Eustace Moore, MD at 01/20/13 [phone]                           Pre-procedure Diagnoses        1. Hemorrhage of gastrointestinal tract, unspecified [578.9]                           Post-procedure Diagnoses        1. 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 Portal hypertensive gastropathy (HCC) [537.89]                           Procedures        1. UPPER GI ENDOSCOPY,LIGAT VARIX [[id number]]                                 Esophagogastroduodenoscopy Procedure Note                         [name]   12/21/1950   [id number]      Indication:  Hematemesis - 578.0       Operator: Eustace Moore, MD      Referring Provider:  Not On File      Sedation:  MAC anesthesia      Procedure Details:   After infomed consent was obtained for the procedure, with all risks and benefits of procedure explained the patient was taken to the endoscopy suite and placed in the left lateral decubitus position.  Following sequential administration of sedation as  per above, the endoscope was inserted into the mouth and advanced under direct vision to second portion of the duodenum.  A careful inspection was made as the gastroscope was withdrawn, including a retroflexed view of the proximal stomach; findings and  interventions are described below.        Findings:    Esophagus:Small varices seen in the distal esophagus, one column, though small, had some red spots. 2 Bands placed with excellent proximal deflation  of the column.   Stomach: Portal hypertensive gastropathy   Duodenum/jejunum: normal       Therapies:  variceal ablation performed with 2 of bands placed      Specimen: none             Complications:   None      EBL:  Minimal.            Recommendations: -IV Octreotide Drip, IV abx 48hours, then po to total 5 days. Consider increasing Nadolol to 40 mg in 24-48 hours. Can DC  protonix drip and change to q day. NPO till 6 pm, if  no rebleeding, can start clear liquids at 6 PM. Counseled re: alcohol use. Will need repeat EGD in 4 weeks. Consider HCC surveillance with [place].       Eustace Moore, MD   01/20/2013  8:22 AM

## 2013-01-21 LAB — CBC WITH AUTOMATED DIFF
ABS. BASOPHILS: 0 10*3/uL (ref 0.0–0.1)
ABS. BASOPHILS: 0 10*3/uL (ref 0.0–0.06)
ABS. EOSINOPHILS: 0.4 10*3/uL (ref 0.0–0.4)
ABS. EOSINOPHILS: 0.4 10*3/uL (ref 0.0–0.4)
ABS. LYMPHOCYTES: 1.1 10*3/uL (ref 0.9–3.6)
ABS. LYMPHOCYTES: 1 10*3/uL (ref 0.9–3.6)
ABS. MONOCYTES: 1 10*3/uL (ref 0.05–1.2)
ABS. MONOCYTES: 1.2 10*3/uL (ref 0.05–1.2)
ABS. NEUTROPHILS: 5 10*3/uL (ref 1.8–8.0)
ABS. NEUTROPHILS: 4.3 10*3/uL (ref 1.8–8.0)
BASOPHILS: 0 % (ref 0–2)
BASOPHILS: 0 % (ref 0–2)
EOSINOPHILS: 5 % (ref 0–5)
EOSINOPHILS: 6 % — ABNORMAL HIGH (ref 0–5)
HCT: 23.4 % — ABNORMAL LOW (ref 36.0–48.0)
HCT: 23.4 % — ABNORMAL LOW (ref 36.0–48.0)
HGB: 7.4 g/dL — ABNORMAL LOW (ref 13.0–16.0)
HGB: 7.3 g/dL — ABNORMAL LOW (ref 13.0–16.0)
LYMPHOCYTES: 15 % — ABNORMAL LOW (ref 21–52)
LYMPHOCYTES: 15 % — ABNORMAL LOW (ref 21–52)
MCH: 28.8 PG (ref 24.0–34.0)
MCH: 28.7 PG (ref 24.0–34.0)
MCHC: 31.6 g/dL (ref 31.0–37.0)
MCHC: 31.2 g/dL (ref 31.0–37.0)
MCV: 91.1 FL (ref 74.0–97.0)
MCV: 92.1 FL (ref 74.0–97.0)
MONOCYTES: 13 % — ABNORMAL HIGH (ref 3–10)
MONOCYTES: 17 % — ABNORMAL HIGH (ref 3–10)
MPV: 10.9 FL (ref 9.2–11.8)
MPV: 10.9 FL (ref 9.2–11.8)
NEUTROPHILS: 67 % (ref 40–73)
NEUTROPHILS: 62 % (ref 40–73)
PLATELET COMMENTS: DECREASED
PLATELET: 70 10*3/uL — ABNORMAL LOW (ref 135–420)
PLATELET: 73 10*3/uL — ABNORMAL LOW (ref 135–420)
RBC: 2.57 M/uL — ABNORMAL LOW (ref 4.70–5.50)
RBC: 2.54 M/uL — ABNORMAL LOW (ref 4.70–5.50)
RDW: 20.8 % — ABNORMAL HIGH (ref 11.6–14.5)
RDW: 21.2 % — ABNORMAL HIGH (ref 11.6–14.5)
WBC: 7.5 10*3/uL (ref 4.6–13.2)
WBC: 7 10*3/uL (ref 4.6–13.2)

## 2013-01-21 LAB — EKG, 12 LEAD, INITIAL
Atrial Rate: 102 {beats}/min
Calculated P Axis: 50 degrees
Calculated R Axis: 26 degrees
Calculated T Axis: 53 degrees
P-R Interval: 130 ms
Q-T Interval: 360 ms
QRS Duration: 80 ms
QTC Calculation (Bezet): 469 ms
Ventricular Rate: 102 {beats}/min

## 2013-01-21 LAB — METABOLIC PANEL, COMPREHENSIVE
A-G Ratio: 0.9 (ref 0.8–1.7)
ALT (SGPT): 74 U/L — ABNORMAL HIGH (ref 16–61)
AST (SGOT): 114 U/L — ABNORMAL HIGH (ref 15–37)
Albumin: 2.7 g/dL — ABNORMAL LOW (ref 3.4–5.0)
Alk. phosphatase: 78 U/L (ref 45–117)
Anion gap: 5 mmol/L (ref 3.0–18)
BUN/Creatinine ratio: 24 — ABNORMAL HIGH (ref 12–20)
BUN: 21 MG/DL — ABNORMAL HIGH (ref 7.0–18)
Bilirubin, total: 1.5 MG/DL — ABNORMAL HIGH (ref 0.2–1.0)
CO2: 28 mmol/L (ref 21–32)
Calcium: 7.3 MG/DL — ABNORMAL LOW (ref 8.5–10.1)
Chloride: 109 mmol/L — ABNORMAL HIGH (ref 100–108)
Creatinine: 0.88 MG/DL (ref 0.6–1.3)
GFR est AA: >60 mL/min/{1.73_m2} (ref >60)
GFR est non-AA: >60 mL/min/{1.73_m2} (ref >60)
Globulin: 3.1 g/dL (ref 2.0–4.0)
Glucose: 90 mg/dL (ref 74–99)
Potassium: 4.5 mmol/L (ref 3.5–5.5)
Protein, total: 5.8 g/dL — ABNORMAL LOW (ref 6.4–8.2)
Sodium: 142 mmol/L (ref 136–145)

## 2013-01-21 LAB — CKMB PROFILE
CK - MB: 11 ng/ml — ABNORMAL HIGH (ref 0.5–3.6)
CK-MB Index: 4.6 % — ABNORMAL HIGH (ref 0.0–4.0)
CK: 240 U/L (ref 39–308)

## 2013-01-21 LAB — GLUCOSE, POC
Glucose (POC): 119 mg/dL — ABNORMAL HIGH (ref 70–110)
Glucose (POC): 113 mg/dL — ABNORMAL HIGH (ref 70–110)
Glucose (POC): 182 mg/dL — ABNORMAL HIGH (ref 70–110)
Glucose (POC): 111 mg/dL — ABNORMAL HIGH (ref 70–110)

## 2013-01-21 LAB — HGB & HCT
HCT: 22 % — ABNORMAL LOW (ref 36.0–48.0)
HGB: 6.9 g/dL — ABNORMAL LOW (ref 13.0–16.0)

## 2013-01-21 LAB — MAGNESIUM: Magnesium: 2 mg/dL (ref 1.8–2.4)

## 2013-01-21 LAB — PROTHROMBIN TIME + INR
INR: 1.4 — ABNORMAL HIGH (ref 0.8–1.2)
Prothrombin time: 16.8 s — ABNORMAL HIGH (ref 11.5–15.2)

## 2013-01-21 MED ADMIN — temazepam (RESTORIL) capsule 7.5 mg: ORAL | @ 02:00:00 | NDC 68084054911

## 2013-01-21 MED ADMIN — folic acid (FOLVITE) tablet 1 mg: ORAL | @ 13:00:00 | NDC 62584089711

## 2013-01-21 MED ADMIN — docusate sodium (COLACE) capsule 100 mg: ORAL | @ 13:00:00 | NDC 62584068311

## 2013-01-21 MED ADMIN — LORazepam (ATIVAN) tablet 0.5 mg: ORAL | @ 21:00:00 | NDC 68084073611

## 2013-01-21 MED ADMIN — HYDROmorphone (DILAUDID) syringe 0.5 mg: INTRAVENOUS | @ 21:00:00 | NDC 00409128305

## 2013-01-21 MED ADMIN — HYDROmorphone (DILAUDID) syringe 0.5 mg: INTRAVENOUS | @ 15:00:00 | NDC 00409128305

## 2013-01-21 MED ADMIN — octreotide (SANDOSTATIN) 500 mcg in 0.9% sodium chloride 500 mL infusion: INTRAVENOUS | NDC 62756034844

## 2013-01-21 MED ADMIN — LORazepam (ATIVAN) tablet 0.5 mg: ORAL | @ 09:00:00 | NDC 68084073611

## 2013-01-21 MED ADMIN — HYDROmorphone (DILAUDID) syringe 0.5 mg: INTRAVENOUS | @ 09:00:00 | NDC 00409128305

## 2013-01-21 MED ADMIN — octreotide (SANDOSTATIN) 500 mcg in 0.9% sodium chloride 500 mL infusion: INTRAVENOUS | @ 04:00:00 | NDC 55390016210

## 2013-01-21 MED ADMIN — cefTRIAXone (ROCEPHIN) 1 g in 0.9% sodium chloride (MBP/ADV) 50 mL MBP: INTRAVENOUS | @ 13:00:00 | NDC 00409733201

## 2013-01-21 MED ADMIN — therapeutic multivitamin (THERAGRAN) tablet 1 tablet: ORAL | @ 13:00:00

## 2013-01-21 MED ADMIN — nadolol (CORGARD) tablet 20 mg: ORAL | @ 13:00:00 | NDC 51079081201

## 2013-01-21 MED ADMIN — LORazepam (ATIVAN) tablet 0.5 mg: ORAL | @ 15:00:00 | NDC 68084073611

## 2013-01-21 MED ADMIN — pantoprazole (PROTONIX) granules for oral suspension 40 mg: ORAL | @ 13:00:00 | NDC 00008084401

## 2013-01-21 MED ADMIN — thiamine (B-1) tablet 100 mg: ORAL | @ 13:00:00 | NDC 90011015050

## 2013-01-21 MED ADMIN — sodium chloride (NS) flush 5-10 mL: INTRAVENOUS | @ 02:00:00 | NDC 87701099893

## 2013-01-21 MED ADMIN — docusate sodium (COLACE) capsule 100 mg: ORAL | @ 23:00:00 | NDC 62584068311

## 2013-01-21 MED ADMIN — spironolactone (ALDACTONE) tablet 100 mg: ORAL | @ 13:00:00 | NDC 68084020811

## 2013-01-21 MED ADMIN — HYDROmorphone (DILAUDID) syringe 0.5 mg: INTRAVENOUS | @ 02:00:00 | NDC 00409128305

## 2013-01-21 NOTE — Progress Notes (Signed)
Pt is a 62 year old male admitted for GI bleed. Pt is alert and oriented alone in room. Pt states residing at home address with a friend Thayer Dallas [phone]). Pt indicates being fully independent with self care, and denies any use and/or access to DMEs prior to admission. 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 Pt states his plan is to return home and to work following discharge. Pt confirms Mrs. Valentina Lucks as his POC. Sw will follow to assist as needed.    Sw contacted [facility] on screening of pt. Sw will follow.

## 2013-01-21 NOTE — Progress Notes (Addendum)
[facility], [place]  [phone]  [email or web address]         Impression/Plan:  1.  ETOH abuse w varices, s/p banding. Hg stable. PPI daily and liquid diet. OK to DC octreotide, cont other PO meds. Low Na diet. Quit ETOH, he understands the need. F/U Dr. Rozell Searing in 4-6 weeks, [phone].      Chief Complaint: Chest pains      Subjective:  No bleeding. No BM.         Eyes: conjunctiva normal, EOM normal   Neck: ROM normal, supple and trachea normal   Cardiovascular: heart normal, intact distal pulses, normal rate and regular rhythm   Pulmonary/Chest Wall: breath sounds normal and effort normal   Abdominal: appearance normal, bowel sounds normal and soft, non-acute, non-tender                BP 106/67   Pulse 83   Temp(Src) 98.2 ??F (36.8 ??C)   Resp 16   Ht 5\' 10"  (1.778 m)   Wt 111.8 kg (246 lb 7.6 oz)   BMI 35.37 kg/m2   SpO2 92%        Intake/Output Summary (Last 24 hours) at 01/21/13 0808  Last data filed at 01/21/13 2956   Gross per 24 hour   Intake      0 ml   Output    500 ml   Net   -500 ml       CBC w/Diff    Lab Results   Component Value Date/Time    WBC 7.0 01/21/2013  3:36 AM    RBC 2.54* 01/21/2013  3:36 AM    HGB 7.3* 01/21/2013  3:36 AM    HCT 23.4* 01/21/2013  3:36 AM    MCV 92.1 01/21/2013  3:36 AM    MCH 28.7 01/21/2013  3:36 AM    MCHC 31.2 01/21/2013  3:36 AM    RDW 21.2* 01/21/2013  3:36 AM    PLT 73* 01/21/2013  3:36 AM    Lab Results   Component Value Date/Time    GRANS 62 01/21/2013  3:36 AM    LYMPH 15* 01/21/2013  3:36 AM    EOS 6* 01/21/2013  3:36 AM    BANDS 1 07/27/2011  4:30 PM    BASOS 0 01/21/2013  3:36 AM    PRO 1* 07/27/2011  4:30 PM      Basic Metabolic Profile   Recent Labs      01/21/13   0336  01/20/13   0200   NA  142   --    K  4.5   --    CL  109*   --    CO2  28   --    BUN  21*   --    CA  7.3*   --    MG  2.0  1.4*   PHOS   --   2.9        Hepatic Function    Lab Results   Component Value Date/Time    ALB 2.7* 01/21/2013  3:36 AM    TP 5.8*  01/21/2013  3:36 AM    AP 78 01/21/2013  3:36 AM    Lab Results   Component Value Date/Time    SGOT 114* 01/21/2013  3:36 AM        @LABRCNT (CULT:3)                 Todd Collum, MD,  MD  [facility], [name]  [email or web address]

## 2013-01-21 NOTE — Other (Signed)
[name] was given referral information as well as the meeting list for Alcoholics Anonymous as requested by Dr. Garret Reddish..

## 2013-01-21 NOTE — Progress Notes (Signed)
[facility]ist [facility]  Progress Note    Patient: [name] Age: 62 y.o. DOB: 10-18-1950 MR#: [id number] SSN: [id number]  Date/Time: 01/21/2013     Subjective:     Patient lying in bed in NAD, awake, alert, follows commands    Assessment/Plan:     1- GI Bleed  2- Esophageal Varices s/p Banding  3- Likely Cirrhosis  4- Acute blood loss anemia  5- Thrombocytopenia  6- Alcohol abuse- no signs of withdrawal    PLAN  S/p EGD, banding, nadolol  Liquid diet, PPI  Counseled lifelong abstinence from alcohol- patient verbalized understanding  Transfuse 1 unit prbcs today  dvt prophylaxis  D/w patient    Case discussed with:  [] Patient  [] Family  [] Nursing  [] Case Management  DVT Prophylaxis:  [] Lovenox  [] Hep SQ  [] SCDs  [] Coumadin   [] On Heparin gtt    Objective:   VS: BP 116/84   Pulse 72   Temp(Src) 98.3 ??F (36.8 ??C)   Resp 19   Ht 5\' 10"  (1.778 m)   Wt 111.8 kg (246 lb 7.6 oz)   BMI 35.37 kg/m2   SpO2 99%   Tmax/24hrs: Temp (24hrs), Avg:98.3 ??F (36.8 ??C), Min:98 ??F (36.7 ??C), Max:98.5 ??F (36.9 ??C)    Intake/Output Summary (Last 24 hours) at 01/21/13 2142  Last data filed at 01/21/13 1813   Gross per 24 hour   Intake   1030 ml   Output    300 ml   Net    730 ml       General:  Awake, alert  Cardiovascular:  S1S2+, RRR  Pulmonary:  CTA b/l  GI:  Soft, BS+, NT, ND  Extremities:  No edema      Labs:    Recent Results (from the past 24 hour(s))   GLUCOSE, POC    Collection Time     01/21/13  1:03 AM       Result Value Range    Glucose (POC) 119 (*) 70 - 110 mg/dL   CBC WITH AUTOMATED DIFF    Collection Time     01/21/13  3:36 AM       Result Value Range    WBC 7.0  4.6 - 13.2 K/uL    RBC 2.54 (*) 4.70 - 5.50 M/uL    HGB 7.3 (*) 13.0 - 16.0 g/dL    HCT 78.2 (*) 95.6 - 48.0 %    MCV 92.1  74.0 - 97.0 FL    MCH 28.7  24.0 - 34.0 PG    MCHC 31.2  31.0 - 37.0 g/dL    RDW 21.3 (*) 08.6 - 14.5 %    PLATELET 73 (*) 135 - 420 K/uL    MPV 10.9  9.2 - 11.8 FL    NEUTROPHILS 62  40 - 73 %     LYMPHOCYTES 15 (*) 21 - 52 %    MONOCYTES 17 (*) 3 - 10 %    EOSINOPHILS 6 (*) 0 - 5 %    BASOPHILS 0  0 - 2 %    ABS. NEUTROPHILS 4.3  1.8 - 8.0 K/UL    ABS. LYMPHOCYTES 1.0  0.9 - 3.6 K/UL    ABS. MONOCYTES 1.2  0.05 - 1.2 K/UL    ABS. EOSINOPHILS 0.4  0.0 - 0.4 K/UL    ABS. BASOPHILS 0.0  0.0 - 0.06 K/UL    DF AUTOMATED     METABOLIC PANEL, COMPREHENSIVE    Collection Time  01/21/13  3:36 AM       Result Value Range    Sodium 142  136 - 145 mmol/L    Potassium 4.5  3.5 - 5.5 mmol/L    Chloride 109 (*) 100 - 108 mmol/L    CO2 28  21 - 32 mmol/L    Anion gap 5  3.0 - 18 mmol/L    Glucose 90  74 - 99 mg/dL    BUN 21 (*) 7.0 - 18 MG/DL    Creatinine 1.61  0.6 - 1.3 MG/DL    BUN/Creatinine ratio 24 (*) 12 - 20      GFR est AA >60  >60 ml/min/1.70m2    GFR est non-AA >60  >60 ml/min/1.4m2    Calcium 7.3 (*) 8.5 - 10.1 MG/DL    Bilirubin, total 1.5 (*) 0.2 - 1.0 MG/DL    ALT 74 (*) 16 - 61 U/L    AST 114 (*) 15 - 37 U/L    Alk. phosphatase 78  45 - 117 U/L    Protein, total 5.8 (*) 6.4 - 8.2 g/dL    Albumin 2.7 (*) 3.4 - 5.0 g/dL    Globulin 3.1  2.0 - 4.0 g/dL    A-G Ratio 0.9  0.8 - 1.7     MAGNESIUM    Collection Time     01/21/13  3:36 AM       Result Value Range    Magnesium 2.0  1.8 - 2.4 mg/dL   PROTHROMBIN TIME + INR    Collection Time     01/21/13  3:36 AM       Result Value Range    Prothrombin time 16.8 (*) 11.5 - 15.2 sec    INR 1.4 (*) 0.8 - 1.2     GLUCOSE, POC    Collection Time     01/21/13  7:39 AM       Result Value Range    Glucose (POC) 113 (*) 70 - 110 mg/dL   GLUCOSE, POC    Collection Time     01/21/13 11:39 AM       Result Value Range    Glucose (POC) 182 (*) 70 - 110 mg/dL   HGB & HCT    Collection Time     01/21/13  1:50 PM       Result Value Range    HGB 6.9 (*) 13.0 - 16.0 g/dL    HCT 09.6 (*) 04.5 - 48.0 %   GLUCOSE, POC    Collection Time     01/21/13  3:37 PM       Result Value Range    Glucose (POC) 111 (*) 70 - 110 mg/dL   GLUCOSE, POC    Collection Time     01/21/13  9:02 PM        Result Value Range    Glucose (POC) 97  70 - 110 mg/dL       Signed By: [name], MD     January 21, 2013

## 2013-01-21 NOTE — Progress Notes (Signed)
Problem: Mobility Impaired (Adult and Pediatric)  Goal: *Acute Goals and Plan of Care (Insert Text)  Outcome: Progressing Towards Goal  PHYSICAL THERAPY EVALUATION & DISCHARGE    Patient: [name] (62 y.o. male)  Date: 01/21/2013  Primary Diagnosis: GI bleed  Acute upper GI bleeding  GI BLEED  Procedure(s) (LRB):  ENDOSCOPY (N/A) 1 Day Post-Op   Precautions:  Fall      ASSESSMENT AND RECOMMENDATIONS:  Based on the objective data described below, the patient presents with function gait but SOB with supine to sit to stand. Pt gait trains with no assistive device with good balance 50 feet.  Skilled physical therapy is not indicated at this time.  Discharge Recommendations: None  Further Equipment Recommendations for Discharge: N/A        G-CODES:           SUBJECTIVE:   Patient stated ???I feel weak.???      OBJECTIVE DATA SUMMARY:       Past Medical History   Diagnosis Date   ??? Hypertension     ??? Ill-defined condition         anemia   ??? Anemia     ??? Varices       Past Surgical History   Procedure Laterality Date   ??? Upper gi endoscopy,ligat varix   01/20/2013             Barriers to Learning/Limitations: no  Compensate with: visual, verbal, tactile, kinesthetic cues/model  Prior Level of Function/Home Situation: independent   Home Situation  Home Environment: Apartment  # Steps to Enter: 3  Wheelchair Ramp: No  One/Two Story Residence: Two story, live on 1st floor  # of Interior Steps: 0  Height of Each Step (in): 0 inches  Interior Rails: None  Retail buyer Available: No  Living Alone: No  Support Systems: Friends \\ neighbors  Patient Expects to be Discharged to:: Apartment  Current DME Used/Available at Home: None  Critical Behavior:  Neurologic State: Alert  Orientation Level: Oriented X4  Cognition: Appropriate decision making;Appropriate for age attention/concentration;Appropriate safety awareness;Follows commands     Psychosocial  Patient Behaviors: Calm;Cooperative  Purposeful Interaction: Yes  Pt Identified  Daily Priority: Clinical issues (comment)  [employer] Process: Nurture loving kindness;Attend basic human needs;Teaching/learning  Caring Interventions: Reassure  Reassure: Therapeutic listening;Caring rounds;Informing  Therapeutic Modalities: Humor;Intentional therapeutic touch   Strength:  4/5   Tone & Sensation: WFL   Range Of Motion:generally decreased at hips due to large abdomen.   Functional Mobility:  Bed Mobility:extra time and effort.   Transfers:WFLextra time.   Balance: good without support     Ambulation/Gait Training:50 feet with no assistive device.   Pain:  Pain Scale 1: Numeric (0 - 10)  Pain Intensity 1: 0   Pain Intervention(s) 1: Medication (see MAR)  Activity Tolerance: fair  Please refer to the flowsheet for vital signs taken during this treatment.  After treatment:   [ ]          Patient left in no apparent distress sitting up in chair  [X]          Patient left in no apparent distress in bed  [X]          Call bell left within reach  [X]          Nursing notified  [ ]          Caregiver present  [ ]          Bed alarm activated  COMMUNICATION/EDUCATION:   [X]          Fall prevention education was provided and the patient indicated understanding.  [X]          Patient has participated as able in goal setting and plan of care.  [ ]          Patient/family agree to work toward stated goals and plan of care.  [ ]          Patient understands intent and goals of therapy, but is neutral about his/her participation.  [ ]          Patient is unable to participate in goal setting and plan of care.    Thank you for this referral.  Gardner Candle, PT   Time Calculation: 20 mins

## 2013-01-21 NOTE — Progress Notes (Signed)
I have consulted crisis to provide patient with information regarding resources available as outpatient to help him quit alcohol use. D/w Aggie Cosier with Crisis

## 2013-01-21 NOTE — Other (Signed)
Bedside and Verbal shift change report given to Shawna RN (oncoming nurse) by Lori George RN (offgoing nurse).  Report given with SBAR, Kardex, Intake/Output, MAR, Accordion and Recent Results.

## 2013-01-21 NOTE — Progress Notes (Signed)
Bedside shift change report given to Lawson Fiscal, Charity fundraiser (oncoming nurse) by Artist Pais, RN (offgoing nurse). Report included the following information SBAR, Kardex, MAR and Recent Results.

## 2013-01-22 LAB — CBC WITH AUTOMATED DIFF
ABS. BASOPHILS: 0 10*3/uL (ref 0.0–0.06)
ABS. EOSINOPHILS: 0.5 10*3/uL — ABNORMAL HIGH (ref 0.0–0.4)
ABS. LYMPHOCYTES: 0.9 10*3/uL (ref 0.8–3.5)
ABS. MONOCYTES: 0.6 10*3/uL (ref 0–1.0)
ABS. NEUTROPHILS: 3.8 10*3/uL (ref 1.8–8.0)
BAND NEUTROPHILS: 1 % (ref 0–5)
BASOPHILS: 0 % (ref 0–3)
EOSINOPHILS: 9 % — ABNORMAL HIGH (ref 0–5)
HCT: 24.1 % — ABNORMAL LOW (ref 36.0–48.0)
HGB: 7.7 g/dL — ABNORMAL LOW (ref 13.0–16.0)
LYMPHOCYTES: 16 % — ABNORMAL LOW (ref 20–51)
MCH: 28.9 PG (ref 24.0–34.0)
MCHC: 32 g/dL (ref 31.0–37.0)
MCV: 90.6 FL (ref 74.0–97.0)
MONOCYTES: 11 % — ABNORMAL HIGH (ref 2–9)
MPV: 11 FL (ref 9.2–11.8)
NEUTROPHILS: 63 % (ref 42–75)
PLATELET COMMENTS: DECREASED
PLATELET: 55 10*3/uL — ABNORMAL LOW (ref 135–420)
RBC: 2.66 M/uL — ABNORMAL LOW (ref 4.70–5.50)
RDW: 19.9 % — ABNORMAL HIGH (ref 11.6–14.5)
WBC: 5.8 10*3/uL (ref 4.6–13.2)

## 2013-01-22 LAB — GLUCOSE, POC
Glucose (POC): 97 mg/dL (ref 70–110)
Glucose (POC): 127 mg/dL — ABNORMAL HIGH (ref 70–110)
Glucose (POC): 140 mg/dL — ABNORMAL HIGH (ref 70–110)
Glucose (POC): 141 mg/dL — ABNORMAL HIGH (ref 70–110)

## 2013-01-22 LAB — METABOLIC PANEL, COMPREHENSIVE
A-G Ratio: 1 (ref 0.8–1.7)
ALT (SGPT): 74 U/L — ABNORMAL HIGH (ref 16–61)
AST (SGOT): 123 U/L — ABNORMAL HIGH (ref 15–37)
Albumin: 2.7 g/dL — ABNORMAL LOW (ref 3.4–5.0)
Alk. phosphatase: 73 U/L (ref 45–117)
Anion gap: 6 mmol/L (ref 3.0–18)
BUN/Creatinine ratio: 21 — ABNORMAL HIGH (ref 12–20)
BUN: 16 MG/DL (ref 7.0–18)
Bilirubin, total: 3.8 MG/DL — ABNORMAL HIGH (ref 0.2–1.0)
CO2: 28 mmol/L (ref 21–32)
Calcium: 6.8 MG/DL — ABNORMAL LOW (ref 8.5–10.1)
Chloride: 104 mmol/L (ref 100–108)
Creatinine: 0.78 MG/DL (ref 0.6–1.3)
GFR est AA: >60 mL/min/{1.73_m2} (ref >60)
GFR est non-AA: >60 mL/min/{1.73_m2} (ref >60)
Globulin: 2.8 g/dL (ref 2.0–4.0)
Glucose: 86 mg/dL (ref 74–99)
Potassium: 3.6 mmol/L (ref 3.5–5.5)
Protein, total: 5.5 g/dL — ABNORMAL LOW (ref 6.4–8.2)
Sodium: 138 mmol/L (ref 136–145)

## 2013-01-22 LAB — HGB & HCT
HCT: 26.6 % — ABNORMAL LOW (ref 36.0–48.0)
HCT: 23 % — ABNORMAL LOW (ref 36.0–48.0)
HGB: 8.5 g/dL — ABNORMAL LOW (ref 13.0–16.0)
HGB: 7.3 g/dL — ABNORMAL LOW (ref 13.0–16.0)

## 2013-01-22 LAB — MAGNESIUM: Magnesium: 1.7 mg/dL — ABNORMAL LOW (ref 1.8–2.4)

## 2013-01-22 MED ADMIN — HYDROmorphone (DILAUDID) syringe 0.5 mg: INTRAVENOUS | @ 09:00:00 | NDC 00409128305

## 2013-01-22 MED ADMIN — folic acid (FOLVITE) tablet 1 mg: ORAL | @ 13:00:00 | NDC 62584089711

## 2013-01-22 MED ADMIN — HYDROmorphone (DILAUDID) syringe 0.5 mg: INTRAVENOUS | @ 15:00:00 | NDC 00409128305

## 2013-01-22 MED ADMIN — thiamine (B-1) tablet 100 mg: ORAL | @ 13:00:00 | NDC 90011015050

## 2013-01-22 MED ADMIN — spironolactone (ALDACTONE) tablet 100 mg: ORAL | @ 13:00:00 | NDC 68084020811

## 2013-01-22 MED ADMIN — LORazepam (ATIVAN) tablet 0.5 mg: ORAL | @ 15:00:00 | NDC 68084073611

## 2013-01-22 MED ADMIN — pantoprazole (PROTONIX) granules for oral suspension 40 mg: ORAL | @ 13:00:00 | NDC 00008084401

## 2013-01-22 MED ADMIN — therapeutic multivitamin (THERAGRAN) tablet 1 tablet: ORAL | @ 13:00:00

## 2013-01-22 MED ADMIN — HYDROmorphone (DILAUDID) syringe 0.5 mg: INTRAVENOUS | @ 03:00:00 | NDC 00409128305

## 2013-01-22 MED ADMIN — LORazepam (ATIVAN) tablet 0.5 mg: ORAL | @ 09:00:00 | NDC 68084073611

## 2013-01-22 MED ADMIN — temazepam (RESTORIL) capsule 7.5 mg: ORAL | @ 06:00:00 | NDC 68084054911

## 2013-01-22 MED ADMIN — HYDROmorphone (DILAUDID) syringe 0.5 mg: INTRAVENOUS | @ 21:00:00 | NDC 00409128305

## 2013-01-22 MED ADMIN — docusate sodium (COLACE) capsule 100 mg: ORAL | @ 13:00:00 | NDC 62584068311

## 2013-01-22 MED ADMIN — nadolol (CORGARD) tablet 20 mg: ORAL | @ 13:00:00 | NDC 51079081201

## 2013-01-22 MED ADMIN — LORazepam (ATIVAN) tablet 0.5 mg: ORAL | @ 21:00:00 | NDC 68084073611

## 2013-01-22 MED ADMIN — cefTRIAXone (ROCEPHIN) 1 g in 0.9% sodium chloride (MBP/ADV) 50 mL MBP: INTRAVENOUS | @ 14:00:00 | NDC 00409733201

## 2013-01-22 MED ADMIN — LORazepam (ATIVAN) tablet 0.5 mg: ORAL | @ 03:00:00 | NDC 68084073611

## 2013-01-22 MED ADMIN — docusate sodium (COLACE) capsule 100 mg: ORAL | @ 23:00:00 | NDC 62584068311

## 2013-01-22 NOTE — Other (Signed)
Bedside and Verbal shift change report given to SHAWNA RN (oncoming nurse) by Lori George RN (offgoing nurse).  Report given with SBAR, Kardex, Intake/Output, MAR, Accordion and Recent Results.

## 2013-01-22 NOTE — Progress Notes (Signed)
Chaplain responded to Doctor Thakral's request to visit and offer support to patient. Chaplain printed out daily AA meetings in [place] and offered them to patient who expressed gratitude. Chaplain offered listening support and contact information. Chaplains will be available for follow up support as needed/requested.    [address] [place], [facility]  [phone]

## 2013-01-22 NOTE — Progress Notes (Signed)
If repeat H&H stable, likely discharge today

## 2013-01-22 NOTE — Other (Signed)
Bedside shift change report given to Lawson Fiscal, rn (Cabin crew) by Delrae Rend (offgoing nurse). Report included the following information SBAR, Kardex and Recent Results.

## 2013-01-22 NOTE — Progress Notes (Addendum)
[facility] Hospitalist Group  Progress Note    Patient: [name] Age: 62 y.o. 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, with cirrhosis and ascites    PLAN  S/p EGD, banding, nadolol  Liquid diet, PPI  Counseled lifelong abstinence from alcohol- patient verbalized understanding  H&H continues to trend lower, Transfuse 1 unit PRBCs today, d/w Dr Thalia Bloodgood GI  dvt prophylaxis  D/w patient    Case discussed with:  [] Patient  [] Family  [] Nursing  [] Case Management  DVT Prophylaxis:  [] Lovenox  [] Hep SQ  [] SCDs  [] Coumadin   [] On Heparin gtt    Objective:   VS: BP 101/66   Pulse 72   Temp(Src) 98 ??F (36.7 ??C)   Resp 16   Ht 5\' 10"  (1.778 m)   Wt 111.9 kg (246 lb 11.1 oz)   BMI 35.4 kg/m2   SpO2 97%   Tmax/24hrs: Temp (24hrs), Avg:98.2 ??F (36.8 ??C), Min:98 ??F (36.7 ??C), Max:98.5 ??F (36.9 ??C)      Intake/Output Summary (Last 24 hours) at 01/22/13 1450  Last data filed at 01/22/13 1230   Gross per 24 hour   Intake   1535 ml   Output    300 ml   Net   1235 ml       General:  Awake, alert  Cardiovascular:  S1S2+, RRR  Pulmonary:  CTA b/l  GI:  Soft, BS+, NT, ND  Extremities:  No edema      Labs:    Recent Results (from the past 24 hour(s))   GLUCOSE, POC    Collection Time     01/21/13  3:37 PM       Result Value Range    Glucose (POC) 111 (*) 70 - 110 mg/dL   GLUCOSE, POC    Collection Time     01/21/13  9:02 PM       Result Value Range    Glucose (POC) 97  70 - 110 mg/dL   HGB & HCT    Collection Time     01/21/13 10:45 PM       Result Value Range    HGB 8.5 (*) 13.0 - 16.0 g/dL    HCT 78.2 (*) 95.6 - 48.0 %   CBC WITH AUTOMATED DIFF    Collection Time      01/22/13  2:00 AM       Result Value Range    WBC 5.8  4.6 - 13.2 K/uL    RBC 2.66 (*) 4.70 - 5.50 M/uL    HGB 7.7 (*) 13.0 - 16.0 g/dL    HCT 21.3 (*) 08.6 - 48.0 %    MCV 90.6  74.0 - 97.0 FL    MCH 28.9  24.0 - 34.0 PG    MCHC 32.0  31.0 - 37.0 g/dL    RDW 57.8 (*) 46.9 - 14.5 %    PLATELET 55 (*) 135 - 420 K/uL    MPV 11.0  9.2 - 11.8 FL    NEUTROPHILS 63  42 - 75 %    BANDS 1  0 -  5 %    LYMPHOCYTES 16 (*) 20 - 51 %    MONOCYTES 11 (*) 2 - 9 %    EOSINOPHILS 9 (*) 0 - 5 %    BASOPHILS 0  0 - 3 %    ABS. NEUTROPHILS 3.8  1.8 - 8.0 K/UL    ABS. LYMPHOCYTES 0.9  0.8 - 3.5 K/UL    ABS. MONOCYTES 0.6  0 - 1.0 K/UL    ABS. EOSINOPHILS 0.5 (*) 0.0 - 0.4 K/UL    ABS. BASOPHILS 0.0  0.0 - 0.06 K/UL    DF AUTOMATED      PLATELET COMMENTS Platelet Estimate, Decreased      RBC COMMENTS ANISOCYTOSIS      RBC COMMENTS 1+      RBC COMMENTS HYPOCHROMIA      RBC COMMENTS 1+     METABOLIC PANEL, COMPREHENSIVE    Collection Time     01/22/13  2:00 AM       Result Value Range    Sodium 138  136 - 145 mmol/L    Potassium 3.6  3.5 - 5.5 mmol/L    Chloride 104  100 - 108 mmol/L    CO2 28  21 - 32 mmol/L    Anion gap 6  3.0 - 18 mmol/L    Glucose 86  74 - 99 mg/dL    BUN 16  7.0 - 18 MG/DL    Creatinine 1.61  0.6 - 1.3 MG/DL    BUN/Creatinine ratio 21 (*) 12 - 20      GFR est AA >60  >60 ml/min/1.31m2    GFR est non-AA >60  >60 ml/min/1.34m2    Calcium 6.8 (*) 8.5 - 10.1 MG/DL    Bilirubin, total 3.8 (*) 0.2 - 1.0 MG/DL    ALT 74 (*) 16 - 61 U/L    AST 123 (*) 15 - 37 U/L    Alk. phosphatase 73  45 - 117 U/L    Protein, total 5.5 (*) 6.4 - 8.2 g/dL    Albumin 2.7 (*) 3.4 - 5.0 g/dL    Globulin 2.8  2.0 - 4.0 g/dL    A-G Ratio 1.0  0.8 - 1.7     MAGNESIUM    Collection Time     01/22/13  2:00 AM       Result Value Range    Magnesium 1.7 (*) 1.8 - 2.4 mg/dL   GLUCOSE, POC    Collection Time     01/22/13  7:30 AM       Result Value Range    Glucose (POC) 127 (*) 70 - 110 mg/dL   GLUCOSE, POC    Collection Time     01/22/13 11:22 AM        Result Value Range    Glucose (POC) 140 (*) 70 - 110 mg/dL   HGB & HCT    Collection Time     01/22/13  2:00 PM       Result Value Range    HGB 7.3 (*) 13.0 - 16.0 g/dL    HCT 09.6 (*) 04.5 - 48.0 %       Signed By: [name], MD     January 22, 2013

## 2013-01-23 LAB — CBC WITH AUTOMATED DIFF
ABS. BASOPHILS: 0 10*3/uL (ref 0.0–0.1)
ABS. EOSINOPHILS: 0.3 10*3/uL (ref 0.0–0.4)
ABS. LYMPHOCYTES: 0.7 10*3/uL — ABNORMAL LOW (ref 0.9–3.6)
ABS. MONOCYTES: 0.7 10*3/uL (ref 0.05–1.2)
ABS. NEUTROPHILS: 2.2 10*3/uL (ref 1.8–8.0)
BASOPHILS: 0 % (ref 0–2)
EOSINOPHILS: 7 % — ABNORMAL HIGH (ref 0–5)
HCT: 25.6 % — ABNORMAL LOW (ref 36.0–48.0)
HGB: 8.3 g/dL — ABNORMAL LOW (ref 13.0–16.0)
LYMPHOCYTES: 18 % — ABNORMAL LOW (ref 21–52)
MCH: 29 PG (ref 24.0–34.0)
MCHC: 32.4 g/dL (ref 31.0–37.0)
MCV: 89.5 FL (ref 74.0–97.0)
MONOCYTES: 18 % — ABNORMAL HIGH (ref 3–10)
MPV: 11.3 FL (ref 9.2–11.8)
NEUTROPHILS: 57 % (ref 40–73)
PLATELET: 51 10*3/uL — ABNORMAL LOW (ref 135–420)
RBC: 2.86 M/uL — ABNORMAL LOW (ref 4.70–5.50)
RDW: 19.1 % — ABNORMAL HIGH (ref 11.6–14.5)
WBC: 3.8 10*3/uL — ABNORMAL LOW (ref 4.6–13.2)

## 2013-01-23 LAB — TYPE & SCREEN
ABO/Rh(D): A POS
Antibody screen: NEGATIVE
Status of unit: TRANSFUSED
Status of unit: TRANSFUSED
Unit division: 0
Unit division: 0

## 2013-01-23 LAB — HGB & HCT
HCT: 25.7 % — ABNORMAL LOW (ref 36.0–48.0)
HGB: 8.2 g/dL — ABNORMAL LOW (ref 13.0–16.0)

## 2013-01-23 LAB — RBC, FOLATE
Folate, Hemolysate: 461.3 ng/mL
Folate, RBC: 1906 ng/mL — ABNORMAL HIGH (ref 499–1504)
HCT: 24.2 % — ABNORMAL LOW (ref 37.5–51.0)

## 2013-01-23 LAB — GLUCOSE, POC
Glucose (POC): 135 mg/dL — ABNORMAL HIGH (ref 70–110)
Glucose (POC): 103 mg/dL (ref 70–110)
Glucose (POC): 202 mg/dL — ABNORMAL HIGH (ref 70–110)

## 2013-01-23 LAB — TYPE AND SCREEN
ABO/Rh: A POS
Antibody Screen: NEGATIVE
Status: TRANSFUSED
Status: TRANSFUSED
Unit Divison: 0
Unit Divison: 0

## 2013-01-23 MED ADMIN — HYDROmorphone (DILAUDID) syringe 0.5 mg: INTRAVENOUS | @ 02:00:00 | NDC 00409128305

## 2013-01-23 MED ADMIN — LORazepam (ATIVAN) tablet 0.5 mg: ORAL | @ 02:00:00 | NDC 68084073611

## 2013-01-23 MED ADMIN — temazepam (RESTORIL) capsule 7.5 mg: ORAL | @ 02:00:00 | NDC 68084054911

## 2013-01-23 MED ADMIN — LORazepam (ATIVAN) tablet 0.5 mg: ORAL | @ 09:00:00 | NDC 68084073611

## 2013-01-23 MED ADMIN — cefTRIAXone (ROCEPHIN) 1 g in 0.9% sodium chloride (MBP/ADV) 50 mL MBP: INTRAVENOUS | @ 16:00:00 | NDC 00409733201

## 2013-01-23 MED ADMIN — HYDROmorphone (DILAUDID) syringe 0.5 mg: INTRAVENOUS | @ 21:00:00 | NDC 00409128305

## 2013-01-23 MED ADMIN — HYDROmorphone (DILAUDID) syringe 0.5 mg: INTRAVENOUS | @ 16:00:00 | NDC 00409128305

## 2013-01-23 MED ADMIN — nadolol (CORGARD) tablet 20 mg: ORAL | @ 16:00:00 | NDC 51079081201

## 2013-01-23 MED ADMIN — insulin lispro (HUMALOG) injection: SUBCUTANEOUS | @ 19:00:00 | NDC 00002751017

## 2013-01-23 MED ADMIN — thiamine (B-1) tablet 100 mg: ORAL | @ 16:00:00 | NDC 90011015050

## 2013-01-23 MED ADMIN — therapeutic multivitamin (THERAGRAN) tablet 1 tablet: ORAL | @ 16:00:00

## 2013-01-23 MED ADMIN — spironolactone (ALDACTONE) tablet 100 mg: ORAL | @ 16:00:00 | NDC 68084020811

## 2013-01-23 MED ADMIN — pantoprazole (PROTONIX) granules for oral suspension 40 mg: ORAL | @ 16:00:00 | NDC 00008084401

## 2013-01-23 MED ADMIN — magnesium sulfate 1 g/100 ml IVPB (premix or compounded): INTRAVENOUS | @ 20:00:00 | NDC 00409672723

## 2013-01-23 MED ADMIN — folic acid (FOLVITE) tablet 1 mg: ORAL | @ 16:00:00 | NDC 62584089711

## 2013-01-23 MED ADMIN — docusate sodium (COLACE) capsule 100 mg: ORAL | @ 16:00:00 | NDC 62584068311

## 2013-01-23 MED ADMIN — HYDROmorphone (DILAUDID) syringe 0.5 mg: INTRAVENOUS | @ 09:00:00 | NDC 00409128305

## 2013-01-23 NOTE — Progress Notes (Addendum)
1610  Received report. Patient A/OX4. Denies pain. No acute distress noted. Call bell within reach. Bed in low position locked.    1400  Patient resting in bed with no acute distress noted call light within reach.    1600  No acute distress noted.    1717   Patient discharged in stable condition. IV removed and gauze placed to site. Discharge instructions and care notes reviewed with and given to patient. Patient able to retain information. Transported to private vehicle by w/c. Denies pain. No acute distress noted.

## 2013-01-23 NOTE — Other (Addendum)
Is there any clinical significance albumin 2.7 total protein 5.5, in pt with etoh abuse, esophageal varicies with banding , cirrhosis with ascities. ( H&P has hx of severe protein calorie malnutrition )       If you DECLINE this query or would like to communicate with CDMP, please utilize the "CDMP message box" at the very bottom of the Progress Note on the right.     [email or web address]     Measurement  Normal       Mild Malnutrition           Moderate Malnutrition     Weight %  90-110                   85-90%                             75-85%    BMI                19-24*               18.18.9                             16-17.9    Albumin  3.5-5.0                       3.1-3.4                            2.4-3.0     Transferin  220-400               201-219                          150-200    Total Lymphocyte Count  2000-3500  1501-1999  [phone]     ASPEN recommends the identification and documentation of 2 of the following 6 characteristics of malnutrition: energy intake, weight loss, body fat, muscle mass, fluid accumulation, grip strength. Further, ASPEN has recommended adjustments in the characteristics based on the context of the malnutrition, e.g., acute illness/injury, chronic illness, social/environmental circumstance.

## 2013-01-23 NOTE — Other (Signed)
Bedside and Verbal shift change report given to Lisa RN (oncoming nurse) by Lori George RN (offgoing nurse).  Report given with SBAR, Kardex, Intake/Output, MAR, Accordion and Recent Results.

## 2013-01-29 NOTE — ED Notes (Signed)
abd pain and bruising near umbilicus started day of discharge from hospital on

## 2013-01-29 NOTE — ED Notes (Signed)
Pt c/o abdominal pain , Dr Earlene Plater made aware

## 2013-01-29 NOTE — ED Notes (Signed)
Xray at bedside

## 2013-01-29 NOTE — ED Notes (Signed)
I have reviewed discharge instructions with the patient and spouse .  The patient and spouse verbalized understanding. Pt ambulated out of Ed in stable condition with no distress noted

## 2013-01-29 NOTE — ED Notes (Signed)
Pt back in ED

## 2013-01-29 NOTE — ED Notes (Signed)
Pt to CT scan in stable condition

## 2013-01-29 NOTE — ED Notes (Signed)
Dr Earlene Plater at bedside speaking to pt about discharge disposition

## 2013-01-29 NOTE — ED Notes (Signed)
Per pt " Im hurting bad, the biggest part is around my stomach around my navel"  States SX last week to abdomen

## 2013-01-29 NOTE — ED Provider Notes (Signed)
HPI Comments: [name] is a 62 y.o. male with a h/o ETOH abuse with varicies, GI bleeding, Cirrhosis, and Hepatits C presents to the ED for abd pain and distention around the umbilicus. He also notes having SOB. Pt was last seen on 01/19/13 and admitted. He had a Upper GI endoscopy on 01/20/13. He reports not drinking EtOH since last discharge. No n/v/d, fever, urinary symptoms, or other complaints noted at this time. Pt is alert and appears uncomfortable.   The history is provided by the patient.        Past Medical History   Diagnosis Date   ??? Hypertension    ??? Ill-defined condition      anemia   ??? Anemia    ??? Varices         Past Surgical History   Procedure Laterality Date   ??? Upper gi endoscopy,ligat varix  01/20/2013               Family History   Problem Relation Age of Onset   ??? Heart Disease Father 43     MI   ??? Heart Disease Brother         History     Social History   ??? Marital Status: DIVORCED     Spouse Name: N/A     Number of Children: N/A   ??? Years of Education: N/A     Occupational History   ??? Not on file.     Social History Main Topics   ??? Smoking status: Former Smoker   ??? Smokeless tobacco: Not on file   ??? Alcohol Use: Yes      Comment: occasional   ??? Drug Use: No   ??? Sexually Active: Not on file     Other Topics Concern   ??? Not on file     Social History Narrative   ??? No narrative on file                  ALLERGIES: Review of patient's allergies indicates no known allergies.      Review of Systems   Constitutional: Negative.  Negative for fever, chills, diaphoresis, appetite change and fatigue.   HENT: Negative.  Negative for facial swelling, neck pain, neck stiffness and ear discharge.    Eyes: Negative.  Negative for photophobia, redness and itching.   Respiratory: Positive for shortness of breath. Negative for cough and chest tightness.    Cardiovascular: Negative.  Negative for chest pain and palpitations.   Gastrointestinal: Positive for abdominal pain. Negative for nausea, vomiting,  diarrhea, constipation and blood in stool.   Endocrine: Negative.    Genitourinary: Negative.  Negative for dysuria, hematuria and difficulty urinating.   Musculoskeletal: Negative.  Negative for back pain.   Skin: Negative.  Negative for rash and wound.   Allergic/Immunologic: Negative.    Neurological: Negative.  Negative for dizziness, seizures, weakness, light-headedness, numbness and headaches.   Hematological: Negative.    Psychiatric/Behavioral: Negative.    All other systems reviewed and are negative.        Filed Vitals:    01/29/13 1827 01/29/13 2052   BP: 155/90 144/84   Pulse: 129 90   Temp: 97.7 ??F (36.5 ??C) 98.1 ??F (36.7 ??C)   Resp: 28 14   Weight: 108.863 kg (240 lb)    SpO2: 99% 99%            Physical Exam   Nursing note and vitals reviewed.  Constitutional: He is  oriented to person, place, and time. He appears well-developed and well-nourished.   HENT:   Head: Normocephalic and atraumatic.   Eyes: Conjunctivae are normal. Pupils are equal, round, and reactive to light.   Neck: Normal range of motion.   Cardiovascular: Normal rate and regular rhythm.    Pulmonary/Chest: Effort normal and breath sounds normal.   Abdominal: Soft. There is tenderness (ubilicus with possible hernia that is soft, reducible, but painful).   Quiet BS   Musculoskeletal: Normal range of motion.   Neurological: He is alert and oriented to person, place, and time.   Skin: Skin is warm and dry. No rash noted.   Psychiatric: His behavior is normal.        MDM     Differential Diagnosis; Clinical Impression; Plan:     [name] is a 62 y.o. Male c/o abd pain localized to the umbilicus. He was recently admitted with complications of cirrhosis secondary to ETOH abuse. No vomiting, diarrhea, or other associated symptoms with the exception of cough/congestion. Routine labs and CT will be ordered. Dispo pending test results and response to treatment.     DDX  1. Acute Abd pain  2. Umbilical hernia  3. SBO  4. Ascites  5.  Nausea          Amount and/or Complexity of Data Reviewed:   Clinical lab tests:  Ordered and reviewed   Discuss the patient with another provider:  Yes  Progress:   Patient progress:  Stable and improved      Procedures    Medications ordered:   Medications   morphine injection 2 mg (not administered)   ioversol (OPTIRAY) 320 mg iodine/mL contrast injection 100 mL (100 mL IntraVENous Given 01/29/13 2116)   morphine injection 4 mg (4 mg IntraVENous Given 01/29/13 2229)   ondansetron (ZOFRAN) injection 4 mg (4 mg IntraVENous Given 01/29/13 2229)         Lab findings:  Labs Reviewed   CBC WITH AUTOMATED DIFF - Abnormal; Notable for the following:     WBC 4.2 (*)     RBC 3.02 (*)     HGB 8.5 (*)     HCT 27.8 (*)     MCHC 30.6 (*)     RDW 18.0 (*)     PLATELET 38 (*)     LYMPHOCYTES 18 (*)     MONOCYTES 22 (*)     EOSINOPHILS 7 (*)     ABS. LYMPHOCYTES 0.8 (*)     All other components within normal limits   METABOLIC PANEL, COMPREHENSIVE - Abnormal; Notable for the following:     Glucose 117 (*)     Calcium 7.6 (*)     Bilirubin, total 1.2 (*)     ALT 88 (*)     AST 129 (*)     Albumin 3.0 (*)     All other components within normal limits   CARDIAC PANEL,(CK, CKMB & TROPONIN) - Abnormal; Notable for the following:     CK 522 (*)     CK - MB 19.3 (*)     All other components within normal limits   PROTHROMBIN TIME + INR   PRO-BNP       EKG interpretation:  12 lead. NSR. 85 bpm. Short PR intervals. No ectopy. No STEMI.     X-Ray, CT or other radiology findings or impressions:  CT ABD PELV W CONT    Final Result: IMPRESSION:  Increasing amount of fat within fat and fluid containing periumbilical hernia.    Decreasing ascites.    New mild distention of multiple small bowel loops without discrete transition    point, likely ileus. Cirrhosis and portal hypertension with splenomegaly.    .       XR CHEST SNGL V    (Results Pending)   CXR: Interpreted by Paulo Fruit, MD , poor inspiration. Mild diffuse increased  markings, no focal infiltrates.     Progress notes, Consult notes or additional Procedure notes:   Consult:  Discussed care with Dr. Nickie Retort, General Surgery. Standard discussion; including history of patient???s chief complaint, available diagnostic results, and treatment course. Agrees to follow patient in office based on CT findings and lab results. Give patient pain medication for management.   11:01 PM      Reevaluation of patient:   I have reevaluated patient. Patient is feeling better. Pt verbalizes understanding of treatment plan and agrees to f/u with Dr. Nickie Retort in office.     Dispo:  Patient was discharged in stable condition.  Patient is to return to emergency department with any new or worsening condition.        Scribe Attestation:   January 29, 2013 at 8:31 PM - Rocky Link scribing for and in the presence of Dr.Romond Pipkins [place], MD     Rocky Link, Scribe      Provider Attestation:   I personally performed the services described in the documentation, reviewed the documentation, as recorded by the scribe in my presence, and it accurately and completely records my words and actions. 11:46 PM 01/29/2013 - Rhona Leavens, MD

## 2013-01-30 LAB — METABOLIC PANEL, COMPREHENSIVE
A-G Ratio: 0.8 (ref 0.8–1.7)
ALT (SGPT): 88 U/L — ABNORMAL HIGH (ref 16–61)
AST (SGOT): 129 U/L — ABNORMAL HIGH (ref 15–37)
Albumin: 3 g/dL — ABNORMAL LOW (ref 3.4–5.0)
Alk. phosphatase: 111 U/L (ref 45–117)
Anion gap: 4 mmol/L (ref 3.0–18)
BUN/Creatinine ratio: 14 (ref 12–20)
BUN: 13 MG/DL (ref 7.0–18)
Bilirubin, total: 1.2 MG/DL — ABNORMAL HIGH (ref 0.2–1.0)
CO2: 29 mmol/L (ref 21–32)
Calcium: 7.6 MG/DL — ABNORMAL LOW (ref 8.5–10.1)
Chloride: 103 mmol/L (ref 100–108)
Creatinine: 0.92 MG/DL (ref 0.6–1.3)
GFR est AA: >60 mL/min/{1.73_m2} (ref >60)
GFR est non-AA: >60 mL/min/{1.73_m2} (ref >60)
Globulin: 3.6 g/dL (ref 2.0–4.0)
Glucose: 117 mg/dL — ABNORMAL HIGH (ref 74–99)
Potassium: 4 mmol/L (ref 3.5–5.5)
Protein, total: 6.6 g/dL (ref 6.4–8.2)
Sodium: 136 mmol/L (ref 136–145)

## 2013-01-30 LAB — CBC WITH AUTOMATED DIFF
ABS. BASOPHILS: 0 10*3/uL (ref 0.0–0.06)
ABS. EOSINOPHILS: 0.3 10*3/uL (ref 0.0–0.4)
ABS. LYMPHOCYTES: 0.8 10*3/uL — ABNORMAL LOW (ref 0.9–3.6)
ABS. MONOCYTES: 0.9 10*3/uL (ref 0.05–1.2)
ABS. NEUTROPHILS: 2.2 10*3/uL (ref 1.8–8.0)
BASOPHILS: 1 % (ref 0–2)
EOSINOPHILS: 7 % — ABNORMAL HIGH (ref 0–5)
HCT: 27.8 % — ABNORMAL LOW (ref 36.0–48.0)
HGB: 8.5 g/dL — ABNORMAL LOW (ref 13.0–16.0)
LYMPHOCYTES: 18 % — ABNORMAL LOW (ref 21–52)
MCH: 28.1 PG (ref 24.0–34.0)
MCHC: 30.6 g/dL — ABNORMAL LOW (ref 31.0–37.0)
MCV: 92.1 FL (ref 74.0–97.0)
MONOCYTES: 22 % — ABNORMAL HIGH (ref 3–10)
MPV: 10.8 FL (ref 9.2–11.8)
NEUTROPHILS: 52 % (ref 40–73)
PLATELET: 38 10*3/uL — ABNORMAL LOW (ref 135–420)
RBC: 3.02 M/uL — ABNORMAL LOW (ref 4.70–5.50)
RDW: 18 % — ABNORMAL HIGH (ref 11.6–14.5)
WBC: 4.2 10*3/uL — ABNORMAL LOW (ref 4.6–13.2)

## 2013-01-30 LAB — PROTHROMBIN TIME + INR
INR: 1.2 (ref 0.8–1.2)
Prothrombin time: 15.1 s (ref 11.5–15.2)

## 2013-01-30 LAB — EKG, 12 LEAD, INITIAL
Atrial Rate: 85 {beats}/min
Calculated P Axis: 24 degrees
Calculated R Axis: 29 degrees
Calculated T Axis: 36 degrees
P-R Interval: 104 ms
Q-T Interval: 366 ms
QRS Duration: 80 ms
QTC Calculation (Bezet): 435 ms
Ventricular Rate: 85 {beats}/min

## 2013-01-30 LAB — CARDIAC PANEL,(CK, CKMB & TROPONIN)
CK - MB: 19.3 ng/ml — ABNORMAL HIGH (ref 0.5–3.6)
CK-MB Index: 3.7 % (ref 0.0–4.0)
CK: 522 U/L — ABNORMAL HIGH (ref 39–308)
Troponin-I, QT: <0.02 NG/ML (ref 0.0–0.045)

## 2013-01-30 LAB — NT-PRO BNP: NT pro-BNP: 38 PG/ML (ref 0–900)

## 2013-01-30 MED ADMIN — morphine injection 4 mg: INTRAVENOUS | @ 03:00:00 | NDC 00409189101

## 2013-01-30 MED ADMIN — ondansetron (ZOFRAN) injection 4 mg: INTRAVENOUS | @ 03:00:00 | NDC 00641607801

## 2013-01-30 MED ADMIN — morphine injection 2 mg: INTRAVENOUS | @ 04:00:00 | NDC 00409189001

## 2013-01-30 MED ADMIN — ioversol (OPTIRAY) 320 mg iodine/mL contrast injection 100 mL: INTRAVENOUS | @ 02:00:00 | NDC 00019132311

## 2013-02-06 LAB — CBC WITH AUTOMATED DIFF
ABS. BASOPHILS: 0 10*3/uL (ref 0.0–0.1)
ABS. EOSINOPHILS: 0.4 10*3/uL (ref 0.0–0.4)
ABS. LYMPHOCYTES: 0.7 10*3/uL — ABNORMAL LOW (ref 0.9–3.6)
ABS. MONOCYTES: 0.8 10*3/uL (ref 0.05–1.2)
ABS. NEUTROPHILS: 1.6 10*3/uL — ABNORMAL LOW (ref 1.8–8.0)
BASOPHILS: 1 % (ref 0–2)
EOSINOPHILS: 11 % — ABNORMAL HIGH (ref 0–5)
HCT: 26.3 % — ABNORMAL LOW (ref 36.0–48.0)
HGB: 8.4 g/dL — ABNORMAL LOW (ref 13.0–16.0)
LYMPHOCYTES: 19 % — ABNORMAL LOW (ref 21–52)
MCH: 28.1 PG (ref 24.0–34.0)
MCHC: 31.9 g/dL (ref 31.0–37.0)
MCV: 88 FL (ref 74.0–97.0)
MONOCYTES: 24 % — ABNORMAL HIGH (ref 3–10)
MPV: 10.7 FL (ref 9.2–11.8)
NEUTROPHILS: 45 % (ref 40–73)
PLATELET: 48 10*3/uL — ABNORMAL LOW (ref 135–420)
RBC: 2.99 M/uL — ABNORMAL LOW (ref 4.70–5.50)
RDW: 16.8 % — ABNORMAL HIGH (ref 11.6–14.5)
WBC: 3.5 10*3/uL — ABNORMAL LOW (ref 4.6–13.2)

## 2013-02-06 LAB — URINALYSIS W/ RFLX MICROSCOPIC
Bilirubin: NEGATIVE
Blood: NEGATIVE
Glucose: NEGATIVE mg/dL
Ketone: NEGATIVE mg/dL
Leukocyte Esterase: NEGATIVE
Nitrites: NEGATIVE
Protein: NEGATIVE mg/dL
Specific gravity: 1.025 (ref 1.003–1.030)
Urobilinogen: 0.2 EU/dL (ref 0.2–1.0)
pH (UA): 6 (ref 5.0–8.0)

## 2013-02-06 LAB — METABOLIC PANEL, COMPREHENSIVE
A-G Ratio: 0.7 — ABNORMAL LOW (ref 0.8–1.7)
ALT (SGPT): 71 U/L — ABNORMAL HIGH (ref 16–61)
AST (SGOT): 111 U/L — ABNORMAL HIGH (ref 15–37)
Albumin: 2.5 g/dL — ABNORMAL LOW (ref 3.4–5.0)
Alk. phosphatase: 88 U/L (ref 45–117)
Anion gap: 4 mmol/L (ref 3.0–18)
BUN/Creatinine ratio: 12 (ref 12–20)
BUN: 9 MG/DL (ref 7.0–18)
Bilirubin, total: 1.1 MG/DL — ABNORMAL HIGH (ref 0.2–1.0)
CO2: 30 mmol/L (ref 21–32)
Calcium: 7.6 MG/DL — ABNORMAL LOW (ref 8.5–10.1)
Chloride: 104 mmol/L (ref 100–108)
Creatinine: 0.75 MG/DL (ref 0.6–1.3)
GFR est AA: >60 mL/min/{1.73_m2} (ref >60)
GFR est non-AA: >60 mL/min/{1.73_m2} (ref >60)
Globulin: 3.4 g/dL (ref 2.0–4.0)
Glucose: 84 mg/dL (ref 74–99)
Potassium: 3.9 mmol/L (ref 3.5–5.5)
Protein, total: 5.9 g/dL — ABNORMAL LOW (ref 6.4–8.2)
Sodium: 138 mmol/L (ref 136–145)

## 2013-02-06 LAB — MAGNESIUM: Magnesium: 1.6 mg/dL — ABNORMAL LOW (ref 1.8–2.4)

## 2013-02-06 LAB — LIPASE: Lipase: 255 U/L (ref 73–393)

## 2013-02-06 LAB — LACTIC ACID: Lactic acid: 1.1 MMOL/L (ref 0.4–2.0)

## 2013-02-06 MED ORDER — HYDROMORPHONE (PF) 2 MG/ML IJ SOLN
2 mg/mL | Freq: Once | INTRAMUSCULAR | Status: AC
Start: 2013-02-06 — End: 2013-02-06
  Administered 2013-02-06: 22:00:00 via INTRAVENOUS

## 2013-02-06 MED ORDER — HYDROMORPHONE (PF) 2 MG/ML IJ SOLN
2 mg/mL | Freq: Once | INTRAMUSCULAR | Status: AC
Start: 2013-02-06 — End: 2013-02-06
  Administered 2013-02-06: 18:00:00 via INTRAVENOUS

## 2013-02-06 MED ORDER — SODIUM CHLORIDE 0.9 % IV PIGGY BACK
3.375 gram | INTRAVENOUS | Status: AC
Start: 2013-02-06 — End: 2013-02-06
  Administered 2013-02-06: 18:00:00 via INTRAVENOUS

## 2013-02-06 MED ORDER — IOVERSOL 320 MG/ML IV SOLN
320 mg iodine/mL | Freq: Once | INTRAVENOUS | Status: AC
Start: 2013-02-06 — End: 2013-02-06
  Administered 2013-02-06: 18:00:00 via INTRAVENOUS

## 2013-02-06 MED ADMIN — sodium chloride 0.9 % bolus infusion 1,000 mL: INTRAVENOUS | @ 18:00:00 | NDC 00409798309

## 2013-02-06 NOTE — ED Notes (Signed)
Hernia/abd pain, shortness of breath

## 2013-02-06 NOTE — Progress Notes (Signed)
Patient came in for narcotics for pain in abdomen.  Patient informed that we do not prescribe any pain medications.  Patient instructed to try and get financial assistance through [employer] to see pain management.  Yvone Neu LPN

## 2013-02-06 NOTE — ED Notes (Signed)
Spoke to [place] regarding Parantesis.She spoke to Dr. Larry Sierras and he has another appointment 4:30 and most likely will not do today.Will notify MD/

## 2013-02-06 NOTE — ED Provider Notes (Signed)
HPI Comments: [name] is a 62 y.o. male with a history of HTN, Hep C and esophageal varices who presents to the ED complaining of periumbilical abdominal pain that has been worsening over the past 5 days. Patient states, "when I came in last time they said something about a blockage, I don't know what I'm talking about good". He states that he was discharged with pain medications that he has run out of at this time. Patient states that he just changed insurance companies and does not have a primary doctor at this time. He denies any alcohol or RDA.  No further symptoms or complaints were expressed.    Reviewed old chart:  Pt was recently discharged (01/23/13) after admission for upper GI bleed with esophageal banding and portal hypertensive gastropathy seen on EGD.  History of cirrhosis and ascites and fat containing umbilical hernia on previous CT.    Patient is a 62 y.o. male presenting with abdominal pain and shortness of breath.   Abdominal Pain     Shortness of Breath  Associated symptoms include abdominal pain.        Past Medical History   Diagnosis Date   ??? Hypertension    ??? Ill-defined condition      anemia   ??? Anemia    ??? Varices         Past Surgical History   Procedure Laterality Date   ??? Upper gi endoscopy,ligat varix  01/20/2013               Family History   Problem Relation Age of Onset   ??? Heart Disease Father 11     MI   ??? Heart Disease Brother         History     Social History   ??? Marital Status: SINGLE     Spouse Name: N/A     Number of Children: N/A   ??? Years of Education: N/A     Occupational History   ??? Not on file.     Social History Main Topics   ??? Smoking status: Former Smoker   ??? Smokeless tobacco: Not on file   ??? Alcohol Use: Yes      Comment: occasional   ??? Drug Use: No   ??? Sexually Active: Not on file     Other Topics Concern   ??? Not on file     Social History Narrative   ??? No narrative on file                  ALLERGIES: Review of patient's allergies indicates no known  allergies.      Review of Systems   Constitutional: Negative.    HENT: Negative.    Eyes: Negative.    Respiratory: Negative.    Cardiovascular: Negative.    Gastrointestinal: Positive for abdominal pain.   Endocrine: Negative.    Genitourinary: Negative.    Musculoskeletal: Negative.    Skin: Negative.    Allergic/Immunologic: Negative.    Neurological: Negative.    Hematological: Negative.    Psychiatric/Behavioral: Negative.    All other systems reviewed and are negative.        Filed Vitals:    02/06/13 1010 02/06/13 1038   BP: 138/93    Pulse: 90    Temp: 98.7 ??F (37.1 ??C)    Resp: 28    Height: 5\' 10"  (1.778 m)    Weight: 120.203 kg (265 lb)    SpO2: 95% 99%  Physical Exam   Vitals reviewed.  Constitutional: He is oriented to person, place, and time. He appears well-developed and well-nourished. No distress.   Appears uncomfortable on stretcher.   HENT:   Head: Normocephalic and atraumatic.   Mouth/Throat: Oropharynx is clear and moist.   MM moist   Eyes: Conjunctivae and EOM are normal. No scleral icterus.   Sclera clear bilaterally   Neck: Neck supple. No JVD present.   Non-tender to palpation   Cardiovascular: Normal rate, regular rhythm and normal heart sounds.  Exam reveals no gallop and no friction rub.    No murmur heard.  Pulmonary/Chest: Effort normal and breath sounds normal. No respiratory distress. He has no wheezes. He has no rales. He exhibits no tenderness.   No crepitance with palpation   Abdominal: Soft. Bowel sounds are normal. He exhibits distension. There is tenderness. There is no rebound and no guarding.   Distended with + fluid wave.  Umbilical hernia with tenderness to palpation, not reducible with pressure.   Genitourinary:   No CVA tenderness   Musculoskeletal: He exhibits no edema and no tenderness.   Normal inspection of upper extremities.  No edema noted to bilateral lower extremities   Lymphadenopathy:     He has no cervical adenopathy.   Neurological: He is alert and  oriented to person, place, and time. No cranial nerve deficit. He exhibits normal muscle tone.   Normal motor and sensation bilaterally to upper and lower extremities   Skin: Skin is warm and dry. No rash noted. He is not diaphoretic.   Psychiatric:   Normal mood and affect.          MDM     Differential Diagnosis; Clinical Impression; Plan:     Pt with history of Hep C, ascites and umbilical hernia, presents with 5 days of worsening periumbilical pain.  Non-reducible hernia on exam, ? Fat vs bowel.  Mild diffuse abdominal tenderness, suspect more related to hernia process than SBP, will check labs.  Will repeat CT and treat pain.  Reassess.    Pt with improved pain. Suspect increase in pain due to pressure on hernia from large amount of ascites and not SBP.  Normal WBC and afebrile. Will ask for paracentesis and send fluid to lab.    Unable to obtain paracentesis today and pt feels better.  Attempted bedside diagnostic paracentesis to be complete (still afebrile with abdominal tightness more than tenderness).  Good acoustic window on [place] but no fluid returned x 2.  Will send for outpatient procedure, life coach to arrange.  Pt in agreement with discharge to home and will return to ED for any worsening pain, fever or other concerns.  Will cover for possible SBP with ofloxacin.  Amount and/or Complexity of Data Reviewed:   Clinical lab tests:  Ordered and reviewed  Tests in the radiology section of CPT??:  Ordered and reviewed   Obtain history from someone other than the patient:  Yes   Independant visualization of image, tracing, or specimen:  Yes  Risk of Significant Complications, Morbidity, and/or Mortality:   Presenting problems:  Moderate  Management options:  Moderate  Progress:   Patient progress:  Improved      Procedures    Scribe Attestation:   February 06, 2013 at 11:42 AM - Su Monks scribing for and in the presence of Dr.Vandell Kun Lenord Carbo, MD     Odie Sera, Scribe      I have reviewed the information  recorded  by the scribe and agree with its contents. Dr. Matilde Bash, M.D.  (PROVIDER ATTESTATION)

## 2013-02-06 NOTE — ED Notes (Signed)
I have reviewed discharge instructions with the patient.  The patient verbalized understanding.

## 2013-02-06 NOTE — ED Notes (Addendum)
PT was referred to [facility] from 01/29/13 ER visit, PT encouraged and discussed importance to follow thru to establish PCP.PT advised that he was sent to [facility] today by [facility] staff due to they would not have appointment until around April 2015, Pt has applied for [employer], will set follow up with [facility] on 02/07/13 due to offices currently closed,  PT acknowledges plan of care   Life Coach Consult conducted to assist PT with to establishing PCP/Follow-up, PT is resident of [place],

## 2013-02-07 NOTE — ED Notes (Addendum)
Life Coach Consult conducted to assist PT with Follow-up, Patient resides in the city of [place],  will follow-up at:     Appointment on Friday February 08, 2013 at 9:00 AM    [facility]  [address]  [place] [address]  [phone]    PT acknowledges appointment and advised he will attend

## 2013-02-08 MED ORDER — HYDROMORPHONE (PF) 2 MG/ML IJ SOLN
2 mg/mL | Freq: Once | INTRAMUSCULAR | Status: AC
Start: 2013-02-08 — End: 2013-02-08
  Administered 2013-02-08: 21:00:00 via INTRAVENOUS

## 2013-02-08 MED ORDER — FUROSEMIDE 10 MG/ML IJ SOLN
10 mg/mL | INTRAMUSCULAR | Status: AC
Start: 2013-02-08 — End: 2013-02-08
  Administered 2013-02-08: 22:00:00 via INTRAVENOUS

## 2013-02-08 NOTE — ED Notes (Signed)
Patient is resting on stretcher in a position of comfort. NAD noted at this time. Patient denies any needs at this time. Family at the bedside. Will continue to monitor.

## 2013-02-08 NOTE — Patient Instructions (Signed)
Eating Healthy Foods: After Your Visit  Your Care Instructions  Eating healthy foods can help lower your risk for disease. Healthy food gives you energy and keeps your heart strong, your brain active, your muscles working, and your bones strong.  A healthy diet includes a variety of foods from the basic food groups: grains, vegetables, fruits, milk and milk products, and meat and beans. Some people may eat more of their favorite foods from only one food group and, as a result, miss getting the nutrients they need. So, it is important to pay attention not only to what you eat but also to what you are missing from your diet. You can eat a healthy, balanced diet by making a few small changes.  Follow-up care is a key part of your treatment and safety. Be sure to make and go to all appointments, and call your doctor if you are having problems. It???s also a good idea to know your test results and keep a list of the medicines you take.  How can you care for yourself at home?  Look at what you eat  ?? Keep a food diary for a week or two and record everything you eat or drink. Track the number of servings you eat from each food group.  ?? For a balanced diet every day, eat a variety of:  ?? 6 or more ounce-equivalents of grains, such as cereals, breads, crackers, rice, or pasta, every day. An ounce-equivalent is 1 slice of bread, 1 cup of ready-to-eat cereal, or ?? cup of cooked rice, cooked pasta, or cooked cereal.  ?? 2?? cups of vegetables, especially:  ?? Dark-green vegetables such as broccoli and spinach.  ?? Orange vegetables such as carrots and sweet potatoes.  ?? Dry beans (such as pinto and kidney beans) and peas (such as lentils).  ?? 2 cups of fresh, frozen, or canned fruit. A small apple or 1 banana or orange equals 1 cup.  ?? 3 cups of nonfat or low-fat milk, yogurt, or other milk products.  ?? 5?? ounces of meat and beans, such as chicken, fish, lean meat, beans, nuts, and seeds. One egg, 1 tablespoon of peanut butter, ??  ounce nuts or seeds, or ?? cup of cooked beans equals 1 ounce of meat.  ?? Learn how to read food labels for serving sizes and ingredients. Fast-food and convenience-food meals often contain few or no fruits or vegetables. Make sure you eat some fruits and vegetables to make the meal more nutritious.  ?? Look at your food diary. For each food group, add up what you have eaten and then divide the total by the number of days. This will give you an idea of how much you are eating from each food group. See if you can find some ways to change your diet to make it more healthy.  Start small  ?? Do not try to make dramatic changes to your diet all at once. You might feel that you are missing out on your favorite foods and then be more likely to fail.  ?? Start slowly, and gradually change your habits. Try some of the following:  ?? Use whole wheat bread instead of white bread.  ?? Use nonfat or low-fat milk instead of whole milk.  ?? Eat brown rice instead of white rice, and eat whole wheat pasta instead of white-flour pasta.  ?? Try low-fat cheeses and low-fat yogurt.  ?? Add more fruits and vegetables to meals and have them for snacks.  ??   Add lettuce, tomato, cucumber, and onion to sandwiches.  ?? Add fruit to yogurt and cereal.  Enjoy food  ?? You can still eat your favorite foods. You just may need to eat less of them. If your favorite foods are high in fat, salt, and sugar, limit how often you eat them, but do not cut them out entirely.  ?? Eat a wide variety of foods.  Make healthy choices when eating out  ?? The type of restaurant you choose can help you make healthy choices. Even fast-food chains are now offering more low-fat or healthier choices on the menu.  ?? Choose smaller portions, or take half of your meal home.  ?? When eating out, try:  ?? A veggie pizza with a whole wheat crust or grilled chicken (instead of sausage or pepperoni).  ?? Pasta with roasted vegetables, grilled chicken, or marinara sauce instead of cream sauce.   ?? A vegetable wrap or grilled chicken wrap.  ?? Broiled or poached food instead of fried or breaded items.  Make healthy choices easy  ?? Buy packaged, prewashed, ready-to-eat fresh vegetables and fruits, such as baby carrots, salad mixes, and chopped or shredded broccoli and cauliflower.  ?? Buy packaged, presliced fruits, such as melon or pineapple.  ?? Choose 100% fruit or vegetable juice instead of soda. Limit juice intake to 4 to 6 oz (?? to ?? cup) a day.  ?? Blend low-fat yogurt, fruit juice, and canned or frozen fruit to make a smoothie for breakfast or a snack.   Where can you learn more?   Go to http://www.healthwise.net/[facility]  Enter T756 in the search box to learn more about "Eating Healthy Foods: After Your Visit."   ?? 2006-2014 [employer]. Care instructions adapted under license by [employer] (which disclaims liability or warranty for this information). This care instruction is for use with your licensed healthcare professional. If you have questions about a medical condition or this instruction, always ask your healthcare professional. Healthwise, Incorporated disclaims any warranty or liability for your use of this information.  Content Version: 10.2.346038; Current as of: Jul 08, 2011

## 2013-02-08 NOTE — ED Notes (Signed)
Pt. States  He had abdominal  Pain/swelling  For  3 weeks,  Was seen  Here 2 days ago,   Return   For increase  Abdominal  Pain  & swelling

## 2013-02-08 NOTE — ED Notes (Signed)
I have reviewed discharge instructions with the patient.  The patient verbalized understanding.  Patient is ambulatory at this time with NAD and VSS. Patient is being discharged with 3 prescriptions at this time.   Patient armband removed and shredded

## 2013-02-08 NOTE — ED Notes (Signed)
Patient is resting on stretcher in a position of comfort. NAD noted at this time. Family at the bedside. Patient denies any needs at this time. Will continue to monitor.

## 2013-02-08 NOTE — ED Provider Notes (Signed)
HPI Comments: [name] is a 62 y.o. male with Hx hepatitis C, ascites and varices who presents to the ED C/O persistent abdominal pain. Pt was seen in the ED 2 days ago for abdominal pain and ascites. He saw his new PCP today and was given Rx for spironolactone.  He came to the ED because he states he was unable to schedule GI follow up and was told to come here to be seen.  US guided paracentesis was ordered but pt states the radiologist was not able to do a paracentesis. "They said the fluid was too close to my liver".  Pt states that his abdominal pain has worsened. He denies any fevers or chills, no nausea or vomiting, no diarrhea or melena/hematochezia.  Pt reports that Percocet was providing no relief so he stopped taking it last night. Pt denies any other Sx or complaints.      The history is provided by the patient.        Past Medical History   Diagnosis Date   ??? Hypertension    ??? Ill-defined condition      anemia   ??? Anemia    ??? Varices    ??? Hepatitis C virus         Past Surgical History   Procedure Laterality Date   ??? Upper gi endoscopy,ligat varix  01/20/2013               Family History   Problem Relation Age of Onset   ??? Heart Disease Father 58     MI   ??? Heart Disease Brother         History     Social History   ??? Marital Status: SINGLE     Spouse Name: N/A     Number of Children: N/A   ??? Years of Education: N/A     Occupational History   ??? Not on file.     Social History Main Topics   ??? Smoking status: Former Smoker   ??? Smokeless tobacco: Not on file   ??? Alcohol Use: Yes      Comment: occasional   ??? Drug Use: No   ??? Sexually Active: Not on file     Other Topics Concern   ??? Not on file     Social History Narrative   ??? No narrative on file                  ALLERGIES: Review of patient's allergies indicates no known allergies.      Review of Systems   Constitutional: Negative for fever and activity change.   HENT: Negative.    Eyes: Negative for discharge.   Respiratory: Negative for  shortness of breath.    Cardiovascular: Negative for chest pain and palpitations.   Gastrointestinal: Positive for abdominal pain and abdominal distention.   Genitourinary: Negative for testicular pain.   Musculoskeletal: Negative for back pain.   Neurological: Negative for weakness.   Hematological: Negative for adenopathy.   Psychiatric/Behavioral: Negative for confusion.   All other systems reviewed and are negative.        Filed Vitals:    02/08/13 1121   BP: 153/90   Pulse: 82   Temp: 98.3 ??F (36.8 ??C)   Resp: 18   Height: 5\' 10"  (1.778 m)   Weight: 120.657 kg (266 lb)   SpO2: 99%            Physical Exam   Vitals reviewed.  Constitutional: He is oriented to person, place, and time. He appears well-developed and well-nourished. No distress.   Resting comfortably on stretcher in supine position.   HENT:   Head: Normocephalic and atraumatic.   MM moist   Eyes: Conjunctivae and EOM are normal. No scleral icterus.   Sclera clear bilaterally   Neck: Normal range of motion. Neck supple. No JVD present.   Non-tender to palpation   Cardiovascular: Normal rate, regular rhythm and normal heart sounds.  Exam reveals no gallop and no friction rub.    No murmur heard.  Pulmonary/Chest: Effort normal and breath sounds normal. No respiratory distress. He has no wheezes. He has no rales. He exhibits no tenderness.   No crepitance with palpation   Abdominal: Soft. Bowel sounds are normal. He exhibits distension. There is no rebound and no guarding.   Unchanged abdominal exam from 2 days ago, still distended with + fluid wave, umbilical hernia that has not changed.  Still with diffuse tenderness to palpation.   Genitourinary:   No CVA tenderness   Musculoskeletal: He exhibits no edema and no tenderness.   Normal inspection of upper extremities.  No edema noted to bilateral lower extremities   Lymphadenopathy:     He has no cervical adenopathy.   Neurological: He is alert and oriented to person, place, and time. No cranial nerve  deficit. He exhibits normal muscle tone.   Normal motor and sensation bilaterally to upper and lower extremities   Skin: Skin is warm and dry. No rash noted. He is not diaphoretic.   Psychiatric:   Normal mood and affect.          MDM     Differential Diagnosis; Clinical Impression; Plan:     Pt with fat containing hernia on CT 2 days ago with increased ascites and persistent/worsened pain.  Afebrile today with reportedly minimal fluid.  On ofloxacin for possible SBP but if that little volume of fluid was identified, lower suspicion for SBP.  Discussed with GI and general surgery.  Will add lasix and give dose in ED.  GI and surgery follow up as well as PMD follow up for repeat electrolytes in 1 week. Will discharge to home.  Amount and/or Complexity of Data Reviewed:   Clinical lab tests:  Ordered and reviewed  Tests in the radiology section of CPT??:  Reviewed   Decide to obtain previous medical records or to obtain history from someone other than the patient:  Yes   Obtain history from someone other than the patient:  Yes   Discuss the patient with another provider:  Yes  Risk of Significant Complications, Morbidity, and/or Mortality:   Presenting problems:  Moderate  Management options:  Moderate  Progress:   Patient progress:  Stable    Consult:  Discussed care with Dr. Hart Rochester Standard discussion; including history of patient???s chief complaint, available diagnostic results, and treatment course. He agrees with medications and recommends general surgeon consultation.   2:35 PM, 02/08/2013     Consult:  Discussed care with Dr. Nickie Retort Standard discussion; including history of patient???s chief complaint, available diagnostic results, and treatment course. He recommends medical management to get the ascites under control then doing the hernia repair at a later date. He states there are no acute surgical indications 2:45 PM, 02/08/2013     Procedures    Scribe Attestation:   February 08, 2013 at 2:28 PM - Janet Berlin and Coralyn Helling scribing for and in the presence of Dr.Anadelia Kintz  Lenord Carbo, MD     Janet Berlin, Scribe  Coralyn Helling, Scribe    I have reviewed the information recorded by the scribe and agree with its contents. Dr. Matilde Bash, M.D.  (PROVIDER ATTESTATION)

## 2013-02-08 NOTE — ED Notes (Signed)
Several un-acknowledge  orders were noted to the patients chart. The patient was unable to complete the orders due to issues during his paracentesis. The provider is aware. No further orders given. Orders to be discontinued per provider.

## 2013-02-08 NOTE — ED Notes (Addendum)
PT returned to ER after PCP visit, PT was to follow PCP referral to GI, but PT advised PCP that he would be out of town, in which PCP referral wasn't sent, PT was prev. Advised of plan of care to Establish PCP first and then would be referred to GI, PT has applied for [employer], Spoke with Dr. Fayrene Fearing and discussed Plan to ref. PT to GI. Awaiting referral, referral provided by Dr. Llana Aliment    [facility]  [address]  [place] [address]  [phone]    Appointment Time on: Thursday March 14, 2012 at 8:20 AM    Pt encouraged to contact GI office to inq. About cancelled appointment that he may take to be seen earlier, PT acknowledges understanding

## 2013-02-08 NOTE — Progress Notes (Signed)
Chief Complaint   Patient presents with   ??? Bloated     pt here with c/o fluid retention to abd.     Pt states he was sent here to have fluid removed from his abd.

## 2013-02-08 NOTE — Progress Notes (Signed)
Chronic Illness Visit    Today's Date:  02/08/2013   Patient's Name: [name]   Patient's DOB:  [id number]     History:     Chief Complaint   Patient presents with   ??? Bloated     pt here with c/o fluid retention to abd.       [name] is a 62 y.o. male presenting for  Establishment of Care.  Patient was referred here from Fluid Retention and expecting to have fluid drained. He has no income and is also expecting to get assistance with meds. He was referred here by the Life Coach.       Acute complaint today: Bloating and abdominal swelling    Patient has a history of Hep C, Liver Cirrhosis, and ascites  Onset: patient says symptoms have been worsening for the past week  Symptoms: enlarging stomach and bloating  Alleviating/Aggravating Factors: denies  Patient was offered diuretics but says that he cannot afford any meds  Patient does not currently see GI he is persistent that he have a parcentesis done to remove fluid. 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 He has not been drinking recently and reports he thinks this is helping the swelling.       Past Medical History   Diagnosis Date   ??? Hypertension    ??? Ill-defined condition      anemia   ??? Anemia    ??? Varices    ??? Hepatitis C virus      Past Surgical History   Procedure Laterality Date   ??? Upper gi endoscopy,ligat varix  01/20/2013           History     Social History   ??? Marital Status: SINGLE     Spouse Name: N/A     Number of Children: N/A   ??? Years of Education: N/A     Social History Main Topics   ??? Smoking status: Former Smoker   ??? Smokeless tobacco: Not on file   ??? Alcohol Use: Yes      Comment: occasional   ??? Drug Use: No   ??? Sexually Active: Not on file     Other Topics Concern   ??? Not on file     Social History Narrative   ??? No narrative on file     Family History   Problem Relation Age of Onset   ??? Heart Disease Father 50     MI   ??? Heart Disease Brother      No Known Allergies    Problem List:       Patient Active Problem List   Diagnosis Code   ??? 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 Hepatitis C 070.70   ??? Thrombocytopenia, unspecified 287.5   ??? Alcohol abuse 305.00   ??? Anemia 285.9   ??? Hematemesis 578.0   ??? Melena 578.1   ??? Thrombocytopenia 287.5   ??? Alcoholic cirrhosis 571.2   ??? Chest pain 786.50   ??? GI bleed 578.9   ??? Acute upper GI bleeding 578.9       Medications:     Current Outpatient Prescriptions   Medication Sig   ??? oxyCODONE-acetaminophen (PERCOCET) 5-325 mg per tablet Take 1-2 tablets by mouth every four (4) hours as needed for Pain.   ??? nadolol (CORGARD) 20 mg tablet Take 1 tablet by mouth daily.   ??? oxyCODONE-acetaminophen (PERCOCET) 5-325 mg  per tablet Take 1 tablet by mouth every eight (8) hours as needed for Pain.   ??? thiamine (B-1) 100 mg tablet Take 1 tablet by mouth daily.   ??? OTHER Check CBC, BMP in 3 days, results to PCP   ??? oxyCODONE IR (ROXICODONE) 10 mg tab immediate release tablet Take 1 tablet by mouth every four (4) hours as needed for Pain.   ??? furosemide (LASIX) 40 mg tablet Take 1 tablet by mouth daily for 20 days.   ??? ondansetron hcl (ZOFRAN, AS HYDROCHLORIDE,) 4 mg tablet Take 1 tablet by mouth every eight (8) hours as needed for Nausea.   ??? ofloxacin (FLOXIN) 400 mg tablet Take 1 tablet by mouth two (2) times a day for 10 days.   ??? ondansetron hcl (ZOFRAN, AS HYDROCHLORIDE,) 4 mg tablet Take 1 tablet by mouth every eight (8) hours as needed for Nausea for up to 12 doses.   ??? docusate sodium (COLACE) 100 mg capsule Take 1 capsule by mouth two (2) times a day for 90 days.   ??? folic acid (FOLVITE) 1 mg tablet Take 1 tablet by mouth daily.   ??? multivitamin capsule Take 1 capsule by mouth daily.   ??? spironolactone (ALDACTONE) 100 mg tablet Take 1 tablet by mouth daily.   ??? pantoprazole (PROTONIX) 40 mg tablet Take 1 tablet by mouth daily.   ??? ferrous sulfate 325 mg (65 mg iron) tablet Take 1 tablet by mouth Daily (before breakfast).     No current facility-administered  medications for this visit.     Facility-Administered Medications Ordered in Other Visits   Medication Dose Route Frequency   ??? [COMPLETED] HYDROmorphone (PF) (DILAUDID) injection 1 mg  1 mg IntraVENous ONCE   ??? [COMPLETED] furosemide (LASIX) injection 40 mg  40 mg IntraVENous NOW         Constitutional: negative for fevers, chills, sweats and fatigue  Respiratory: negative for cough, sputum or wheezing  Cardiovascular: negative for chest pain, chest pressure/discomfort, dyspnea  Gastrointestinal: positive for ascites, negative for nausea, vomiting, diarrhea, constipation and abdominal pain  Musculoskeletal:negative for myalgias and arthralgias  Neurological: negative for headaches and dizziness  Behavioral/Psych: negative for anxiety and depression    Physical Assessment:   VS:  BP 127/73   Pulse 86   Temp(Src) 97.8 ??F (36.6 ??C) (Oral)   Resp 20   Ht 5\' 10"  (1.778 m)   Wt 269 lb (122.018 kg)   BMI 38.6 kg/m2   SpO2 94%    Lab Results   Component Value Date/Time    Sodium 138 02/06/2013 12:10 PM    Potassium 3.9 02/06/2013 12:10 PM    Chloride 104 02/06/2013 12:10 PM    CO2 30 02/06/2013 12:10 PM    Anion gap 4 02/06/2013 12:10 PM    Glucose 84 02/06/2013 12:10 PM    BUN 9 02/06/2013 12:10 PM    Creatinine 0.75 02/06/2013 12:10 PM    BUN/Creatinine ratio 12 02/06/2013 12:10 PM    GFR est non-AA >60 02/06/2013 12:10 PM    Calcium 7.6 02/06/2013 12:10 PM    GFR est AA >60 02/06/2013 12:10 PM      Abdominal Ultrasound 02/08/13    Ultrasound of upper and lower quadrants of the abdomen was obtained.   Small amount of perihepatic ascites. Trace ascites in the left upper and the   lower quadrants.   Impression: Small amount of ascites, not amenable to paracentesis.     General:   Well-groomed, obese,  in no distress, pleasant, alert, appropriate and conversant.   Mouth:  Good dentition, oropharynx WNL without membranes, exudates, petechiae or ulcers  Neck:   Neck supple, no swelling, mass or tenderness  Cardiovasc:   RRR,  no MRG.  Pulses 2+ and symmetric at distal extremities.  Pulmonary:   Lungs clear bilaterally.  Normal respiratory effort.  Abdomen:   Abdomen soft, NT, ND, NAB. Distended abdomen no fluid wave visible. Enlarged liver.   Extremities:   No edema, no TTP bilateral calves.  LEs warm and well-perfused.  Neuro:   Alert and oriented, no focal deficits. No facial asymmetry noted.  Skin:    No rashes or jaundice  Psych:  No pressured speech or abnormal thought content        Assessment/Plan & Orders:       1. Hepatitis C, without hepatic coma    2. Alcoholic cirrhosis of liver    3. Ascites        Orders Placed This Encounter   ??? REFERRAL TO GASTROENTEROLOGY       Ascites  -will recommend spironolactone 100 mg daily (patient declined meds at this time and says he can't afford anything) and refer to GI. Discussed with patient in length that this is a [facility] and we do not do procedures such as paracentesis.    -recommended fluid restriction and abstaining from alcohol    Follow up as needed    Westley Gambles, MD  Family Medicine  02/08/2013  10:15 AM

## 2013-02-12 LAB — CULTURE, BLOOD
Culture result:: NO GROWTH
Culture result:: NO GROWTH

## 2013-02-25 LAB — CARDIAC PANEL,(CK, CKMB & TROPONIN)
CK - MB: 9.9 ng/ml — ABNORMAL HIGH (ref 0.5–3.6)
CK-MB Index: 3.9 % (ref 0.0–4.0)
CK: 253 U/L (ref 39–308)
Troponin-I, QT: <0.02 NG/ML (ref 0.0–0.045)

## 2013-02-25 LAB — CBC WITH AUTOMATED DIFF
ABS. BASOPHILS: 0 10*3/uL (ref 0.0–0.1)
ABS. EOSINOPHILS: 0.7 10*3/uL — ABNORMAL HIGH (ref 0.0–0.4)
ABS. LYMPHOCYTES: 0.7 10*3/uL — ABNORMAL LOW (ref 0.9–3.6)
ABS. MONOCYTES: 1 10*3/uL (ref 0.05–1.2)
ABS. NEUTROPHILS: 2.4 10*3/uL (ref 1.8–8.0)
BASOPHILS: 1 % (ref 0–2)
EOSINOPHILS: 15 % — ABNORMAL HIGH (ref 0–5)
HCT: 31.3 % — ABNORMAL LOW (ref 36.0–48.0)
HGB: 9.9 g/dL — ABNORMAL LOW (ref 13.0–16.0)
LYMPHOCYTES: 15 % — ABNORMAL LOW (ref 21–52)
MCH: 26.8 PG (ref 24.0–34.0)
MCHC: 31.6 g/dL (ref 31.0–37.0)
MCV: 84.6 FL (ref 74.0–97.0)
MONOCYTES: 21 % — ABNORMAL HIGH (ref 3–10)
MPV: 11.6 FL (ref 9.2–11.8)
NEUTROPHILS: 48 % (ref 40–73)
PLATELET COMMENTS: DECREASED
PLATELET: 59 10*3/uL — ABNORMAL LOW (ref 135–420)
RBC: 3.7 M/uL — ABNORMAL LOW (ref 4.70–5.50)
RDW: 15.8 % — ABNORMAL HIGH (ref 11.6–14.5)
WBC: 4.8 10*3/uL (ref 4.6–13.2)

## 2013-02-25 LAB — METABOLIC PANEL, COMPREHENSIVE
A-G Ratio: 0.8 (ref 0.8–1.7)
ALT (SGPT): 60 U/L (ref 16–61)
AST (SGOT): 76 U/L — ABNORMAL HIGH (ref 15–37)
Albumin: 3.1 g/dL — ABNORMAL LOW (ref 3.4–5.0)
Alk. phosphatase: 110 U/L (ref 45–117)
Anion gap: 6 mmol/L (ref 3.0–18)
BUN/Creatinine ratio: 12 (ref 12–20)
BUN: 13 MG/DL (ref 7.0–18)
Bilirubin, total: 1.2 MG/DL — ABNORMAL HIGH (ref 0.2–1.0)
CO2: 29 mmol/L (ref 21–32)
Calcium: 7.6 MG/DL — ABNORMAL LOW (ref 8.5–10.1)
Chloride: 105 mmol/L (ref 100–108)
Creatinine: 1.08 MG/DL (ref 0.6–1.3)
GFR est AA: >60 mL/min/{1.73_m2} (ref >60)
GFR est non-AA: >60 mL/min/{1.73_m2} (ref >60)
Globulin: 3.8 g/dL (ref 2.0–4.0)
Glucose: 93 mg/dL (ref 74–99)
Potassium: 3.8 mmol/L (ref 3.5–5.5)
Protein, total: 6.9 g/dL (ref 6.4–8.2)
Sodium: 140 mmol/L (ref 136–145)

## 2013-02-25 LAB — LIPASE: Lipase: 310 U/L (ref 73–393)

## 2013-02-25 MED ORDER — IOVERSOL 320 MG/ML IV SOLN
320 mg iodine/mL | Freq: Once | INTRAVENOUS | Status: AC
Start: 2013-02-25 — End: 2013-02-25
  Administered 2013-02-25: 23:00:00 via INTRAVENOUS

## 2013-02-25 MED ORDER — MORPHINE 4 MG/ML SYRINGE
4 mg/mL | INTRAMUSCULAR | Status: AC
Start: 2013-02-25 — End: 2013-02-25
  Administered 2013-02-25: 20:00:00 via INTRAVENOUS

## 2013-02-25 MED ORDER — MORPHINE 4 MG/ML SYRINGE
4 mg/mL | INTRAMUSCULAR | Status: AC
Start: 2013-02-25 — End: 2013-02-25
  Administered 2013-02-25: 23:00:00 via INTRAVENOUS

## 2013-02-25 MED ORDER — ONDANSETRON (PF) 4 MG/2 ML INJECTION
4 mg/2 mL | INTRAMUSCULAR | Status: AC
Start: 2013-02-25 — End: 2013-02-25
  Administered 2013-02-25: 20:00:00 via INTRAVENOUS

## 2013-02-25 MED ADMIN — sodium chloride 0.9 % bolus infusion 1,000 mL: INTRAVENOUS | @ 20:00:00 | NDC 00409798309

## 2013-02-25 NOTE — ED Notes (Signed)
Report diarrhea x 5 days

## 2013-02-25 NOTE — ED Notes (Signed)
Pt reports abd pain x since 02/15/13.  abd pain increased

## 2013-02-25 NOTE — ED Provider Notes (Signed)
HPI Comments: Annette Liotta. Tuccillo is a 63 year old male with a PMHx HTN, Anemia, Varices, Hepatitis C, GI Bleed, Alcohol Abuse, and Hernia presents to ER c/o decreased appetite, nausea, vomiting, diarrhea, and diffuse abdominal pain.  Onset of symptoms started 10 days ago.  Patient reports within the past 24 hours had 4 episodes of vomiting yellow gastric fluid and greater than 10 episodes of watery brown/yellow diarrhea.  Describes abdominal pain has sharp discomfort.  States, "feels like someone is sticking a sharp knife in me."  Patient reports he stopped drinking alcohol three days ago.  He reports has been able to keep a little food and fluids down today.  Denies fever, chills, fatigue, headache, dizziness, CP, SOB, melena, rectal pain.     Patient is a 63 y.o. male presenting with abdominal pain, nausea, and vomiting. The history is provided by the patient.   Abdominal Pain   Associated symptoms include diarrhea, nausea and vomiting. Pertinent negatives include no fever, no constipation, no headaches and no chest pain.   Nausea   Associated symptoms include abdominal pain and diarrhea. Pertinent negatives include no chills, no fever, no headaches, no cough and no headaches.   Vomiting   Associated symptoms include abdominal pain and diarrhea. Pertinent negatives include no chills, no fever, no headaches, no cough and no headaches.        Past Medical History   Diagnosis Date   ??? Hypertension    ??? Ill-defined condition      anemia   ??? Anemia    ??? Varices    ??? Hepatitis C virus    ??? GI bleed    ??? Alcohol abuse    ??? Hernia         Past Surgical History   Procedure Laterality Date   ??? Upper gi endoscopy,ligat varix  01/20/2013               Family History   Problem Relation Age of Onset   ??? Heart Disease Father 61     MI   ??? Heart Disease Brother         History     Social History   ??? Marital Status: SINGLE     Spouse Name: N/A     Number of Children: N/A   ??? Years of Education: N/A     Occupational History   ???  Not on file.     Social History Main Topics   ??? Smoking status: Former Smoker   ??? Smokeless tobacco: Not on file   ??? Alcohol Use: Yes      Comment: occasional   ??? Drug Use: No   ??? Sexually Active: Not on file     Other Topics Concern   ??? Not on file     Social History Narrative   ??? No narrative on file                  ALLERGIES: Review of patient's allergies indicates no known allergies.      Review of Systems   Constitutional: Positive for appetite change. Negative for fever, chills, diaphoresis, activity change and fatigue.   HENT: Negative.    Eyes: Negative.    Respiratory: Negative.  Negative for cough and shortness of breath.    Cardiovascular: Negative.  Negative for chest pain.   Gastrointestinal: Positive for nausea, vomiting, abdominal pain and diarrhea. Negative for constipation, blood in stool, abdominal distention, anal bleeding and rectal pain.   Genitourinary:  Negative.    Musculoskeletal: Negative.    Skin: Negative.    Neurological: Negative.  Negative for dizziness, tremors, syncope, weakness, light-headedness and headaches.   All other systems reviewed and are negative.        Filed Vitals:    02/25/13 1358   BP: 116/78   Pulse: 73   Temp: 96.5 ??F (35.8 ??C)   Resp: 20   Height: 5\' 10"  (1.778 m)   Weight: 108.863 kg (240 lb)   SpO2: 98%            Physical Exam   Nursing note and vitals reviewed.  Constitutional: He is oriented to person, place, and time. He appears well-developed. No distress.   HENT:   Right Ear: Hearing, tympanic membrane, external ear and ear canal normal.   Left Ear: Hearing, tympanic membrane, external ear and ear canal normal.   Nose: Nose normal.   Mouth/Throat: Uvula is midline. Mucous membranes are pale and dry. No oropharyngeal exudate, posterior oropharyngeal edema, posterior oropharyngeal erythema or tonsillar abscesses.   Cardiovascular: Normal rate, regular rhythm and normal heart sounds.  Exam reveals no gallop and no friction rub.    No murmur heard.   Pulmonary/Chest: Effort normal and breath sounds normal. No respiratory distress. He has no wheezes. He has no rales. He exhibits no tenderness.   Abdominal: Soft. Bowel sounds are normal. He exhibits no shifting dullness, no distension, no fluid wave and no mass. There is tenderness in the epigastric area, periumbilical area and left lower quadrant. There is guarding. There is no rigidity, no rebound, no CVA tenderness, no tenderness at McBurney's point and negative Murphy's sign. A hernia is present.       Musculoskeletal: Normal range of motion.   Neurological: He is alert and oriented to person, place, and time.   Skin: Skin is warm and dry. He is not diaphoretic.        MDM     Differential Diagnosis; Clinical Impression; Plan:     I performed a brief evaluation, including history and physical, of the patient here in triage and I have determined that pt will need further treatment and evaluation from the main side ER physician.  I have placed initial orders to help in expediting patients care.         Pt with ongoing abdominal pain secondary to umbillical hernia, with worsening sx over the last 4 days including n/v/d. Labs and CT ordered to r/o SBO. 3:05 PM    Labs Reviewed  CBC WITH AUTOMATED DIFF - Abnormal; Notable for the following:      RBC                           3.70 (*)               HGB                           9.9 (*)                HCT                           31.3 (*)               RDW  15.8 (*)               PLATELET                      59 (*)                 LYMPHOCYTES                   15 (*)                 MONOCYTES                     21 (*)                 EOSINOPHILS                   15 (*)                 ABS. LYMPHOCYTES              0.7 (*)                ABS. EOSINOPHILS              0.7 (*)             All other components within normal limits  METABOLIC PANEL, COMPREHENSIVE - Abnormal; Notable for the following:      Calcium                       7.6  (*)                Bilirubin, total              1.2 (*)                AST                           76 (*)                 Albumin                       3.1 (*)             All other components within normal limits  CARDIAC PANEL,(CK, CKMB & TROPONIN) - Abnormal; Notable for the following:      CK - MB                       9.9 (*)             All other components within normal limits  LIPASE    CT: Interval decrease in volume of ascites. Slightly nodular thickening of the peritoneal surfaces may be due to chronic inflammation. Cannot totally exclude acute peritoneal infection or mesenteric tumor. Clinical correlation please followup may be of benefit. Cirrhosis. Diverticulosis without evidence of diverticulitis. Small umbilical hernia contains fat with slight strands of increased density nonspecific. Likely due to ascites.    Patient is improved, resting quietly and comfortably. Labs unremarkable. CT results negative for SBO. Is followed by [facility] as outpatient for further follow up. Will d/h with meds for nausea and pain. Reviewed all workup results, including any lab or radiology results, meds given and/or prescribed, and discharge instructions with patient and any family present. Patient and any  family present advised to return here immediately at any time for recurrent symptoms, new symptoms, any concerns, or if unable to obtain follow-up as directed. Patient expresses understanding and agrees to proceed with discharge plan. 6:38 PM                EKG    Date/Time: 02/25/2013 4:33 PM  Performed by: Leslee HomeALVES, Detrich Rakestraw S  Authorized by: Leslee HomeALVES, Amazing Cowman S  Interpreted by ED physician  Comparison: compared with previous ECG from 01/30/2013  Similar to previous ECG  Rhythm: sinus rhythm  Rate: normal  BPM: 69  QRS axis: normal  Conduction: conduction normal  ST Segments: ST segments normal  T Waves: T waves normal  Other: no other findings  Clinical impression: normal ECG

## 2013-02-25 NOTE — ED Provider Notes (Signed)
I was personally available for consultation in the emergency department. I have reviewed the chart prior to the patient's discharge and agree with the documentation recorded by the MLP, including the assessment, treatment plan, and disposition.

## 2013-02-25 NOTE — ED Notes (Signed)
Pt reports abd pain x 3 days

## 2013-02-25 NOTE — ED Notes (Signed)
Pt resting in bed. Resps easy and un-labored. NAD. Skin PWD. Pt family at bedside.

## 2013-02-25 NOTE — ED Notes (Signed)
Pt given discharge instructions and prescriptions. Pt instructed to not drink, drive or take tylenol with norco. Pt instructed to follow up with primary care doctor or return to ED for worse s/sx. Pt alert and awake. Resps easy and un-labored. NAD. Skin PWD. Electronic signature pad not available at this time. Verbal consent and understanding of discharge instructions. Pt ambulated to ED lobby with a steady gait.

## 2013-02-25 NOTE — ED Notes (Signed)
Life Coach Consult conducted to assist PT with Follow-up seeking to setup New PCP, [place]     [facility]   [address]   [place] [id number]   [phone]   Appointment Time on: Thursday March 14, 2012 at 8:20 AM

## 2013-02-26 LAB — EKG, 12 LEAD, INITIAL
Atrial Rate: 69 {beats}/min
Calculated P Axis: 29 degrees
Calculated R Axis: 29 degrees
Calculated T Axis: 53 degrees
P-R Interval: 110 ms
Q-T Interval: 420 ms
QRS Duration: 82 ms
QTC Calculation (Bezet): 450 ms
Ventricular Rate: 69 {beats}/min

## 2013-03-05 NOTE — Discharge Summary (Signed)
[id number]

## 2013-03-05 NOTE — Discharge Summary (Signed)
[facility]                               [address]                          [place], [place] [id number]                                 DISCHARGE SUMMARY    PATIENT:   Todd PettyWILLIAMS, Todd CURTIS  MRN:           [id number]    ADMIT DATE:    01/20/2013  BILLING:       [id number] [facility] DATE:     01/23/2013  ATTENDING: Jennet Maduro. EDWARD SENU-OKE, MD  DICTATING  Jonah BlueVIKAS Neela Zecca, MD      DISCHARGE DIAGNOSES  At the time of discharge include  1. Acute gastrointestinal bleed.  2. Esophageal varices status post banding.  3. Likely cirrhosis.  4. Acute blood loss anemia.  5. Thrombocytopenia.  6. Alcohol abuse.  7. Moderate protein calorie malnutrition with an albumin of 2.7.    DISCHARGE MEDICATIONS  For the patient included  1. Corgard 20 mg p.o. daily.  2. Colace 100 mg p.o. twice daily.  3. Folic acid 1 mg p.o. daily.  4. Multivitamin 1 capsule p.o. daily.  5. Percocet 5/325 one tablet p.o. every 8 hours p.r.n.  6. Aldactone 100 mg p.o. daily.  7. Thiamine 100 mg p.o. daily.  8. Protonix 40 mg p.o. daily.  9. Levofloxacin 500 mg p.o. daily for 4 more days.  10. Ferrous sulfate 325 mg p.o. daily before breakfast.    DISCHARGE INSTRUCTIONS  1. Diet is cardiac.  2. Activity as tolerated.  3. Fall precautions.  4. Aspiration precautions.  5. Incentive spirometry every 30 minutes when awake.  6. Check a CBC, CMP, magnesium in 3 days.  7. Followup with PCP in 1 week.  8. Followup with Gastroenterology in 3 weeks.    Discharge plan was discussed with the patient and he verbalized  understanding and agreed. I counseled the patient regarding quitting  alcohol use. The patient verbalized understanding and stated he would try.    HOSPITAL COURSE: This is a 63 year old male who presented to the ER with  complaints of vomiting blood. The patient was admitted, was kept n.p.o.,  was started on IV fluids. The patient was started on a proton pump  inhibitor. Gastroenterology was consulted. Hemoglobin and  hematocrit were  monitored. The patient received packed RBC transfusions. Gastroenterology  saw the patient. The patient had an upper endoscopy done which showed  esophageal varices status post banding. The patient was started on a diet,  which he was able to tolerate. PPI was continued. The patient was counseled  regarding quitting alcohol use. Crisis was consulted to provide the patient  with information regarding resources available as an outpatient to help  with quitting alcohol use. Hemoglobin and hematocrit remained stable and  the patient was subsequently discharged to home.    Outpatient PCP was requested for the patient and outpatient followup with  Gastroenterology was recommended in 4 weeks.    TIME SPENT: Total time for the discharge was more than 35 minutes.                     Jonah BlueVIKAS Tametria Aho, MD  VT:wmx  D: 03/05/2013 T: 03/05/2013 10:17 P  Job: [id number]  CScriptDoc #: [id number]  cc:   Jennet Maduro, MD        [name], MD

## 2013-03-08 NOTE — Patient Instructions (Signed)
Cirrhosis: After Your Visit  Your Care Instructions     Cirrhosis occurs when healthy tissue in your liver gets scarred. This keeps the liver from working well. It usually happens after a liver has been inflamed for years.  Cirrhosis is most often caused by alcohol abuse or hepatitis infection. But there are other causes too. These include medicines and too much fat in the liver. Conditions passed down in families and other disorders can also cause it. In some cases, no cause can be found.  Treatment can't completely fix liver damage. But you may be able to slow or prevent more damage if you don't drink alcohol or use drugs that harm your liver.  Follow-up care is a key part of your treatment and safety. Be sure to make and go to all appointments, and call your doctor if you are having problems. It's also a good idea to know your test results and keep a list of the medicines you take.  How can you care for yourself at home?  ?? Do not drink any alcohol. It can harm your liver. Talk to your doctor if you need help to stop drinking.  ?? Be safe with medicines. Take your medicines exactly as prescribed. Call your doctor if you think you are having a problem with your medicine.  ?? Talk to your doctor before you take any other medicines. These include over-the-counter medicines and herbal products.  ?? Be careful taking acetaminophen (Tylenol), ibuprofen (Advil, Motrin), or naproxen (Aleve). These can sometimes cause more liver damage. Talk with your doctor if you're not sure which medicines are safe.  ?? If your cirrhosis causes extra fluid to build up in your body, try not to eat a lot of salt. Use less salt when you cook and at the table. Don't eat fast foods or snack foods with a lot of salt. Extra fluid in your belly, legs, and chest can cause serious problems.  ?? Work with your doctor or a dietitian to be sure you eat the right amount of carbohydrate, protein, fat, and sodium (salt). It's very important to choose the  best foods for the health of your liver.  ?? If your doctor recommends it, limit how much fluid you drink.  ?? If your doctor recommends it, get more exercise. Walking is a good choice. Bit by bit, increase the amount you walk every day. Try for at least 30 minutes on most days of the week. You also may want to swim, bike, or do other activities.  When should you call for help?  Call 911 anytime you think you may need emergency care. For example, call if:  ?? You passed out (lost consciousness).  Call your doctor now or seek immediate medical care if:  ?? You feel very sleepy or confused.  ?? You have new belly pain, or your pain gets worse.  ?? You have a fever.  ?? There is a new or increasing yellow tint to your skin or the whites of your eyes.  ?? You have any abnormal bleeding, such as:  ?? Nosebleeds.  ?? Vaginal bleeding that is different (heavier, more frequent, at a different time of the month) than what you are used to.  ?? Bloody or black stools, or rectal bleeding.  ?? Bloody or pink urine.  Watch closely for changes in your health, and be sure to contact your doctor if you have any problems.   Where can you learn more?   Go to http://www.healthwise.net/[facility]    Enter M412 in the search box to learn more about "Cirrhosis: After Your Visit."   ?? 2006-2014 Healthwise, Incorporated. Care instructions adapted under license by [facility] (which disclaims liability or warranty for this information). This care instruction is for use with your licensed healthcare professional. If you have questions about a medical condition or this instruction, always ask your healthcare professional. Healthwise, Incorporated disclaims any warranty or liability for your use of this information.  Content Version: 10.2.346038; Current as of: October 26, 2011

## 2013-03-08 NOTE — Progress Notes (Signed)
Chronic Illness Visit    Today's Date:  03/11/2013   Patient's Name: [name]   Patient's DOB:  1950-07-19     History:     Chief Complaint   Patient presents with   ??? Hospital Follow Up     pt states that tp is here for follow up for pt abdominal pain pt states that pt is is pain pt states that Percocet doen,t work for pt but Roxicoden does help pt states "that pt abdominal pain feels like someone stuffed extra guts in pt abdomen"       [name] is a 63 y.o. male presenting for a follow up of [facility] Illness [facility] Hospital follow up. Patient discharged from 02/25/13. Patient reports he went to the ED today and the life coach told him to come to his PCP for Pain Meds.     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 He does not even know the name of the physician or group he is suppose to see.      Past Medical History   Diagnosis Date   ??? Hypertension    ??? Ill-defined condition      anemia   ??? Anemia    ??? Varices    ??? Hepatitis C virus    ??? GI bleed    ??? Alcohol abuse    ??? Hernia      Past Surgical History   Procedure Laterality Date   ??? Upper gi endoscopy,ligat varix  01/20/2013           History     Social History   ??? Marital Status: SINGLE     Spouse Name: N/A     Number of Children: N/A   ??? Years of Education: N/A     Social History Main Topics   ??? Smoking status: Former Smoker   ??? Smokeless tobacco: Not on file   ??? Alcohol Use: Yes      Comment: occasional   ??? Drug Use: No   ??? Sexually Active: Not on file     Other Topics Concern   ??? Not on file     Social History Narrative   ??? No narrative on file     Family History   Problem Relation Age of Onset   ??? Heart Disease Father 48     MI    ??? Heart Disease Brother      No Known Allergies    Problem List:      Patient Active Problem List   Diagnosis Code   ??? 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 Hepatitis C 070.70   ??? Thrombocytopenia, unspecified 287.5   ??? Alcohol abuse 305.00   ??? Anemia 285.9   ??? Hematemesis 578.0   ??? Melena 578.1   ??? Thrombocytopenia 287.5   ??? Alcoholic cirrhosis 571.2   ??? Chest pain 786.50   ??? GI bleed 578.9   ??? Acute upper GI bleeding 578.9       Medications:     Current Outpatient Prescriptions   Medication Sig   ??? ondansetron (ZOFRAN ODT) 8 mg disintegrating tablet Take  0.5 tablets by mouth every eight (8) hours as needed for Nausea for up to 12 doses.   ??? ondansetron hcl (ZOFRAN, AS HYDROCHLORIDE,) 4 mg tablet Take 1 tablet by mouth every eight (8) hours as needed for Nausea.   ??? ondansetron hcl (ZOFRAN, AS HYDROCHLORIDE,) 4 mg tablet Take 1 tablet by mouth every eight (8) hours as needed for Nausea for up to 12 doses.   ??? nadolol (CORGARD) 20 mg tablet Take 1 tablet by mouth daily.   ??? folic acid (FOLVITE) 1 mg tablet Take 1 tablet by mouth daily.   ??? multivitamin capsule Take 1 capsule by mouth daily.   ??? thiamine (B-1) 100 mg tablet Take 1 tablet by mouth daily.   ??? pantoprazole (PROTONIX) 40 mg tablet Take 1 tablet by mouth daily.   ??? ferrous sulfate 325 mg (65 mg iron) tablet Take 1 tablet by mouth Daily (before breakfast).   ??? oxyCODONE-acetaminophen (PERCOCET) 5-325 mg per tablet Take 1 tablet by mouth every four (4) hours as needed for Pain for up to 12 doses.   ??? OTHER Check CBC, BMP in 3 days, results to PCP   ??? oxyCODONE IR (ROXICODONE) 10 mg tab immediate release tablet Take 1 tablet by mouth every four (4) hours as needed for Pain.   ??? oxyCODONE-acetaminophen (PERCOCET) 5-325 mg per tablet Take 1-2 tablets by mouth every four (4) hours as needed for Pain.   ??? docusate sodium (COLACE) 100 mg capsule Take 1 capsule by mouth two (2) times a day for 90 days.   ??? oxyCODONE-acetaminophen (PERCOCET) 5-325 mg per tablet  Take 1 tablet by mouth every eight (8) hours as needed for Pain.   ??? spironolactone (ALDACTONE) 100 mg tablet Take 1 tablet by mouth daily.     No current facility-administered medications for this visit.         Constitutional: negative for fevers, chills, sweats and fatigue  Respiratory: negative for cough, sputum or wheezing  Cardiovascular: negative for chest pain, chest pressure/discomfort, dyspnea  Gastrointestinal: positive for abdominal pain, negative for nausea, vomiting, diarrhea and constipation    Physical Assessment:   VS:  BP 114/71   Pulse 64   Temp(Src) 97.4 ??F (36.3 ??C) (Oral)   Resp 18   Ht 5\' 10"  (1.778 m)   Wt 240 lb (108.863 kg)   BMI 34.44 kg/m2    Lab Results   Component Value Date/Time    Sodium 140 02/25/2013  2:45 PM    Potassium 3.8 02/25/2013  2:45 PM    Chloride 105 02/25/2013  2:45 PM    CO2 29 02/25/2013  2:45 PM    Anion gap 6 02/25/2013  2:45 PM    Glucose 93 02/25/2013  2:45 PM    BUN 13 02/25/2013  2:45 PM    Creatinine 1.08 02/25/2013  2:45 PM    BUN/Creatinine ratio 12 02/25/2013  2:45 PM    GFR est non-AA >60 02/25/2013  2:45 PM    Calcium 7.6 02/25/2013  2:45 PM    GFR est AA >60 02/25/2013  2:45 PM       General:   Well-groomed, obese, in no distress, pleasant, alert, appropriate and conversant.   Mouth:  Good dentition, oropharynx WNL without membranes, exudates, petechiae or ulcers  Cardiovasc:   RRR, no MRG.  Pulses 2+ and symmetric at distal extremities.  Pulmonary:   Lungs clear bilaterally.  Normal respiratory effort.  Abdomen:   Abdomen soft, NT, ND, NAB.  Extremities:   No edema, no TTP bilateral calves.  LEs warm and well-perfused.  Neuro:   Alert and oriented, no focal deficits. No facial asymmetry noted.  Skin:    No rashes or jaundice  Psych:  No pressured speech or abnormal thought content        Assessment/Plan & Orders:       1. Alcoholic cirrhosis of liver        Orders Placed This Encounter   ??? METABOLIC PANEL, COMPREHENSIVE   ??? MAGNESIUM   ??? CBC W/O DIFF       Patient reports a  long standing history of abdominal pain. Recently saw the life coach at [facility] which recommended he come here for Abdominal Pain. Patient is specifically requesting Roxicodone discussed with patient that we do not do this type of pain med and that additionally due to his alcohol abuse history he is not a candidate for our small population of pain patient that we manage with percocet and tramadol. Patient was offered flexeril, tramadol or robaxin but declined. Percocet not recommended in liver failure due to tylenol.     Recommended that patient follow up with GI, Dr. Thalia Bloodgood whom he saw during his last hospital stay. he was given their phone number and asked to call    Highly recommended that patient follow up with GI for abdominal pain    Follow up in 2-3 months    Westley Gambles, MD  Family Medicine  03/11/2013  12:36 PM

## 2013-03-08 NOTE — Progress Notes (Signed)
Chief Complaint   Patient presents with   ??? Hospital Follow Up     pt states that tp is here for follow up for pt abdominal pain pt states that pt is is pain pt states that Percocet doen,t work for pt but Roxicoden does help pt states "that pt abdominal pain feels like someone stuffed extra guts in pt abdomen"

## 2013-03-12 MED ORDER — OXYCODONE-ACETAMINOPHEN 5 MG-325 MG TAB
5-325 mg | ORAL | Status: AC
Start: 2013-03-12 — End: 2013-03-12
  Administered 2013-03-12: 20:00:00 via ORAL

## 2013-03-12 MED FILL — OXYCODONE-ACETAMINOPHEN 5 MG-325 MG TAB: 5-325 mg | ORAL | Qty: 1

## 2013-03-12 NOTE — ED Notes (Signed)
I have reviewed discharge instructions with the patient.  The patient verbalized understanding.

## 2013-03-12 NOTE — ED Provider Notes (Signed)
I was personally available for consultation in the emergency department. I have reviewed the chart prior to the patient's discharge and agree with the documentation recorded by the MLP, including the assessment, treatment plan, and disposition.

## 2013-03-12 NOTE — ED Provider Notes (Addendum)
HPI Comments: Pt presents reporting pain secondary to his umbilical hernia x 3-4 weeks.  States he ran out of his pain medicine and ever since then it has been hurting.  Per epic, went to fp office on Jan 16, and his request for roxicodone was denied.  Went to see general surgery today, but had the days mixed up, will see surgeon tomorrow.  Hx of etoh abuse.  Of note, pt has had 3 abd/pelvis ct scans in the past 30 days.  Denies fever, chills, N/V/D, SOB, chest pain, headache.  No other complaints reported.    The history is provided by the patient.        Past Medical History   Diagnosis Date   ??? Hypertension    ??? Ill-defined condition      anemia   ??? Anemia    ??? Varices    ??? Hepatitis C virus    ??? GI bleed    ??? Alcohol abuse    ??? Hernia         Past Surgical History   Procedure Laterality Date   ??? Upper gi endoscopy,ligat varix  01/20/2013               Family History   Problem Relation Age of Onset   ??? Heart Disease Father [id number]     MI   ??? Heart Disease Brother         History     Social History   ??? Marital Status: SINGLE     Spouse Name: N/A     Number of Children: N/A   ??? Years of Education: N/A     Occupational History   ??? Not on file.     Social History Main Topics   ??? Smoking status: Former Smoker   ??? Smokeless tobacco: Not on file   ??? Alcohol Use: Yes      Comment: occasional   ??? Drug Use: No   ??? Sexually Active: Not on file     Other Topics Concern   ??? Not on file     Social History Narrative   ??? No narrative on file          ALLERGIES: Review of patient's allergies indicates no known allergies.      Review of Systems   Constitutional: Negative for fever, chills, diaphoresis and fatigue.   HENT: Negative for ear pain, congestion, sore throat and neck pain.    Eyes: Negative for pain and visual disturbance.   Respiratory: Negative for cough, chest tightness and shortness of breath.    Cardiovascular: Negative for chest pain.   Gastrointestinal: Positive for abdominal pain. Negative for nausea, vomiting and  diarrhea.   Musculoskeletal: Negative for myalgias and arthralgias.   Skin: Negative for rash.   Neurological: Negative for dizziness, seizures, weakness and headaches.       Filed Vitals:    03/12/13 1353   BP: 119/77   Pulse: 82   Temp: 96.7 ??F (35.9 ??C)   Resp: 18   Weight: 104.327 kg (230 lb)   SpO2: 98%            Physical Exam   Nursing note and vitals reviewed.  Constitutional: He is oriented to person, place, and time. He appears well-developed and well-nourished.   HENT:   Head: Normocephalic and atraumatic.   Right Ear: External ear normal.   Left Ear: External ear normal.   Eyes: EOM are normal. Pupils are equal, round, and reactive  to light.   Neck: Normal range of motion. Neck supple.   Cardiovascular: Normal rate, regular rhythm, normal heart sounds and intact distal pulses.    Pulmonary/Chest: Effort normal and breath sounds normal.   Abdominal: Soft. Bowel sounds are normal. He exhibits no distension. There is no tenderness. There is no rebound and no guarding.   Reducible umbilical hernia   Musculoskeletal: Normal range of motion.   Neurological: He is alert and oriented to person, place, and time.   Skin: Skin is warm and dry.        MDM    Procedures    CLINICAL IMPRESSION:  1. Umbilical hernia        PLAN:  The patient will be discharged home.  The patient was reassured that these symptoms do not appear to represent a serious or life threatening condition at this time.  Warning signs of worsening condition were discussed and understood by the patient.  Based on patient's age, coexisting illness, exam, and the results of this ED evaluation, the decision to treat as an outpatient was made.  Based on the information available at time of discharge, acute pathology requiring immediate intervention was deemed relative unlikely.  While it is impossible to completely exclude the possibility of underlying serious disease or worsening of condition, I feel the relative likelihood is extremely low.  I  discussed this uncertainty with the patient, who understood ED evaluation and treatment and felt comfortable with the outpatient treatment plan.  All questions regarding care, test results, and follow up were answered.  The patient is stable and appropriate to discharge.  They understand that they should return to the emergency department for any new or worsening symptoms.  I stressed the importance of follow up for repeat assessment and possibly further evaluation/treatment.    F/UP W/ GENERAL SURGEON TOMORROW AS SCHEDULED    Pt w/ 3 abd/pelvis ct scans in past 30 days, not willing to scan again, reducible, soft umbilical hernia, no changes in it per pt, just more painful b/c ran out of percocet.  Will see general surgery tomorrow.  Medicated in ed.  Do not suspect strangulated/incarcerated hernia today.

## 2013-03-12 NOTE — ED Notes (Signed)
"  I have an umbilicus hernia and something has to be wrong it hurts like hell."

## 2013-03-25 ENCOUNTER — Inpatient Hospital Stay: Payer: Self-pay

## 2013-03-25 MED ORDER — FAMOTIDINE (PF) 20 MG/2 ML IV
20 mg/2 mL | Freq: Once | INTRAVENOUS | Status: AC
Start: 2013-03-25 — End: 2013-03-25
  Administered 2013-03-25: 13:00:00 via INTRAVENOUS

## 2013-03-25 MED ADMIN — lactated ringers infusion: INTRAVENOUS | @ 13:00:00 | NDC 00409795309

## 2013-03-25 MED ADMIN — fentaNYL citrate (PF) 50 mcg/mL injection: INTRAVENOUS | @ 15:00:00 | NDC 00641602401

## 2013-03-25 MED FILL — BD POSIFLUSH NORMAL SALINE 0.9 % INJECTION SYRINGE: INTRAMUSCULAR | Qty: 10

## 2013-03-25 MED FILL — LACTATED RINGERS IV: INTRAVENOUS | Qty: 1000

## 2013-03-25 MED FILL — FENTANYL CITRATE (PF) 50 MCG/ML IJ SOLN: 50 mcg/mL | INTRAMUSCULAR | Qty: 2

## 2013-03-25 MED FILL — FAMOTIDINE (PF) 20 MG/2 ML IV: 20 mg/2 mL | INTRAVENOUS | Qty: 2

## 2013-03-25 MED FILL — MIDAZOLAM 1 MG/ML IJ SOLN: 1 mg/mL | INTRAMUSCULAR | Qty: 2

## 2013-03-25 NOTE — Procedures (Signed)
See Scanned notes from procedure done today.     Taydon Nasworthy MD.  [phone] Beeper.

## 2013-03-25 NOTE — Other (Signed)
Jefm PettyRandolph Curtis Kimber  09/25/1950  [id number]    Situation:  Verbal report to Nelida MeuseGreg Chastang RN  Procedure: Procedure(s):  ENDOSCOPY with banding    Background:    Preoperative diagnosis: 571.2  070.54  456.1  789.59  553.1 esophageal varices      Postoperative diagnosis: esophageal varices with banding x 2    Operator:  Dr. Mervyn SkeetersA. Damle    Assistant(s): Endoscopy Technician-1: Leane ParaEloisa Sodusta; Rosalita ChessmanLoleta S Bass  Endoscopy RN-1: Leotis PainDebra L. Walton    Specimens: none    Assessment:  Intra-procedure medications     Anesthesia gave intra-procedure sedation and medications, see anesthesia flow sheet     Intravenous fluids: LR @ KVO     Vital signs stable     Abdominal assessment: round and soft. HOB elevated 50 degrees, skin warm and dry, respirations with ease, responds appropriately to verbal stimuli    Recommendation:  Discharge patient per MD order

## 2013-03-25 NOTE — H&P (Signed)
History and Physical reviewed; I have examined the patient and there are no pertinent changes.    Laney PastorANANT S Neko Boyajian, MD, MD   9:30 AM 03/25/2013  [facility] [place] [place]  [phone]  [email or web address]

## 2013-03-25 NOTE — Procedures (Signed)
See Scanned notes from procedure done today.     Rynlee Lisbon MD.  [phone] Beeper.

## 2013-03-28 MED FILL — DIPRIVAN 10 MG/ML INTRAVENOUS EMULSION: 10 mg/mL | INTRAVENOUS | Qty: 20

## 2013-03-28 MED FILL — LACTATED RINGERS IV: INTRAVENOUS | Qty: 1000

## 2013-03-28 MED FILL — GLYCOPYRROLATE 0.2 MG/ML IJ SOLN: 0.2 mg/mL | INTRAMUSCULAR | Qty: 2

## 2013-12-27 ENCOUNTER — Encounter

## 2013-12-31 ENCOUNTER — Inpatient Hospital Stay: Admit: 2013-12-31 | Payer: BLUE CROSS/BLUE SHIELD | Attending: Gastroenterology | Primary: Family Medicine

## 2013-12-31 DIAGNOSIS — B182 Chronic viral hepatitis C: Secondary | ICD-10-CM

## 2014-01-22 ENCOUNTER — Inpatient Hospital Stay: Payer: BLUE CROSS/BLUE SHIELD

## 2014-01-22 MED ORDER — FAMOTIDINE (PF) 20 MG/2 ML IV
20 mg/2 mL | Freq: Once | INTRAVENOUS | Status: AC
Start: 2014-01-22 — End: 2014-01-22
  Administered 2014-01-22: 12:00:00 via INTRAVENOUS

## 2014-01-22 MED ORDER — PROPOFOL 10 MG/ML IV EMUL
10 mg/mL | INTRAVENOUS | Status: DC | PRN
Start: 2014-01-22 — End: 2014-01-22
  Administered 2014-01-22 (×2): via INTRAVENOUS

## 2014-01-22 MED ORDER — LIDOCAINE (PF) 20 MG/ML (2 %) IJ SOLN
20 mg/mL (2 %) | INTRAMUSCULAR | Status: DC | PRN
Start: 2014-01-22 — End: 2014-01-22
  Administered 2014-01-22: 13:00:00 via INTRAVENOUS

## 2014-01-22 MED ORDER — SODIUM CHLORIDE 0.9 % IJ SYRG
Freq: Three times a day (TID) | INTRAMUSCULAR | Status: DC
Start: 2014-01-22 — End: 2014-01-22

## 2014-01-22 MED ORDER — SODIUM CHLORIDE 0.9 % IJ SYRG
INTRAMUSCULAR | Status: DC | PRN
Start: 2014-01-22 — End: 2014-01-22

## 2014-01-22 MED ORDER — LACTATED RINGERS IV
INTRAVENOUS | Status: DC
Start: 2014-01-22 — End: 2014-01-22
  Administered 2014-01-22: 12:00:00 via INTRAVENOUS

## 2014-01-22 MED FILL — BD POSIFLUSH NORMAL SALINE 0.9 % INJECTION SYRINGE: INTRAMUSCULAR | Qty: 10

## 2014-01-22 MED FILL — FAMOTIDINE (PF) 20 MG/2 ML IV: 20 mg/2 mL | INTRAVENOUS | Qty: 2

## 2014-01-22 MED FILL — LACTATED RINGERS IV: INTRAVENOUS | Qty: 1000

## 2014-01-22 NOTE — Anesthesia Post-Procedure Evaluation (Signed)
Post-Anesthesia Evaluation and Assessment    Patient: Todd PettyRandolph Curtis Strong MRN: [id number]  SSN: [id number]    Date of Birth: 03/02/1950  Age: 63 y.o.  Sex: male       Cardiovascular Function/Vital Signs  Visit Vitals   Item Reading   ??? BP 91/52 mmHg   ??? Pulse 92   ??? Temp 36.6 ??C (97.9 ??F)   ??? Resp 18   ??? Ht 5' 9.5" (1.765 m)   ??? Wt 108.863 kg (240 lb)   ??? BMI 34.95 kg/m2   ??? SpO2 99%       Patient is status post MAC anesthesia for Procedure(s):  UPPER ENDOSCOPY with band ligation.    Nausea/Vomiting: None    Postoperative hydration reviewed and adequate.    Pain: none    Neurological Status:       At baseline    Mental Status and Level of Consciousness: Alert and oriented     Pulmonary Status:   Room air  Adequate oxygenation and airway patent    Complications related to anesthesia: None    Post-anesthesia assessment completed. No concerns    Signed By: Liston AlbaJU-FANG Anniah Glick, MD     January 22, 2014

## 2014-01-22 NOTE — Addendum Note (Signed)
Addendum  created 01/22/14 0809 by Liston AlbaJu-Fang Corrigan Kretschmer, MD    Modules edited: Anesthesia Medication Administration

## 2014-01-22 NOTE — Anesthesia Pre-Procedure Evaluation (Signed)
Anesthetic History     PONV          Review of Systems / Medical History  Patient summary reviewed, nursing notes reviewed and pertinent labs reviewed    Pulmonary                   Neuro/Psych              Cardiovascular    Hypertension: well controlled                   GI/Hepatic/Renal           Liver disease     Endo/Other        Obesity     Other Findings              Physical Exam    Airway  Mallampati: III  TM Distance: > 6 cm  Neck ROM: normal range of motion   Mouth opening: Normal     Cardiovascular    Rhythm: regular  Rate: normal         Dental    Dentition: Poor dentition  Comments: Multiple missing. #9 , #24, 25 broken.   Pulmonary  Breath sounds clear to auscultation               Abdominal  GI exam deferred       Other Findings            Anesthetic Plan    ASA: 3  Anesthesia type: MAC          Induction: Intravenous  Anesthetic plan and risks discussed with: Patient

## 2014-01-22 NOTE — H&P (Signed)
H&P is updated and reviewed, physical exam was performed and medications/allergies were reviewed immediately prior to anesthesia.    Dan Valori Hollenkamp, MD

## 2014-01-23 MED FILL — DIPRIVAN 10 MG/ML INTRAVENOUS EMULSION: 10 mg/mL | INTRAVENOUS | Qty: 20

## 2014-01-23 MED FILL — LIDOCAINE (PF) 20 MG/ML (2 %) IJ SOLN: 20 mg/mL (2 %) | INTRAMUSCULAR | Qty: 5

## 2014-03-03 LAB — CBC WITH AUTOMATED DIFF
ATYPICAL LYMPHS: 3.8 % — ABNORMAL HIGH (ref 0–0)
BAND NEUTROPHILS: 2.9 % (ref 0–11)
BASOPHILS: 4.8 % — ABNORMAL HIGH (ref 0–3)
EOSINOPHILS: 7.7 % — ABNORMAL HIGH (ref 0–5)
HCT: 35.5 % — ABNORMAL LOW (ref 37.0–50.0)
HGB: 11.9 gm/dl — ABNORMAL LOW (ref 12.4–17.2)
LYMPHOCYTES: 8.7 % — ABNORMAL LOW (ref 28–48)
MCH: 31.8 pg (ref 23.0–34.6)
MCHC: 33.5 gm/dl (ref 30.0–36.0)
MCV: 94.9 fL (ref 80.0–98.0)
MONOCYTES: 1.9 % (ref 1–13)
MPV: 11.3 fL — ABNORMAL HIGH (ref 6.0–10.0)
NEUTROPHILS: 70.2 % — ABNORMAL HIGH (ref 34–64)
NRBC: 0 (ref 0–0)
PLATELET COMMENTS: DECREASED
PLATELET: 68 10*3/uL — ABNORMAL LOW (ref 140–450)
RBC: 3.74 M/uL — ABNORMAL LOW (ref 3.80–5.70)
RDW-SD: 51.9 — ABNORMAL HIGH (ref 35.1–43.9)
WBC: 5.9 10*3/uL (ref 4.0–11.0)

## 2014-03-03 LAB — POC FECAL OCCULT BLOOD: Occult blood, stool: NEGATIVE — AB

## 2014-03-03 LAB — METABOLIC PANEL, COMPREHENSIVE
ALT (SGPT): 78 U/L (ref 12–78)
AST (SGOT): 84 U/L — ABNORMAL HIGH (ref 15–37)
Albumin: 2.9 gm/dl — ABNORMAL LOW (ref 3.4–5.0)
Alk. phosphatase: 91 U/L (ref 45–117)
BUN: 10 mg/dl (ref 7–25)
Bilirubin, total: 1.2 mg/dl — ABNORMAL HIGH (ref 0.2–1.0)
CO2: 28 mEq/L (ref 21–32)
Calcium: 7.7 mg/dl — ABNORMAL LOW (ref 8.5–10.1)
Chloride: 107 mEq/L (ref 98–107)
Creatinine: 0.7 mg/dl (ref 0.6–1.3)
GFR est AA: >60
GFR est non-AA: >60
Glucose: 98 mg/dl (ref 74–106)
Potassium: 3.7 mEq/L (ref 3.5–5.1)
Protein, total: 6.4 gm/dl (ref 6.4–8.2)
Sodium: 142 mEq/L (ref 136–145)

## 2014-03-03 LAB — LIPASE: Lipase: 360 U/L (ref 73–393)

## 2014-03-03 NOTE — ED Provider Notes (Addendum)
[facility]  EMERGENCY DEPARTMENT TREATMENT REPORT  NAME:  [name], [name]  SEX:   M  ADMIT: 03/03/2014  DOB:   April 02, 1950  MR#    [id number]  ROOM:  [id number]  TIME DICTATED: 02 43 PM  ACCT#  [id number]    cc: Jeannett SeniorSTEPHEN TO M.D.    I hereby certify this patient for admission based upon medical necessity as  noted below.      TIME OF EVALUATION:  12:41 p.m.    CHIEF COMPLAINT:  Abdominal pain, nausea, vomiting, diarrhea.    HISTORY OF PRESENT ILLNESS:  This is a 64 year old male with history of hepatitis and liver cirrhosis  coming in today for evaluation of acute on chronic generalized abdominal pain.  He states it has been on and off for months.  This acute bout began 3 days ago  with pain all over his belly accompanied by nausea, vomiting and diarrhea.  He  is not sure how many times a day he has episodes of emesis or diarrhea he  says, but it seems like it never ends.  He denies any melena or hematochezia  or known fevers.  He admits he was recently hospitalized about 2 weeks ago for  this in [place].  He is a Naval architecttruck driver and travels frequently.  He happened  to be in [place] when an acute bout hit him.  He was told that the cause was  his liver due to his chronic cirrhosis and hepatitis.  He also had evaluation  during his hospitalization by gastroenterology who scoped him and told him he  had ulcers in his stomach, and he was told to stop taking ibuprofen.  He  admits he was taking a lot of ibuprofen for his abdominal pain prior  hospitalization as he was told in the past to avoid Tylenol.  The patient  admits he is aware he has an umbilical hernia.  He was in the process of  getting that evaluated for elective operative repair by Dr. Juel BurrowLin today when Dr.  Juel BurrowLin advised him to proceed here for his discomfort, vomiting and diarrhea.  The patient denies any chest pain or shortness of breath.  No lower extremity  edema.     REVIEW OF SYSTEMS:  CONSTITUTIONAL:  No fevers.  ENT:  No URI symptoms.   ENDOCRINE:  No diabetic symptoms.   RESPIRATORY:  No shortness of breath or cough.  CARDIOVASCULAR:  No chest pain.  GASTROINTESTINAL:  Generalized abdominal discomfort, nausea, vomiting,  diarrhea.  No melena or hematochezia.  GENITOURINARY:  No dysuria.  MUSCULOSKELETAL:  No joint pain or swelling.  INTEGUMENTARY:  No rashes.  NEUROLOGIC:  No headache.    PAST MEDICAL HISTORY:  Hepatitis, cirrhosis.    FAMILY HISTORY:  Not provided.    SOCIAL HISTORY:  He denies alcohol abuse.  He was formerly a drinker.  In the past has  experiment with drugs many years ago.  Denies current illicit drug or tobacco  use.  Lives at home and works as a Naval architecttruck driver.    ALLERGIES:  REVIEWED IN IBEX.    MEDICATIONS:  Reviewed in Ibex.    PHYSICAL EXAMINATION:  VITAL SIGNS:  Blood pressure 143/87, pulse 69, respirations 18, temperature  97.7, saturations 98% on room air.  GENERAL APPEARANCE:  Patient appears well developed and well nourished.  Appearance and behavior are age and situation appropriate.   ENT:  Oral mucosa is dry.  NECK:  Supple, symmetrical.  Trachea appears midline.  EYES:  Very minimal scleral icterus visible otherwise unremarkable.  Eyes  assessment with pupils that are PERRL.  RESPIRATORY:  Clear and equal breath sounds.  No respiratory distress,  tachypnea, or accessory muscle use.    CARDIOVASCULAR:   Heart regular, without murmurs, gallops, rubs, or thrills.    GASTROINTESTINAL:  Abdomen is mildly distended and generally uncomfortable to  palpation.  No focal tenderness elicited including the right upper quadrant.  Negative Murphy's sign.  No guarding or rebound.  On inspection of the belly,  the patient has several scratch marks present where he says his belly gets  itchy and he scratches it, and he has a small visible umbilical hernia that is  easily reducible 1 cm well circumscribed in the center portion of the   umbilicus.  MUSCULOSKELETAL:  Stance and gait appear normal.    SKIN:  Warm and dry, other than aforementioned itching marks on his belly.    NEUROLOGIC:  He is A&O times 3.    INITIAL ASSESSMENT AND MANAGEMENT PLAN:  This is a 64 year old male coming in for evaluation of what is likely  abdominal pain, nausea, vomiting, diarrhea resulting from his acute on chronic  liver disease.  We will do belly labs and treat him symptomatically.      CONTINUATION BY Vinnie Langton, MD:      The patient complains of continuation of his vomiting and diarrhea that he had  in [place].  He was admitted there.  CT scan which he showed, results are  essentially negative.  He was treated and improved, flew home, started having  similar symptoms yesterday and day before yesterday he had too many to count  episodes of vomiting and diarrhea and dehydrated.  He has history of  cirrhosis.  Stool here on my exam was heme negative.      LABORATORY TESTS:    White count 5.9, H&H 35 and 12.  Normal CMP essentially with bilirubin of  1.2, SGOT 84.  Normal lipase.      COURSE IN THE EMERGENCY DEPARTMENT:    The patient given IV fluids but still had a lot of crampy abdominal pain,  could not tolerate oral fluids.  Started getting a dose of morphine for pain,  Zofran for nausea.  Continued to be hydrated here and was placed in short stay  status overnight again on Dr. Verdis Prime service until the patient can improve  enough to be tolerating fluids on his own.      CLINICAL IMPRESSION:    1.  Acute dehydration secondary to intractable vomiting.    2.  Diarrhea, intractable.    3.  History of gastroenteritis with recent admission.    4.  Cirrhosis, unknown type.      ___________________  Smitty Cords MD  Dictated By: Bettey Mare. Earlene Plater, PA-C    My signature above authenticates this document and my orders, the final  diagnosis (es), discharge prescription (s), and instructions in the [facility] record.  Nursing notes have been reviewed by the physician/mid-level provider.     If you have any questions please contact [phone].    LH  D:03/03/2014 14:43:05  T: 03/03/2014 17:14:25  [id number]  Electronically Authenticated by:  Smitty Cords, M.D. On 03/04/2014 05:02 PM EST

## 2014-03-03 NOTE — H&P (Addendum)
[facility]  History and Physical  NAME:  [name], [name]  SEX:   M  ADMIT: 03/03/2014  DOB:[birth year]  MR#    [id number]  ROOM:  [id number]  ACCT#  [id number]    I hereby certify this patient for admission based upon medical necessity as  noted below:    <    CHIEF COMPLAINT:  Nausea, vomiting, diarrhea and abdominal discomfort.    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  He was admitted to a hospital in [place] where he  had an endoscopy which showed peptic ulcer disease.  According to him, he was  taking a lot of Motrin before he was admitted in [place] because he had some  abdominal pain complaints.  It was decided that it was probably because of the  Motrin that he had peptic ulcer disease and was asked  not to take any more  Motrin in the future.  He had a CT scan of the abdomen done in [place] which  showed cirrhosis and ascites, but no acuity.    He says that his appetite is   down and does not feel like eating anything.    REVIEW OF SYSTEMS:  CONSTITUTIONAL:  Negative for fever.  HEENT:  Negative for blurry vision.  RESPIRATORY:  Negative for shortness of breath.  CARDIOVASCULAR:  Negative for orthopnea, PND.  GASTROINTESTINAL:  Negative for any hematemesis or bright red blood per rectum  or any blood in the stool. Positive for nausea, vomiting and diarrhea and also  diffuse abdominal pain; however, currently denies any abdominal discomfort.   GENITOURINARY:  Negative for urgency.  ENDOCRINE:  Negative for thirst.  SKIN:  Negative for rashes.  MUSCULOSKELETAL:  Negative for arthralgia.  HEMATOLOGICAL:  Negative for bruising.  NEUROLOGICAL:  Negative for headache or focal weakness.  PSYCHIATRIC:  Negative for anxiety.     PAST MEDICAL HISTORY:   Peptic ulcer disease, cirrhosis, hernia.    PAST SURGICAL HISTORY:  Left leg surgery.    HOME MEDICATIONS:  He says that he was just on a couple of medications when he was [facility]  [facility] but does not remember the name of the medications.     The reports that he has currently only includes a CAT scan of the abdomen  without any comment on the discharge medications or the summary.     ALLERGIES:  NO KNOWN DRUG ALLERGIES.    SOCIAL HISTORY:  Does not smoke.  He says that he quit drinking.    FAMILY HISTORY:  Noncontributory.    PHYSICAL EXAMINATION:  VITAL SIGNS:  Afebrile, blood pressure 143/87, heart rate 69, respiratory rate  18, pulse ox is within normal limits.  GENERAL:  A 64 year old who does not appear to be in any acute distress at  this current moment.  HEENT:  Pupils equal, reactive to light and accommodation.  NECK:  Supple.  RESPIRATORY:  Bilaterally clear to auscultation.  CARDIOVASCULAR:  S1 and S2 normal, regular rate and rhythm.  ABDOMEN:  Distension seen.  Fluid thrill positive.  Shifting dullness noted,  otherwise soft.  EXTREMITIES:  Without any edema.  SKIN:  Without any rash.  NEUROLOGIC:  Intact times 3.  Nonfocal  on exam.    LABORATORY DATA:  Workup in the ED:  Hemoglobin 11.9, platelets 68, bilirubin 1.2, ALT 78,  albumin 2.9.    ASSESSMENT AND PLAN:  1.  Nausea, vomiting and diarrhea, rule out gastroenteritis versus other  infectious etiology.  2.  Recently diagnosed peptic ulcer disease.  3.  History of cirrhosis.  4.  History of abdominal hernia.    PLAN:  1.  We will start him on clear liquids.  2.  We will get a CAT scan of the abdomen.  3.  Stool cultures and C. difficile.  4.  Need to get the EGD biopsy report from the hospital in [place] to  determine the H. pylori status.  5.  We will have GI comment on his complaints.  6.  We will start him on high dose PPI twice a day.  7.  We will use mild dose of narcotic pain medication to help him with the   pain.   8.  P.r.n. antiemetics.      ___________________  Rosalia Hammers MD  Dictated By: .   JMB  D:03/03/2014 16:06:35  T: 03/03/2014 16:28:52  [id number]  Electronically Authenticated and Edited by:  [name], MD On 03/03/2014 10:10 PM EST

## 2014-03-04 LAB — METABOLIC PANEL, BASIC
BUN: 9 mg/dl (ref 7–25)
CO2: 25 mEq/L (ref 21–32)
Calcium: 7.1 mg/dl — ABNORMAL LOW (ref 8.5–10.1)
Chloride: 108 mEq/L — ABNORMAL HIGH (ref 98–107)
Creatinine: 0.6 mg/dl (ref 0.6–1.3)
GFR est AA: >60
GFR est non-AA: >60
Glucose: 81 mg/dl (ref 74–106)
Potassium: 3.8 mEq/L (ref 3.5–5.1)
Sodium: 142 mEq/L (ref 136–145)

## 2014-03-04 LAB — CBC WITH AUTOMATED DIFF
BASOPHILS: 1.3 % (ref 0–3)
EOSINOPHILS: 0.2 % (ref 0–5)
HCT: 32.9 % — ABNORMAL LOW (ref 37.0–50.0)
HGB: 10.8 gm/dl — ABNORMAL LOW (ref 12.4–17.2)
IMMATURE GRANULOCYTES: 0.4 % (ref 0.0–3.0)
LYMPHOCYTES: 19 % — ABNORMAL LOW (ref 28–48)
MCH: 31.7 pg (ref 23.0–34.6)
MCHC: 32.8 gm/dl (ref 30.0–36.0)
MCV: 96.5 fL (ref 80.0–98.0)
MONOCYTES: 13.4 % — ABNORMAL HIGH (ref 1–13)
MPV: 11.7 fL — ABNORMAL HIGH (ref 6.0–10.0)
NEUTROPHILS: 65.7 % — ABNORMAL HIGH (ref 34–64)
NRBC: 0 (ref 0–0)
PLATELET: 55 10*3/uL — ABNORMAL LOW (ref 140–450)
RBC: 3.41 M/uL — ABNORMAL LOW (ref 3.80–5.70)
RDW-SD: 52.1 — ABNORMAL HIGH (ref 35.1–43.9)
WBC: 4.5 10*3/uL (ref 4.0–11.0)

## 2014-03-04 LAB — LACTIC ACID: Lactic Acid: 2 mmol/L (ref 0.4–2.0)

## 2014-03-04 LAB — C. DIFFICILE/EPI PCR: C. diff toxin by PCR: NEGATIVE

## 2014-03-04 NOTE — Consults (Addendum)
[facility]  CONSULTATION REPORT  NAME:  [name], [place]  SEX:   M  ADMIT: 03/03/2014  DATE OF CONSULT: 03/04/2014  REFERRING PHYSICIAN:    DOB: 1950/10/03  MR#    [id number]  ROOM:  [id number]  ACCT#  [id number]    cc: [name] MD    REQUESTING PHYSICIAN:    Dr. Allegra Lai    REASON FOR CONSULTATION:    Abdominal pain.      HISTORY OF PRESENT ILLNESS:    The patient is a 64 year old male who was seen by me in my office for an  umbilical hernia.  During the course of evaluation, it was noted that he had a  history of cirrhosis and diffuse abdominal pain that did not seem to be  related to his hernia.  Presented to the Emergency Room for evaluation.  He  was evaluated in the Emergency Room, admitted to medicine for further workup  of his abdominal pain.  CT scan was obtained that showed a fat-containing  umbilical hernia as well as cirrhotic liver and ascites and distended  gallbladder.      PAST MEDICAL HISTORY:    Significant for peptic ulcer disease, cirrhosis, hernia.      PAST SURGICAL HISTORY:    Significant for left leg surgery.      SOCIAL HISTORY:    Negative times 3.  He used to be a very heavy drinking, which he has stopped.        FAMILY HISTORY:    Noncontributory.      ALLERGIES:    HE HAS NO KNOWN DRUG ALLERGIES.      REVIEW OF SYSTEMS:    The patient denies fevers, chills, nausea, vomiting, diarrhea or constipation,  headaches.  Positive for shortness of breath.  He does complain of severe  abdominal pain that has been going on for approximately 2 months.  It is  diffuse.      PHYSICAL EXAMINATION:    VITAL SIGNS:  He is afebrile.  Vital signs are stable.    HEENT:  Sclerae are anicteric.    GENERAL:  He is alert and oriented, in no acute distress.    RESPIRATORY:  He is clear to auscultation bilaterally.    CARDIOVASCULAR:  He has a regular rate and rhythm.    ABDOMEN:  He is soft, moderately distended.  He is diffusely tender to   palpation with a large palpable hardened liver.  He does have an umbilical  hernia that is reducible.    EXTREMITIES:  No edema, cyanosis, or clubbing.    SKIN:  No ecchymosis or decubiti.      LABORATORY DATA:    White blood cell count 4.5, hemoglobin 10.8, platelet count 55, 65 %  neutrophils.  He did present with a sodium of 142, potassium 3.7, chloride  107, carbon dioxide 28, BUN 10, creatinine 7, glucose 98, AST, ALT 84 and 78,  bilirubin 1.2, albumin 2.9, alkaline phosphatase 91, lactate 2.0, lipase 360.        Radiologic studies:  The patient did have a CT scan that showed distended  gallbladder as well as a cirrhotic liver and ascites as well as portal  hypertension with varices.  Right upper quadrant ultrasound was obtained that  shows a cirrhotic liver with distended gallbladder without stones or Murphy  sign with a 3 mm wall and normal common bile duct.      IMPRESSION:    Diffuse abdominal pain.  He has a multitude  of etiologies as the cause of his  pain including pain from his liver, distended gallbladder as well as the  hernia.  Based on calculation, he appears to be a Child's B which puts him at  a moderate amount of risk for a moderate amount of risk for any surgical  intervention.  I agree with a GI consult to evaluate liver function and liver  status.  We will get a HIDA scan to evaluate the acalculous cholecystitis.  No  stones were seen on right upper quadrant ultrasound.  He will need to be  medically optimized prior to any surgical intervention.  He will need his  hernia reported at some point, though it does not appear to be an emergency at  this time.  If he is able to be medical managed, we can consider surgical  intervention during this hospital stay, though I do not think the hernia is  the cause of his diffuse abdominal pain.      ___________________  Marguarite ArbourStephen H Krystal Teachey MD  Dictated By:.   LR  D:03/04/2014 22:30:51  T: 03/04/2014 23:24:56  [id number]  Electronically Authenticated and Edited by:   Marguarite ArbourSTEPHEN H. Shacola Schussler, MD On 03/19/2014 01:49 PM EST

## 2014-03-05 LAB — CBC WITH AUTOMATED DIFF
BASOPHILS: 1.2 % (ref 0–3)
EOSINOPHILS: 0 % (ref 0–5)
HCT: 30.8 % — ABNORMAL LOW (ref 37.0–50.0)
HGB: 10.6 gm/dl — ABNORMAL LOW (ref 12.4–17.2)
IMMATURE GRANULOCYTES: 0.3 % (ref 0.0–3.0)
LYMPHOCYTES: 17.7 % — ABNORMAL LOW (ref 28–48)
MCH: 32.3 pg (ref 23.0–34.6)
MCHC: 34.4 gm/dl (ref 30.0–36.0)
MCV: 93.9 fL (ref 80.0–98.0)
MONOCYTES: 14.6 % — ABNORMAL HIGH (ref 1–13)
MPV: 11 fL — ABNORMAL HIGH (ref 6.0–10.0)
NEUTROPHILS: 66.2 % — ABNORMAL HIGH (ref 34–64)
NRBC: 0 (ref 0–0)
PLATELET: 51 10*3/uL — ABNORMAL LOW (ref 140–450)
RBC: 3.28 M/uL — ABNORMAL LOW (ref 3.80–5.70)
RDW-SD: 49.1 — ABNORMAL HIGH (ref 35.1–43.9)
WBC: 3.2 10*3/uL — ABNORMAL LOW (ref 4.0–11.0)

## 2014-03-05 LAB — METABOLIC PANEL, BASIC
BUN: 8 mg/dl (ref 7–25)
CO2: 27 mEq/L (ref 21–32)
Calcium: 7.1 mg/dl — ABNORMAL LOW (ref 8.5–10.1)
Chloride: 107 mEq/L (ref 98–107)
Creatinine: 0.6 mg/dl (ref 0.6–1.3)
GFR est AA: >60
GFR est non-AA: >60
Glucose: 83 mg/dl (ref 74–106)
Potassium: 3.6 mEq/L (ref 3.5–5.1)
Sodium: 142 mEq/L (ref 136–145)

## 2014-03-05 LAB — AMYLASE, FLUID: Amylase, body fld.: 13

## 2014-03-05 LAB — HEPATIC FUNCTION PANEL
ALT (SGPT): 68 U/L (ref 12–78)
AST (SGOT): 83 U/L — ABNORMAL HIGH (ref 15–37)
Albumin: 2.5 gm/dl — ABNORMAL LOW (ref 3.4–5.0)
Alk. phosphatase: 78 U/L (ref 45–117)
Bilirubin, direct: 0.4 mg/dl — ABNORMAL HIGH (ref 0.0–0.2)
Bilirubin, total: 1 mg/dl (ref 0.2–1.0)
Protein, total: 5.7 gm/dl — ABNORMAL LOW (ref 6.4–8.2)

## 2014-03-05 LAB — CELL COUNT AND DIFF, BODY FLUID
FLD LYMPHS: 45
FLD MONOCYTES: 38
FLD NEUTROPHILS: 2
FLD OTHER CELLS: 15
FLUID WBC COUNT: 178 /mm3
TOTAL NUCLEATED CELLS: 197 /mm3

## 2014-03-05 LAB — HEPATITIS PANEL, ACUTE
Hepatitis A, IgM: NONREACTIVE
Hepatitis B core, IgM: NONREACTIVE
Hepatitis B surface Ag: NONREACTIVE
Hepatitis C virus Ab: REACTIVE — CR
Signal to Cutoff (Hep C): >11

## 2014-03-05 LAB — TRIGLYCERIDES, FLUID: Triglyceride, body fld.: 9 mg/dl

## 2014-03-05 LAB — PROTEIN TOTAL, FLUID: Protein total, body fld.: 1 gm/dl

## 2014-03-05 LAB — PROTHROMBIN TIME + INR
INR: 1.5 — ABNORMAL HIGH (ref 0.0–1.1)
Prothrombin time: 17.2 seconds — ABNORMAL HIGH (ref 11.5–14.0)

## 2014-03-05 LAB — ALBUMIN, FLUID: Albumin, body fld.: 0

## 2014-03-06 LAB — METABOLIC PANEL, BASIC
BUN: 7 mg/dl (ref 7–25)
CO2: 29 mEq/L (ref 21–32)
Calcium: 7.5 mg/dl — ABNORMAL LOW (ref 8.5–10.1)
Chloride: 105 mEq/L (ref 98–107)
Creatinine: 0.7 mg/dl (ref 0.6–1.3)
GFR est AA: >60
GFR est non-AA: >60
Glucose: 95 mg/dl (ref 74–106)
Potassium: 3.8 mEq/L (ref 3.5–5.1)
Sodium: 143 mEq/L (ref 136–145)

## 2014-03-06 LAB — CBC WITH AUTOMATED DIFF
BASOPHILS: 1 % (ref 0–3)
EOSINOPHILS: 4 % (ref 0–5)
HCT: 33.4 % — ABNORMAL LOW (ref 37.0–50.0)
HGB: 11.3 gm/dl — ABNORMAL LOW (ref 12.4–17.2)
LYMPHOCYTES: 4 % — ABNORMAL LOW (ref 28–48)
MCH: 32 pg (ref 23.0–34.6)
MCHC: 33.8 gm/dl (ref 30.0–36.0)
MCV: 94.6 fL (ref 80.0–98.0)
MONOCYTES: 9.1 % (ref 1–13)
MPV: 11 fL — ABNORMAL HIGH (ref 6.0–10.0)
NEUTROPHILS: 81.8 % — ABNORMAL HIGH (ref 34–64)
PLATELET COMMENTS: DECREASED
PLATELET: 50 10*3/uL — ABNORMAL LOW (ref 140–450)
RBC Morphology: NORMAL
RBC: 3.53 M/uL — ABNORMAL LOW (ref 3.80–5.70)
RDW-SD: 49.1 — ABNORMAL HIGH (ref 35.1–43.9)
WBC: 3.7 10*3/uL — ABNORMAL LOW (ref 4.0–11.0)

## 2014-03-06 LAB — HEPATIC FUNCTION PANEL
ALT (SGPT): 73 U/L (ref 12–78)
AST (SGOT): 91 U/L — ABNORMAL HIGH (ref 15–37)
Albumin: 2.7 gm/dl — ABNORMAL LOW (ref 3.4–5.0)
Alk. phosphatase: 91 U/L (ref 45–117)
Bilirubin, direct: 0.4 mg/dl — ABNORMAL HIGH (ref 0.0–0.2)
Bilirubin, total: 1 mg/dl (ref 0.2–1.0)
Protein, total: 6.3 gm/dl — ABNORMAL LOW (ref 6.4–8.2)

## 2014-03-07 LAB — CBC WITH AUTOMATED DIFF
BASOPHILS: 1.3 % (ref 0–3)
EOSINOPHILS: 0 % (ref 0–5)
HCT: 34.4 % — ABNORMAL LOW (ref 37.0–50.0)
HGB: 11.6 gm/dl — ABNORMAL LOW (ref 12.4–17.2)
IMMATURE GRANULOCYTES: 0.3 % (ref 0.0–3.0)
LYMPHOCYTES: 13.8 % — ABNORMAL LOW (ref 28–48)
MCH: 31.5 pg (ref 23.0–34.6)
MCHC: 33.7 gm/dl (ref 30.0–36.0)
MCV: 93.5 fL (ref 80.0–98.0)
MONOCYTES: 18.4 % — ABNORMAL HIGH (ref 1–13)
MPV: 11.2 fL — ABNORMAL HIGH (ref 6.0–10.0)
NEUTROPHILS: 66.2 % — ABNORMAL HIGH (ref 34–64)
NRBC: 0 (ref 0–0)
PLATELET: 52 10*3/uL — ABNORMAL LOW (ref 140–450)
RBC: 3.68 M/uL — ABNORMAL LOW (ref 3.80–5.70)
RDW-SD: 49.2 — ABNORMAL HIGH (ref 35.1–43.9)
WBC: 3.9 10*3/uL — ABNORMAL LOW (ref 4.0–11.0)

## 2014-03-07 LAB — METABOLIC PANEL, BASIC
BUN: 10 mg/dl (ref 7–25)
CO2: 28 mEq/L (ref 21–32)
Calcium: 7.6 mg/dl — ABNORMAL LOW (ref 8.5–10.1)
Chloride: 104 mEq/L (ref 98–107)
Creatinine: 0.7 mg/dl (ref 0.6–1.3)
GFR est AA: >60
GFR est non-AA: >60
Glucose: 141 mg/dl — ABNORMAL HIGH (ref 74–106)
Potassium: 3.6 mEq/L (ref 3.5–5.1)
Sodium: 141 mEq/L (ref 136–145)

## 2014-03-07 LAB — HEPATIC FUNCTION PANEL
ALT (SGPT): 74 U/L (ref 12–78)
AST (SGOT): 86 U/L — ABNORMAL HIGH (ref 15–37)
Albumin: 2.8 gm/dl — ABNORMAL LOW (ref 3.4–5.0)
Alk. phosphatase: 87 U/L (ref 45–117)
Bilirubin, direct: 0.4 mg/dl — ABNORMAL HIGH (ref 0.0–0.2)
Bilirubin, total: 1.1 mg/dl — ABNORMAL HIGH (ref 0.2–1.0)
Protein, total: 6.5 gm/dl (ref 6.4–8.2)

## 2014-03-07 LAB — HCV REFLEX TO QUANT RT PCR
Hepatitis C virus Ab: REACTIVE — CR
Signal to Cutoff (Hep C): >11

## 2014-03-09 LAB — HEP C GENOTYPE

## 2014-03-12 LAB — METABOLIC PANEL, COMPREHENSIVE
ALT (SGPT): 70 U/L (ref 12–78)
AST (SGOT): 88 U/L — ABNORMAL HIGH (ref 15–37)
Albumin: 3.5 gm/dl (ref 3.4–5.0)
Alk. phosphatase: 103 U/L (ref 45–117)
BUN: 16 mg/dl (ref 7–25)
Bilirubin, total: 1.2 mg/dl — ABNORMAL HIGH (ref 0.2–1.0)
CO2: 29 mEq/L (ref 21–32)
Calcium: 8.3 mg/dl — ABNORMAL LOW (ref 8.5–10.1)
Chloride: 102 mEq/L (ref 98–107)
Creatinine: 1.1 mg/dl (ref 0.6–1.3)
GFR est AA: >60
GFR est non-AA: >60
Glucose: 123 mg/dl — ABNORMAL HIGH (ref 74–106)
Potassium: 4.2 mEq/L (ref 3.5–5.1)
Protein, total: 8 gm/dl (ref 6.4–8.2)
Sodium: 138 mEq/L (ref 136–145)

## 2014-03-12 LAB — CBC WITH AUTOMATED DIFF
BASOPHILS: 1.1 % (ref 0–3)
EOSINOPHILS: 0 % (ref 0–5)
HCT: 41.2 % (ref 37.0–50.0)
HGB: 14.2 gm/dl (ref 12.4–17.2)
IMMATURE GRANULOCYTES: 0.5 % (ref 0.0–3.0)
LYMPHOCYTES: 12.7 % — ABNORMAL LOW (ref 28–48)
MCH: 32.3 pg (ref 23.0–34.6)
MCHC: 34.5 gm/dl (ref 30.0–36.0)
MCV: 93.6 fL (ref 80.0–98.0)
MONOCYTES: 12.5 % (ref 1–13)
MPV: 12.5 fL — ABNORMAL HIGH (ref 6.0–10.0)
NEUTROPHILS: 73.2 % — ABNORMAL HIGH (ref 34–64)
NRBC: 0 (ref 0–0)
PLATELET: 70 10*3/uL — ABNORMAL LOW (ref 140–450)
RBC: 4.4 M/uL (ref 3.80–5.70)
RDW-SD: 47.8 — ABNORMAL HIGH (ref 35.1–43.9)
WBC: 5.7 10*3/uL (ref 4.0–11.0)

## 2014-03-12 LAB — LIPASE: Lipase: 133 U/L (ref 73–393)

## 2014-03-12 LAB — AMMONIA: Ammonia, plasma: 86 umol/L — ABNORMAL HIGH (ref 25–32)

## 2014-03-12 LAB — GLUCOSE, POC: Glucose (POC): 110 mg/dL — ABNORMAL HIGH (ref 65–105)

## 2014-03-12 LAB — POC FECAL OCCULT BLOOD: Occult blood, stool: NEGATIVE — AB

## 2014-03-12 NOTE — H&P (Addendum)
[facility]  History and Physical  NAME:  Deloria LairWILLIAMS, Emeterio  SEX:   M  ADMIT: 03/12/2014  DOB:1950/10/17  MR#    [id number]  ROOM:  [id number]  ACCT#  [id number]    I hereby certify this patient for admission based upon medical necessity as  noted below:    <    CHIEF COMPLAINT:  Altered mental status.    HISTORY OF PRESENT ILLNESS:  The patient is a 64 year old male with a past medical history of hepatitis C,  liver cirrhosis, esophageal varices, upper GI bleeding.  The patient recently  admitted on 03/03/2014 and discharged on 03/07/2014.  During his hospital  admission, patient was found to have liver cirrhosis, suspected to be  secondary to hepatitis C versus alcohol.  The patient was started on  parenteral Protonix, diuretics with Lasix and Aldactone and discharged home  with outpatient GI followup.  He came to the emergency room by his family  members with confusion and altered mental status.  The patient is not able to  provide any history.  There is no fever or chills, no abdominal pain.  No  chest pain or shortness of breathing.    PAST MEDICAL HISTORY:  Liver cirrhosis, upper GI bleeding, esophageal varices, hepatitis C.    PAST SURGICAL HISTORY:  Left leg surgery.    ALLERGIES:  NO KNOWN DRUG ALLERGIES.    HOME MEDICATIONS:  Aldactone 100 mg daily, alprazolam 1 mg daily, Protonix 40 mg daily, Lasix 40  mg daily, oxycodone 5 mg q.4 p.r.n., Bentyl 20 mg 4 times a day.    FAMILY HISTORY:  Noncontributory.    SOCIAL HISTORY:  No history of smoking.  He quit drinking.  No illicit drug use.    REVIEW OF SYSTEMS:  Unable to obtain because of his mental status.    PHYSICAL EXAMINATION:  GENERALLY:  The patient is not in acute distress.  VITAL SIGNS:  Blood pressure 122/82, pulse rate 74, respirations 19,  temperature 97.7, O2 sats 97% on room air.  HEENT:  Pupils are equal and reactive to light.  Anicteric sclerae.  NECK:  No JVD.  CARDIOVASCULAR SYSTEM:  Regular rate and rhythm, S1 and S2 well heard.  No   murmur or gallop.  LUNGS:  Clear to auscultation bilaterally.  ABDOMEN:  Soft, nontender, no distension, positive bowel sounds.  EXTREMITIES:  No edema or cyanosis.  SKIN:  No rash.  MUSCULOSKELETAL:  No joint swelling or tenderness.  NEUROLOGY:  No focal neurologic deficits.  The patient is lethargic.    LABORATORY DATA:  WBC 5.7, hemoglobin 14.2, platelets 70, ammonia 86.  Sodium 158, potassium  4.2, chloride 102, bicarbonate 29, BUN 16, creatinine 1.1.      Chest x-ray negative.    ASSESSMENT AND PLAN:  1.  Hepatitic encephalopathy.  2.  Liver cirrhosis.  3.  Recent upper gastrointestinal bleeding secondary to esophageal varices.  4.  Umbilical hernia, planning repair by surgery.    PLAN:  The patient is admitted to the telemetry floor.  Will start him with lactulose  at least until he has 2 to 3 bowel movements daily, monitor his labs, ammonia  and LFTs.  Will consult GI.  Avoid sedatives and narcotics, monitor his  hemoglobin and hematocrit.  Will continue to monitor and further  recommendations will follow.      ___________________  Theodis AguasYared G Jesilyn Easom MD  Dictated By: .   CDC  D:03/12/2014 04:54:[id number]:26:07  T: 03/12/2014 23:05:01  [id number]  Electronically Authenticated and Edited by:  Meta Hatchet Tenny Craw, M.D. On 03/26/2014 06:28 PM EST

## 2014-03-13 LAB — METABOLIC PANEL, COMPREHENSIVE
ALT (SGPT): 60 U/L (ref 12–78)
AST (SGOT): 67 U/L — ABNORMAL HIGH (ref 15–37)
Albumin: 3 gm/dl — ABNORMAL LOW (ref 3.4–5.0)
Alk. phosphatase: 85 U/L (ref 45–117)
BUN: 11 mg/dl (ref 7–25)
Bilirubin, total: 1.3 mg/dl — ABNORMAL HIGH (ref 0.2–1.0)
CO2: 22 mEq/L (ref 21–32)
Calcium: 7.8 mg/dl — ABNORMAL LOW (ref 8.5–10.1)
Chloride: 105 mEq/L (ref 98–107)
Creatinine: 0.8 mg/dl (ref 0.6–1.3)
GFR est AA: >60
GFR est non-AA: >60
Glucose: 176 mg/dl — ABNORMAL HIGH (ref 74–106)
Potassium: 3.5 mEq/L (ref 3.5–5.1)
Protein, total: 7.2 gm/dl (ref 6.4–8.2)
Sodium: 137 mEq/L (ref 136–145)

## 2014-03-13 LAB — CBC WITH AUTOMATED DIFF
BASOPHILS: 1.1 % (ref 0–3)
EOSINOPHILS: 0 % (ref 0–5)
HCT: 37.6 % (ref 37.0–50.0)
HGB: 13.1 gm/dl (ref 12.4–17.2)
IMMATURE GRANULOCYTES: 0.4 % (ref 0.0–3.0)
LYMPHOCYTES: 13.3 % — ABNORMAL LOW (ref 28–48)
MCH: 32.2 pg (ref 23.0–34.6)
MCHC: 34.8 gm/dl (ref 30.0–36.0)
MCV: 92.4 fL (ref 80.0–98.0)
MONOCYTES: 15 % — ABNORMAL HIGH (ref 1–13)
MPV: 12.6 fL — ABNORMAL HIGH (ref 6.0–10.0)
NEUTROPHILS: 70.2 % — ABNORMAL HIGH (ref 34–64)
NRBC: 0 (ref 0–0)
PLATELET: 60 10*3/uL — ABNORMAL LOW (ref 140–450)
RBC: 4.07 M/uL (ref 3.80–5.70)
RDW-SD: 47.7 — ABNORMAL HIGH (ref 35.1–43.9)
WBC: 5.4 10*3/uL (ref 4.0–11.0)

## 2014-03-13 LAB — AMMONIA: Ammonia, plasma: 44 umol/L — ABNORMAL HIGH (ref 11–32)

## 2014-03-13 LAB — PROTHROMBIN TIME + INR
INR: 1.3 — ABNORMAL HIGH (ref 0.0–1.1)
Prothrombin time: 16 seconds — ABNORMAL HIGH (ref 11.5–14.0)

## 2014-03-13 NOTE — ED Provider Notes (Addendum)
[facility]  EMERGENCY [facility]MENT TREATMENT REPORT  NAME:  Todd KnifeWILLIAMS, UtahRANDOLPH  SEX:   M  ADMIT: 03/12/2014  DOB:   [birth year]  MR#    [id number]  ROOM:  [id number]  TIME DICTATED: 10 53 PM  ACCT#  [id number]    cc: Jeannett SeniorSTEPHEN TO M.D.    I hereby certify this patient for admission based upon medical necessity as  noted below:      CHIEF COMPLAINT:  Altered mental status.    HISTORY OF PRESENT ILLNESS:  This is a 64 year old male who was sent over from Dr. Hollie Salko's office for altered  mental status.  The patient was recently discharged from the hospital related  to cirrhosis and massive ascites and vomiting.  This was just within the past  week.  He was sent home with oxycodone.  His friend and/or significant other  in the room, tells me that he has actually been taking up to 40 oxycodone at  this time.  There is no Tylenol in the products he has been taking and she  actually was 20 mg of oxycodone for her pain, and he has been taking her's on  top of his 5 mg oxycodone.  She states he has been confused and has not quite  been acting right.  This has been going on over the past 2 days or so.  He did  admit to Dr. To, although to me, was having a difficult time talking about the  history of recurrence of the vomiting and diarrhea.  He did tell me he had  black stools but could not tell me when those were.  He states his belly hurts  diffusely, but again his history is unreliable.  Unable to perform adequate  HPI and ROS within the constraints imposed by the patient's altered mental  status.      REVIEW OF SYSTEMS:  Limited secondary to patient's inability to provide any history.  CONSTITUTIONAL:  Denies any fevers or chills.  EYES:  No visual symptoms.   ENT:  No sore throat, runny nose, or other URI symptoms.   RESPIRATORY:  No cough, shortness of breath, or wheezing.   CARDIOVASCULAR:  No chest pain, chest pressure, or palpitations.   GI:  He states he continues to have belly pain, occasionally vomiting,   occasionally diarrhea, but cannot tell me the last episode of either.  Does  report black stools, but cannot tell me the last episode of that either.    NEUROLOGIC:  He has been altered. He has been rustling through drawers in the  middle of the night.  He has said things that do not make sense to his  significant other.    PAST MEDICAL HISTORY:  History of hep C with cirrhosis, end-stage liver disease, history of GI  bleeding with esophageal varices and peptic ulcer disease.    PAST SURGICAL HISTORY:  He has had leg surgery.    SOCIAL HISTORY:  Nonsmoker.  He used to drink daily, but states that he has not actually had  any alcohol since December.    ALLERGIES:  NO KNOWN DRUG ALLERGIES.    CURRENT MEDICATIONS:  Nadolol, oxycodone, pantoprazole.     PHYSICAL EXAMINATION:  VITAL SIGNS:  Blood pressure 131/91, pulse 84, respiration 18, temperature  97.9, O2 saturation 95% on room air.  CONSTITUTIONAL:  Well-developed, well-nourished male sitting upright on the  stretcher, in no acute distress.  HEENT:  Eyes:  Conjunctivae are clear, lids are normal.  Sclerae  are  anicteric.  Pupils equal, round and reactive to light.  ENT:  Posterior  oropharynx has moist mucous membranes, really unremarkable.   RESPIRATORY:  Clear and equal breath sounds.  No respiratory distress,  tachypnea, or accessory muscle use.     CARDIOVASCULAR:   Heart regular, without murmurs, gallops, rubs, or thrills.       GASTROINTESTINAL:  Abdomen is soft.  There is no significant tenderness on  exam.  His bowel sounds are active.  He does not have significant ascites  notable on exam.    SKIN:  Warm and dry without rashes.   NEUROLOGIC:  He is only alert to himself and his significant other, but cannot  tell me the year.  He tells me it is November; it is actually January.  He  cannot come up with the President.  He, with string testing, does not have any  confabulation, but he also has a very difficult time with the history, but no   difficulties with necessarily word finding and slurred speech.  His cranial  nerve testing II through XII is normal.  His strength in the upper extremities  is equal and symmetric.  Lower extremities equal and symmetric.    INITIAL ASSESSMENT AND MANAGEMENT PLAN:  A 64 year old male, I am concerned with hepatic encephalopathy.  Given his  recent activities, this could be worsened by his overuse and opiate abuse with  the oxycodone of his own and his significant other's.  He is not somnolent to  the point that I think that he would require Narcan.  He is sitting upright  and he is actually quite active.  I suspect this is more likely to be all  hepatic encephalopathy from narcotics and poor liver processing.  I did do a  rectal and a guaiac of his stools and it was yellowish stool and mucus which  was guaiac negative.  Chest x-ray is negative.  Basic labs were obtained,  chest x-ray and including ammonia.  Chest x-ray is negative.  CBC:  White  count 5.7, H&H are normal, platelet count is low at 70.  Ammonia is 86.  CMP  shows an AST of 80, ALT of 70.  T-bili is 1.2, lipase is 133.    EMERGENCY DEPARTMENT COURSE:    The patient was hooked up to blood pressure, cardiac and pulse oximetry  monitoring.  The patient was given lactulose 10 g orally, as I suspect this is  likely related to the ammonia.  I have discussed the case with Dr. Tenny Craw.  The patient will be admitted to [facility].      EMERGENCY DEPARTMENT DIAGNOSES:   1.  Acute hepatic encephalopathy.   2.  Opiate abuse.    PLAN:    As above.      ___________________  Lucio Edward MD  Dictated By: Marland Kitchen     My signature above authenticates this document and my orders, the final  diagnosis (es), discharge prescription (s), and instructions in the [facility] record.  Nursing notes have been reviewed by the physician/mid-level provider.    If you have any questions please contact [phone].    [place]  D:03/12/2014 22:53:23  T: 03/13/2014 82:95:62   [id number]  Electronically Authenticated by:  Lucio Edward, MD On 03/14/2014 06:38 PM EST

## 2014-03-14 LAB — CBC WITH AUTOMATED DIFF
BASOPHILS: 1.2 % (ref 0–3)
EOSINOPHILS: 0 % (ref 0–5)
HCT: 38.8 % (ref 37.0–50.0)
HGB: 13.1 gm/dl (ref 12.4–17.2)
IMMATURE GRANULOCYTES: 0.2 % (ref 0.0–3.0)
LYMPHOCYTES: 15.3 % — ABNORMAL LOW (ref 28–48)
MCH: 31.3 pg (ref 23.0–34.6)
MCHC: 33.8 gm/dl (ref 30.0–36.0)
MCV: 92.6 fL (ref 80.0–98.0)
MONOCYTES: 18.6 % — ABNORMAL HIGH (ref 1–13)
MPV: 13.2 fL — ABNORMAL HIGH (ref 6.0–10.0)
NEUTROPHILS: 64.7 % — ABNORMAL HIGH (ref 34–64)
NRBC: 0 (ref 0–0)
PLATELET: 52 10*3/uL — ABNORMAL LOW (ref 140–450)
RBC: 4.19 M/uL (ref 3.80–5.70)
RDW-SD: 47.6 — ABNORMAL HIGH (ref 35.1–43.9)
WBC: 4.2 10*3/uL (ref 4.0–11.0)

## 2014-03-14 LAB — METABOLIC PANEL, BASIC
BUN: 10 mg/dl (ref 7–25)
CO2: 27 mEq/L (ref 21–32)
Calcium: 7.8 mg/dl — ABNORMAL LOW (ref 8.5–10.1)
Chloride: 104 mEq/L (ref 98–107)
Creatinine: 0.7 mg/dl (ref 0.6–1.3)
GFR est AA: >60
GFR est non-AA: >60
Glucose: 81 mg/dl (ref 74–106)
Potassium: 4 mEq/L (ref 3.5–5.1)
Sodium: 138 mEq/L (ref 136–145)

## 2014-03-14 LAB — HEPATIC FUNCTION PANEL
ALT (SGPT): 64 U/L (ref 12–78)
AST (SGOT): 67 U/L — ABNORMAL HIGH (ref 15–37)
Albumin: 3.1 gm/dl — ABNORMAL LOW (ref 3.4–5.0)
Alk. phosphatase: 85 U/L (ref 45–117)
Bilirubin, direct: 0.4 mg/dl — ABNORMAL HIGH (ref 0.0–0.2)
Bilirubin, total: 1.2 mg/dl — ABNORMAL HIGH (ref 0.2–1.0)
Protein, total: 7.3 gm/dl (ref 6.4–8.2)

## 2014-03-15 LAB — CBC WITH AUTOMATED DIFF
BASOPHILS: 0.8 % (ref 0–3)
EOSINOPHILS: 0 % (ref 0–5)
HCT: 36.8 % — ABNORMAL LOW (ref 37.0–50.0)
HGB: 12.7 gm/dl (ref 12.4–17.2)
IMMATURE GRANULOCYTES: 0.3 % (ref 0.0–3.0)
LYMPHOCYTES: 16.4 % — ABNORMAL LOW (ref 28–48)
MCH: 31.8 pg (ref 23.0–34.6)
MCHC: 34.5 gm/dl (ref 30.0–36.0)
MCV: 92 fL (ref 80.0–98.0)
MONOCYTES: 17.1 % — ABNORMAL HIGH (ref 1–13)
MPV: 12.6 fL — ABNORMAL HIGH (ref 6.0–10.0)
NEUTROPHILS: 65.4 % — ABNORMAL HIGH (ref 34–64)
NRBC: 0 (ref 0–0)
PLATELET: 47 10*3/uL — CL (ref 140–450)
RBC: 4 M/uL (ref 3.80–5.70)
RDW-SD: 46.5 — ABNORMAL HIGH (ref 35.1–43.9)
WBC: 3.9 10*3/uL — ABNORMAL LOW (ref 4.0–11.0)

## 2014-03-15 LAB — METABOLIC PANEL, BASIC
BUN: 9 mg/dl (ref 7–25)
CO2: 25 mEq/L (ref 21–32)
Calcium: 7.5 mg/dl — ABNORMAL LOW (ref 8.5–10.1)
Chloride: 104 mEq/L (ref 98–107)
Creatinine: 0.7 mg/dl (ref 0.6–1.3)
GFR est AA: >60
GFR est non-AA: >60
Glucose: 98 mg/dl (ref 74–106)
Potassium: 4.1 mEq/L (ref 3.5–5.1)
Sodium: 138 mEq/L (ref 136–145)

## 2014-03-15 LAB — MAGNESIUM: Magnesium: 1.8 mg/dl (ref 1.8–2.4)

## 2014-03-15 LAB — PROTHROMBIN TIME + INR
INR: 1.4 — ABNORMAL HIGH (ref 0.0–1.1)
Prothrombin time: 16.7 seconds — ABNORMAL HIGH (ref 11.5–14.0)

## 2014-03-15 LAB — AMMONIA: Ammonia, plasma: 92 umol/L — ABNORMAL HIGH (ref 11–32)

## 2014-03-16 LAB — HEPATIC FUNCTION PANEL
ALT (SGPT): 62 U/L (ref 12–78)
AST (SGOT): 63 U/L — ABNORMAL HIGH (ref 15–37)
Albumin: 3.3 gm/dl — ABNORMAL LOW (ref 3.4–5.0)
Alk. phosphatase: 96 U/L (ref 45–117)
Bilirubin, direct: 0.4 mg/dl — ABNORMAL HIGH (ref 0.0–0.2)
Bilirubin, total: 0.9 mg/dl (ref 0.2–1.0)
Protein, total: 7.2 gm/dl (ref 6.4–8.2)

## 2014-03-16 LAB — AMMONIA: Ammonia, plasma: 79 umol/L — ABNORMAL HIGH (ref 11–32)

## 2014-03-17 LAB — METABOLIC PANEL, BASIC
BUN: 8 mg/dl (ref 7–25)
CO2: 27 mEq/L (ref 21–32)
Calcium: 7.7 mg/dl — ABNORMAL LOW (ref 8.5–10.1)
Chloride: 102 mEq/L (ref 98–107)
Creatinine: 0.7 mg/dl (ref 0.6–1.3)
GFR est AA: >60
GFR est non-AA: >60
Glucose: 170 mg/dl — ABNORMAL HIGH (ref 74–106)
Potassium: 4 mEq/L (ref 3.5–5.1)
Sodium: 138 mEq/L (ref 136–145)

## 2014-03-17 LAB — HEPATIC FUNCTION PANEL
ALT (SGPT): 55 U/L (ref 12–78)
AST (SGOT): 57 U/L — ABNORMAL HIGH (ref 15–37)
Albumin: 3 gm/dl — ABNORMAL LOW (ref 3.4–5.0)
Alk. phosphatase: 82 U/L (ref 45–117)
Bilirubin, direct: 0.4 mg/dl — ABNORMAL HIGH (ref 0.0–0.2)
Bilirubin, total: 0.8 mg/dl (ref 0.2–1.0)
Protein, total: 6.7 gm/dl (ref 6.4–8.2)

## 2014-03-17 LAB — CBC WITH AUTOMATED DIFF
BASOPHILS: 0.9 % (ref 0–3)
EOSINOPHILS: 0 % (ref 0–5)
HCT: 34.6 % — ABNORMAL LOW (ref 37.0–50.0)
HGB: 11.7 gm/dl — ABNORMAL LOW (ref 12.4–17.2)
IMMATURE GRANULOCYTES: 0.3 % (ref 0.0–3.0)
LYMPHOCYTES: 16.8 % — ABNORMAL LOW (ref 28–48)
MCH: 31.8 pg (ref 23.0–34.6)
MCHC: 33.8 gm/dl (ref 30.0–36.0)
MCV: 94 fL (ref 80.0–98.0)
MONOCYTES: 17.7 % — ABNORMAL HIGH (ref 1–13)
MPV: 13.7 fL — ABNORMAL HIGH (ref 6.0–10.0)
NEUTROPHILS: 64.3 % — ABNORMAL HIGH (ref 34–64)
NRBC: 0 (ref 0–0)
PLATELET: 35 10*3/uL — CL (ref 140–450)
RBC: 3.68 M/uL — ABNORMAL LOW (ref 3.80–5.70)
RDW-SD: 47.1 — ABNORMAL HIGH (ref 35.1–43.9)
WBC: 3.3 10*3/uL — ABNORMAL LOW (ref 4.0–11.0)

## 2014-03-17 LAB — MAGNESIUM: Magnesium: 1.7 mg/dl — ABNORMAL LOW (ref 1.8–2.4)

## 2014-03-17 LAB — AMMONIA: Ammonia, plasma: 90 umol/L — ABNORMAL HIGH (ref 11–32)

## 2014-03-19 LAB — AFP, TUMOR MARKER
AFP, TUMOR MARKER, AFP: 4.1 ng/mL (ref <6.1)
AFP, Tumor marker: 4.1 ng/mL (ref <6.1)

## 2014-04-21 LAB — CULTURE AFB AND SMEAR

## 2014-04-22 ENCOUNTER — Inpatient Hospital Stay
Admit: 2014-04-22 | Discharge: 2014-04-22 | Disposition: A | Discharge status: Home / Self Care | Payer: Self-pay | Attending: Emergency Medicine

## 2014-04-22 DIAGNOSIS — R1013 Epigastric pain: Secondary | ICD-10-CM

## 2014-04-22 LAB — METABOLIC PANEL, COMPREHENSIVE
A-G Ratio: 0.7 — ABNORMAL LOW (ref 0.8–1.7)
ALT (SGPT): 75 U/L — ABNORMAL HIGH (ref 16–61)
AST (SGOT): 71 U/L — ABNORMAL HIGH (ref 15–37)
Albumin: 3.2 g/dL — ABNORMAL LOW (ref 3.4–5.0)
Alk. phosphatase: 112 U/L (ref 45–117)
Anion gap: 7 mmol/L (ref 3.0–18)
BUN/Creatinine ratio: 13 (ref 12–20)
BUN: 11 MG/DL (ref 7.0–18)
Bilirubin, total: 1.1 MG/DL — ABNORMAL HIGH (ref 0.2–1.0)
CO2: 30 mmol/L (ref 21–32)
Calcium: 8.2 MG/DL — ABNORMAL LOW (ref 8.5–10.1)
Chloride: 102 mmol/L (ref 100–108)
Creatinine: 0.83 MG/DL (ref 0.6–1.3)
GFR est AA: >60 mL/min/{1.73_m2} (ref >60)
GFR est non-AA: >60 mL/min/{1.73_m2} (ref >60)
Globulin: 4.6 g/dL — ABNORMAL HIGH (ref 2.0–4.0)
Glucose: 168 mg/dL — ABNORMAL HIGH (ref 74–99)
Potassium: 3.8 mmol/L (ref 3.5–5.5)
Protein, total: 7.8 g/dL (ref 6.4–8.2)
Sodium: 139 mmol/L (ref 136–145)

## 2014-04-22 LAB — CBC WITH AUTOMATED DIFF
ABS. BASOPHILS: 0 10*3/uL (ref 0.0–0.06)
ABS. EOSINOPHILS: 0.1 10*3/uL (ref 0.0–0.4)
ABS. LYMPHOCYTES: 0.7 10*3/uL — ABNORMAL LOW (ref 0.9–3.6)
ABS. MONOCYTES: 1 10*3/uL (ref 0.05–1.2)
ABS. NEUTROPHILS: 4 10*3/uL (ref 1.8–8.0)
BASOPHILS: 1 % (ref 0–2)
EOSINOPHILS: 2 % (ref 0–5)
HCT: 41.4 % (ref 36.0–48.0)
HGB: 13.9 g/dL (ref 13.0–16.0)
LYMPHOCYTES: 11 % — ABNORMAL LOW (ref 21–52)
MCH: 30.8 PG (ref 24.0–34.0)
MCHC: 33.6 g/dL (ref 31.0–37.0)
MCV: 91.6 FL (ref 74.0–97.0)
MONOCYTES: 17 % — ABNORMAL HIGH (ref 3–10)
MPV: 11.4 FL (ref 9.2–11.8)
NEUTROPHILS: 69 % (ref 40–73)
PLATELET: 56 10*3/uL — ABNORMAL LOW (ref 135–420)
RBC: 4.52 M/uL — ABNORMAL LOW (ref 4.70–5.50)
RDW: 15.1 % — ABNORMAL HIGH (ref 11.6–14.5)
WBC: 5.8 10*3/uL (ref 4.6–13.2)

## 2014-04-22 LAB — LIPASE: Lipase: 646 U/L — ABNORMAL HIGH (ref 73–393)

## 2014-04-22 LAB — AMMONIA: Ammonia, plasma: 25 umol/L (ref 11–32)

## 2014-04-22 MED ORDER — OXYCODONE-ACETAMINOPHEN 5 MG-325 MG TAB
5-325 mg | ORAL_TABLET | ORAL | Status: DC
Start: 2014-04-22 — End: 2014-05-04

## 2014-04-22 MED ORDER — ONDANSETRON (PF) 4 MG/2 ML INJECTION
4 mg/2 mL | INTRAMUSCULAR | Status: AC
Start: 2014-04-22 — End: 2014-04-22
  Administered 2014-04-22: 16:00:00 via INTRAVENOUS

## 2014-04-22 MED ORDER — HYDROMORPHONE (PF) 1 MG/ML IJ SOLN
1 mg/mL | Freq: Once | INTRAMUSCULAR | Status: AC
Start: 2014-04-22 — End: 2014-04-22
  Administered 2014-04-22: 16:00:00 via INTRAVENOUS

## 2014-04-22 MED ORDER — HYDROMORPHONE (PF) 1 MG/ML IJ SOLN
1 mg/mL | Freq: Once | INTRAMUSCULAR | Status: AC
Start: 2014-04-22 — End: 2014-04-22
  Administered 2014-04-22: 19:00:00 via INTRAVENOUS

## 2014-04-22 MED FILL — HYDROMORPHONE (PF) 1 MG/ML IJ SOLN: 1 mg/mL | INTRAMUSCULAR | Qty: 1

## 2014-04-22 MED FILL — ONDANSETRON HCL 4 MG/2 ML INTRAVENOUS SYRINGE: 4 mg/2 mL | INTRAVENOUS | Qty: 2

## 2014-04-22 NOTE — ED Notes (Signed)
I have reviewed discharge instructions with the patient. Prescriptions x 1 were reviewed with patient instructed not to drink alcohol, drive a car, or operate heavy machinery while taking this medicine. The patient verbalized understanding. Patient seen leaving ED ambulatory without difficulty or need for assistance, with family member, in no sign of distress. Patient armband removed and shredded

## 2014-04-22 NOTE — ED Provider Notes (Addendum)
HPI Comments:   10:10 AM: [name] is a 64 y.o. male with hx of liver cirrhosis and chronic hepatitis C who presents to the Emergency Department complaining of abd pain that started 3 days ago. He is c/o associated nausea. He says he has had similar episodes before, and he is out of home pain medications. He says the last time he felt this bad, his ammonia level was high. No other associated symptoms or modifying factors at this time.    PCP: Nolene Bernheim TO, MD      Patient is a 64 y.o. male presenting with abdominal pain and diarrhea. The history is provided by the patient.   Abdominal Pain   Associated symptoms include diarrhea and nausea. Pertinent negatives include no fever.   Diarrhea   Associated symptoms include diarrhea and nausea. Pertinent negatives include no fever.        Past Medical History:   Diagnosis Date   ??? Hypertension    ??? Ill-defined condition      anemia   ??? Anemia    ??? Varices    ??? Hepatitis C virus    ??? GI bleed    ??? Alcohol abuse    ??? Hernia        Past Surgical History:   Procedure Laterality Date   ??? Upper gi endoscopy,ligat varix  01/20/2013               Family History:   Problem Relation Age of Onset   ??? Heart Disease Father 74     MI   ??? Heart Disease Brother        History     Social History   ??? Marital Status: SINGLE     Spouse Name: N/A   ??? Number of Children: N/A   ??? Years of Education: N/A     Occupational History   ??? Not on file.     Social History Main Topics   ??? Smoking status: Former Smoker   ??? Smokeless tobacco: Never Used   ??? Alcohol Use: No      Comment: quit drinking December 2014   ??? Drug Use: No   ??? Sexual Activity: Not on file     Other Topics Concern   ??? Not on file     Social History Narrative           ALLERGIES: Review of patient's allergies indicates no known allergies.      Review of Systems   Constitutional: Negative for fever.   Respiratory: Negative for cough and shortness of breath.     Gastrointestinal: Positive for nausea, abdominal pain and diarrhea.   All other systems reviewed and are negative.      Filed Vitals:    04/22/14 1142 04/22/14 1204 04/22/14 1208 04/22/14 1220   BP:   134/78    Pulse:       Temp:       Resp:       Height:       Weight:       SpO2: 98% 97% 98% 97%            Physical Exam   Constitutional: He is oriented to person, place, and time. He appears well-developed and well-nourished.   HENT:   Head: Normocephalic and atraumatic.   Mouth/Throat: Oropharynx is clear and moist.   Eyes: EOM are normal. Pupils are equal, round, and reactive to light.   Neck: Neck supple.   Cardiovascular:  Normal rate and regular rhythm.    Pulmonary/Chest: Effort normal and breath sounds normal.   Abdominal: Soft. He exhibits distension. There is tenderness. There is no rebound and no guarding.   Hepatomegly, no fluid wave   Musculoskeletal: Normal range of motion.   Neurological: He is alert and oriented to person, place, and time.   Skin: Skin is warm and dry.   Psychiatric: He has a normal mood and affect.   Nursing note and vitals reviewed.       MDM  Number of Diagnoses or Management Options  Diagnosis management comments: Pt feeling better after meds, wants to go home, agrees with dispo and F/U plan.  Chelsea Aus, MD  1:28 PM         Amount and/or Complexity of Data Reviewed  Clinical lab tests: ordered and reviewed  Review and summarize past medical records: yes    Provider Attestation:   I personally performed the services described in the documentation, reviewed the documentation, as recorded by the scribe in my presence, and it accurately and completely records my words and actions. April 22, 2014 at 1:28 PM - Chelsea Aus, MD        Procedures      Vitals:  Patient Vitals for the past 12 hrs:   Temp Pulse Resp BP SpO2   04/22/14 1220 - - - - 97 %   04/22/14 1208 - - - 134/78 mmHg 98 %   04/22/14 1204 - - - - 97 %   04/22/14 1142 - - - - 98 %   04/22/14 1120 - - - - 98 %    04/22/14 1104 - - - - 97 %   04/22/14 0854 97.9 ??F (36.6 ??C) 99 18 (!) 148/98 mmHg 96 %       Medications ordered:   Medications   HYDROmorphone (PF) (DILAUDID) injection 1 mg (1 mg IntraVENous Given 04/22/14 1053)   ondansetron (ZOFRAN) injection 4 mg (4 mg IntraVENous Given 04/22/14 1052)         Lab findings:  Recent Results (from the past 12 hour(s))   CBC WITH AUTOMATED DIFF    Collection Time: 04/22/14 10:45 AM   Result Value Ref Range    WBC 5.8 4.6 - 13.2 K/uL    RBC 4.52 (L) 4.70 - 5.50 M/uL    HGB 13.9 13.0 - 16.0 g/dL    HCT 41.4 36.0 - 48.0 %    MCV 91.6 74.0 - 97.0 FL    MCH 30.8 24.0 - 34.0 PG    MCHC 33.6 31.0 - 37.0 g/dL    RDW 15.1 (H) 11.6 - 14.5 %    PLATELET 56 (L) 135 - 420 K/uL    MPV 11.4 9.2 - 11.8 FL    NEUTROPHILS 69 40 - 73 %    LYMPHOCYTES 11 (L) 21 - 52 %    MONOCYTES 17 (H) 3 - 10 %    EOSINOPHILS 2 0 - 5 %    BASOPHILS 1 0 - 2 %    ABS. NEUTROPHILS 4.0 1.8 - 8.0 K/UL    ABS. LYMPHOCYTES 0.7 (L) 0.9 - 3.6 K/UL    ABS. MONOCYTES 1.0 0.05 - 1.2 K/UL    ABS. EOSINOPHILS 0.1 0.0 - 0.4 K/UL    ABS. BASOPHILS 0.0 0.0 - 0.06 K/UL    DF AUTOMATED     METABOLIC PANEL, COMPREHENSIVE    Collection Time: 04/22/14 10:45 AM   Result Value Ref Range  Sodium 139 136 - 145 mmol/L    Potassium 3.8 3.5 - 5.5 mmol/L    Chloride 102 100 - 108 mmol/L    CO2 30 21 - 32 mmol/L    Anion gap 7 3.0 - 18 mmol/L    Glucose 168 (H) 74 - 99 mg/dL    BUN 11 7.0 - 18 MG/DL    Creatinine 0.83 0.6 - 1.3 MG/DL    BUN/Creatinine ratio 13 12 - 20      GFR est AA >60 >60 ml/min/1.5m    GFR est non-AA >60 >60 ml/min/1.757m   Calcium 8.2 (L) 8.5 - 10.1 MG/DL    Bilirubin, total 1.1 (H) 0.2 - 1.0 MG/DL    ALT 75 (H) 16 - 61 U/L    AST 71 (H) 15 - 37 U/L    Alk. phosphatase 112 45 - 117 U/L    Protein, total 7.8 6.4 - 8.2 g/dL    Albumin 3.2 (L) 3.4 - 5.0 g/dL    Globulin 4.6 (H) 2.0 - 4.0 g/dL    A-G Ratio 0.7 (L) 0.8 - 1.7     LIPASE    Collection Time: 04/22/14 10:45 AM   Result Value Ref Range     Lipase 646 (H) 73 - 393 U/L   AMMONIA    Collection Time: 04/22/14 10:45 AM   Result Value Ref Range    Ammonia 25 11 - 32 UMOL/L       X-Ray, CT or other radiology findings or impressions:  No orders to display     Progress Notes:   1:25 PM: Rechecked patient. Updated patient on all ED findings and plan for discharge. The patient verbalized understanding and agreement with the plan. All questions answered.      Reevaluation of patient:   I have reevaluated the patient. Patient is feeling improved. Patient discharged in stable condition.    Disposition:  Diagnosis: No diagnosis found.    Disposition: Discharged    Follow-up Information     None           Patient's Medications   Start Taking    No medications on file   Continue Taking    FERROUS SULFATE 325 MG (65 MG IRON) TABLET    Take 1 tablet by mouth Daily (before breakfast).    FOLIC ACID (FOLVITE) 1 MG TABLET    Take 1 tablet by mouth daily.    MULTIVITAMIN CAPSULE    Take 1 capsule by mouth daily.    NADOLOL (CORGARD) 20 MG TABLET    Take 1 tablet by mouth daily.    PANTOPRAZOLE (PROTONIX) 40 MG TABLET    Take 1 tablet by mouth daily.    SPIRONOLACTONE (ALDACTONE) 100 MG TABLET    Take 1 tablet by mouth daily.    THIAMINE (B-1) 100 MG TABLET    Take 1 tablet by mouth daily.   These Medications have changed    No medications on file   Stop Taking    No medications on file         Scribe Attestation  I, JaWestley Footsam scribing for, and in the presence of Dr. CaChelsea AusMD 10:10 AM, 04/22/2014.    Physician Attestation  I personally performed the services described in the documentation, reviewed the documentation, as recorded by the scribe in my presence, and it accurately and completely records my words and actions.    CaChelsea AusMD

## 2014-05-04 ENCOUNTER — Emergency Department: Admit: 2014-05-04 | Payer: Self-pay | Primary: Family Medicine

## 2014-05-04 ENCOUNTER — Inpatient Hospital Stay
Admit: 2014-05-04 | Discharge: 2014-05-04 | Disposition: A | Discharge status: Home / Self Care | Payer: Self-pay | Attending: Emergency Medicine

## 2014-05-04 DIAGNOSIS — K7031 Alcoholic cirrhosis of liver with ascites: Secondary | ICD-10-CM

## 2014-05-04 LAB — CBC WITH AUTOMATED DIFF
ABS. BASOPHILS: 0 10*3/uL (ref 0.0–0.06)
ABS. EOSINOPHILS: 0 10*3/uL (ref 0.0–0.4)
ABS. LYMPHOCYTES: 0.5 10*3/uL — ABNORMAL LOW (ref 0.9–3.6)
ABS. MONOCYTES: 0.7 10*3/uL (ref 0.05–1.2)
ABS. NEUTROPHILS: 2.4 10*3/uL (ref 1.8–8.0)
BASOPHILS: 0 % (ref 0–2)
EOSINOPHILS: 0 % (ref 0–5)
HCT: 34 % — ABNORMAL LOW (ref 36.0–48.0)
HGB: 11.4 g/dL — ABNORMAL LOW (ref 13.0–16.0)
LYMPHOCYTES: 14 % — ABNORMAL LOW (ref 21–52)
MCH: 31.3 PG (ref 24.0–34.0)
MCHC: 33.5 g/dL (ref 31.0–37.0)
MCV: 93.4 FL (ref 74.0–97.0)
MONOCYTES: 19 % — ABNORMAL HIGH (ref 3–10)
MPV: 10.6 FL (ref 9.2–11.8)
NEUTROPHILS: 67 % (ref 40–73)
PLATELET: 44 10*3/uL — ABNORMAL LOW (ref 135–420)
RBC: 3.64 M/uL — ABNORMAL LOW (ref 4.70–5.50)
RDW: 15.4 % — ABNORMAL HIGH (ref 11.6–14.5)
WBC: 3.7 10*3/uL — ABNORMAL LOW (ref 4.6–13.2)

## 2014-05-04 LAB — AMMONIA: Ammonia, plasma: 20 umol/L (ref 11–32)

## 2014-05-04 LAB — METABOLIC PANEL, COMPREHENSIVE
A-G Ratio: 0.8 (ref 0.8–1.7)
ALT (SGPT): 78 U/L — ABNORMAL HIGH (ref 16–61)
AST (SGOT): 92 U/L — ABNORMAL HIGH (ref 15–37)
Albumin: 3 g/dL — ABNORMAL LOW (ref 3.4–5.0)
Alk. phosphatase: 112 U/L (ref 45–117)
Anion gap: 6 mmol/L (ref 3.0–18)
BUN/Creatinine ratio: 11 — ABNORMAL LOW (ref 12–20)
BUN: 9 MG/DL (ref 7.0–18)
Bilirubin, total: 1.2 MG/DL — ABNORMAL HIGH (ref 0.2–1.0)
CO2: 31 mmol/L (ref 21–32)
Calcium: 7.4 MG/DL — ABNORMAL LOW (ref 8.5–10.1)
Chloride: 102 mmol/L (ref 100–108)
Creatinine: 0.79 MG/DL (ref 0.6–1.3)
GFR est AA: >60 mL/min/{1.73_m2} (ref >60)
GFR est non-AA: >60 mL/min/{1.73_m2} (ref >60)
Globulin: 4 g/dL (ref 2.0–4.0)
Glucose: 95 mg/dL (ref 74–99)
Potassium: 4.2 mmol/L (ref 3.5–5.5)
Protein, total: 7 g/dL (ref 6.4–8.2)
Sodium: 139 mmol/L (ref 136–145)

## 2014-05-04 LAB — NT-PRO BNP: NT pro-BNP: 99 PG/ML (ref 0–900)

## 2014-05-04 LAB — CARDIAC PANEL,(CK, CKMB & TROPONIN)
CK - MB: 6.1 ng/ml — ABNORMAL HIGH (ref 0.5–3.6)
CK-MB Index: 1.9 % (ref 0.0–4.0)
CK: 322 U/L — ABNORMAL HIGH (ref 39–308)
Troponin-I, QT: <0.02 NG/ML (ref 0.0–0.045)

## 2014-05-04 LAB — PROTHROMBIN TIME + INR
INR: 1.2 (ref 0.8–1.2)
Prothrombin time: 14.9 s (ref 11.5–15.2)

## 2014-05-04 LAB — PTT: aPTT: 33.7 s (ref 24.6–37.7)

## 2014-05-04 LAB — LIPASE: Lipase: 99 U/L (ref 73–393)

## 2014-05-04 LAB — MAGNESIUM: Magnesium: 1.7 mg/dL — ABNORMAL LOW (ref 1.8–2.4)

## 2014-05-04 MED ORDER — NADOLOL 20 MG TAB
20 mg | ORAL_TABLET | Freq: Every day | ORAL | Status: DC
Start: 2014-05-04 — End: 2014-05-20

## 2014-05-04 MED ORDER — RIFAXIMIN 550 MG TAB
550 mg | ORAL_TABLET | Freq: Every day | ORAL | Status: DC
Start: 2014-05-04 — End: 2014-05-20

## 2014-05-04 MED ORDER — HYDROMORPHONE (PF) 1 MG/ML IJ SOLN
1 mg/mL | Freq: Once | INTRAMUSCULAR | Status: AC
Start: 2014-05-04 — End: 2014-05-04
  Administered 2014-05-04: 23:00:00 via INTRAVENOUS

## 2014-05-04 MED ORDER — FUROSEMIDE 40 MG TAB
40 mg | ORAL_TABLET | Freq: Every day | ORAL | Status: DC
Start: 2014-05-04 — End: 2014-05-20

## 2014-05-04 MED ORDER — SPIRONOLACTONE 100 MG TAB
100 mg | ORAL_TABLET | Freq: Every day | ORAL | Status: DC
Start: 2014-05-04 — End: 2014-05-20

## 2014-05-04 MED ORDER — HYDROMORPHONE (PF) 1 MG/ML IJ SOLN
1 mg/mL | Freq: Once | INTRAMUSCULAR | Status: AC
Start: 2014-05-04 — End: 2014-05-04
  Administered 2014-05-04: 20:00:00 via INTRAVENOUS

## 2014-05-04 MED ORDER — PANTOPRAZOLE 40 MG TAB, DELAYED RELEASE
40 mg | ORAL_TABLET | Freq: Every day | ORAL | Status: DC
Start: 2014-05-04 — End: 2014-05-20

## 2014-05-04 MED ORDER — OXYCODONE 5 MG TAB
5 mg | ORAL_TABLET | Freq: Three times a day (TID) | ORAL | Status: AC | PRN
Start: 2014-05-04 — End: 2014-05-14

## 2014-05-04 MED FILL — HYDROMORPHONE (PF) 1 MG/ML IJ SOLN: 1 mg/mL | INTRAMUSCULAR | Qty: 1

## 2014-05-04 NOTE — ED Provider Notes (Signed)
HPI Comments: 3:54 PM [name] is a 64 y.o. male with alcoholic and hep C cirrhosis who presents to the ED c/o abdominal pain onset 3 days ago. Pt explains that he feels bloated in his stomach, and that his legs are very swollen. He states that he tries to keep his legs elevated at home to relieve some of the pain. Pt states that he is followed by Dr. Daivd Wallace of GI and still has some Lactulose for his sx, but states that he is out of all of his other medications. Pt reports that he does not currently have a PCP. Associated sx include SOB when he gets up and walks around, but denies SOB at rest. Denies fever, chills, bleeding, vomiting, or difficulty urinating. Patient denies having a history of blood clots. No other concerns at this time.         Patient is a 64 y.o. male presenting with abdominal pain, lower extremity edema, shortness of breath, and other event. The history is provided by the patient.   Abdominal Pain   Pertinent negatives include no fever, no diarrhea, no nausea, no vomiting, no dysuria, no hematuria, no headaches, no arthralgias, no myalgias and no chest pain.   Leg Swelling  Associated symptoms include abdominal pain and shortness of breath. Pertinent negatives include no chest pain and no headaches.   Shortness of Breath  Associated symptoms include abdominal pain and leg swelling. Pertinent negatives include no fever, no headaches, no rhinorrhea, no sore throat, no cough, no chest pain, no vomiting and no rash.   Other  Associated symptoms include abdominal pain and shortness of breath. Pertinent negatives include no chest pain and no headaches.        Past Medical History:   Diagnosis Date   ??? Hypertension    ??? Ill-defined condition      anemia   ??? Anemia    ??? Varices    ??? Hepatitis C virus    ??? GI bleed    ??? Alcohol abuse    ??? Hernia        Past Surgical History:   Procedure Laterality Date   ??? Upper gi endoscopy,ligat varix  01/20/2013               Family History:    Problem Relation Age of Onset   ??? Heart Disease Father 16     MI   ??? Heart Disease Brother        History     Social History   ??? Marital Status: SINGLE     Spouse Name: N/A   ??? Number of Children: N/A   ??? Years of Education: N/A     Occupational History   ??? Not on file.     Social History Main Topics   ??? Smoking status: Former Smoker   ??? Smokeless tobacco: Never Used   ??? Alcohol Use: No      Comment: quit drinking December 2014   ??? Drug Use: No   ??? Sexual Activity: Not on file     Other Topics Concern   ??? Not on file     Social History Narrative           ALLERGIES: Review of patient's allergies indicates no known allergies.      Review of Systems   Constitutional: Negative for fever, chills and fatigue.   HENT: Negative for congestion, rhinorrhea and sore throat.    Respiratory: Positive for shortness of breath. Negative for  cough.    Cardiovascular: Positive for leg swelling. Negative for chest pain and palpitations.   Gastrointestinal: Positive for abdominal pain. Negative for nausea, vomiting and diarrhea.   Genitourinary: Negative for dysuria, urgency and hematuria.   Musculoskeletal: Negative for myalgias and arthralgias.   Skin: Negative for rash and wound.   Neurological: Negative for dizziness and headaches.   All other systems reviewed and are negative.      Filed Vitals:    05/04/14 1700 05/04/14 1730 05/04/14 1900 05/04/14 1937   BP: 119/69 112/67 105/62 119/75   Pulse: 80 82 88 85   Temp:    98.7 ??F (37.1 ??C)   Resp: 13 13 17 14    Height:       Weight:       SpO2: 96% 94% 97% 96%            Physical Exam   Constitutional: He is oriented to person, place, and time. He appears well-developed and well-nourished. No distress.   HENT:   Head: Normocephalic and atraumatic.   Eyes: Conjunctivae and EOM are normal. Pupils are equal, round, and reactive to light. No scleral icterus.   Neck: Neck supple. No JVD present. No thyromegaly present.    Cardiovascular: Normal rate, regular rhythm, S1 normal and S2 normal.  Exam reveals no gallop and no friction rub.    No murmur heard.  Pulmonary/Chest: Effort normal and breath sounds normal. No accessory muscle usage. No respiratory distress.   Abdominal: Soft. Normal appearance. He exhibits no distension. There is tenderness. There is no rigidity, no rebound and no guarding.   The patient has a distended abdomen with positive fluid wave, and minimal diffuse tenderness with palpation. No peritoneal signs. Small, easily reducible umbilical hernia.   Musculoskeletal: Normal range of motion. He exhibits edema. He exhibits no tenderness.   Patient has bilateral lower extremity pitting edema , symmetrical bilaterally.   Neurological: He is alert and oriented to person, place, and time. He has normal strength. No cranial nerve deficit or sensory deficit. Coordination normal.   Skin: Skin is warm and intact. No rash noted.   Psychiatric: He has a normal mood and affect. His speech is normal and behavior is normal.   Vitals reviewed.       MDM  Number of Diagnoses or Management Options  Ascites due to alcoholic cirrhosis [facility]):   Bilateral leg edema:   Thrombocytopenia [facility]):   Diagnosis management comments: [name] is a 64 y.o. Male coming in with worsening ascites and leg swelling, off all his meds including lasix and aldactone. Patient afebrile with a normal WBC and I have an extremely low suspicion of SBP. Patient does not have evidence of CHF or respiratory distress. No clinical concern for PE. Symptoms likely due to medication non-compliance. Spoke to Dr. Judie Wallace and made aware of the situation. He agreed with not doing a diagnostic paracentesis especially given his thrombocytopenia. Looked up home meds and doses and I will refill. Will try to get in to clinic to see Dr. Daivd Wallace as soon as possible.      Procedures    Vitals:  Patient Vitals for the past 12 hrs:   Temp Pulse Resp BP SpO2    05/04/14 1937 98.7 ??F (37.1 ??C) 85 14 119/75 mmHg 96 %   05/04/14 1900 - 88 17 105/62 mmHg 97 %   05/04/14 1730 - 82 13 112/67 mmHg 94 %   05/04/14 1700 - 80 13 119/69 mmHg 96 %  05/04/14 1630 - 82 12 123/79 mmHg 97 %   05/04/14 1600 - 88 14 137/86 mmHg 98 %   05/04/14 1530 - 80 15 119/78 mmHg 97 %   05/04/14 1345 98.4 ??F (36.9 ??C) 81 18 137/82 mmHg 95 %       Medications ordered:   Medications   HYDROmorphone (PF) (DILAUDID) injection 1 mg (1 mg IntraVENous Given 05/04/14 1616)   HYDROmorphone (PF) (DILAUDID) injection 1 mg (1 mg IntraVENous Given 05/04/14 1915)         Lab findings:  Recent Results (from the past 12 hour(s))   EKG, 12 LEAD, INITIAL    Collection Time: 05/04/14  1:54 PM   Result Value Ref Range    Ventricular Rate 81 BPM    Atrial Rate 81 BPM    P-R Interval 120 ms    QRS Duration 82 ms    Q-T Interval 406 ms    QTC Calculation (Bezet) 471 ms    Calculated P Axis 9 degrees    Calculated R Axis 7 degrees    Calculated T Axis 14 degrees    Diagnosis       Normal sinus rhythm  Normal ECG  When compared with ECG of 25-Feb-2013 14:24,  Nonspecific T wave abnormality now evident in Inferior leads     CBC WITH AUTOMATED DIFF    Collection Time: 05/04/14  2:15 PM   Result Value Ref Range    WBC 3.7 (L) 4.6 - 13.2 K/uL    RBC 3.64 (L) 4.70 - 5.50 M/uL    HGB 11.4 (L) 13.0 - 16.0 g/dL    HCT 34.0 (L) 36.0 - 48.0 %    MCV 93.4 74.0 - 97.0 FL    MCH 31.3 24.0 - 34.0 PG    MCHC 33.5 31.0 - 37.0 g/dL    RDW 15.4 (H) 11.6 - 14.5 %    PLATELET 44 (L) 135 - 420 K/uL    MPV 10.6 9.2 - 11.8 FL    NEUTROPHILS 67 40 - 73 %    LYMPHOCYTES 14 (L) 21 - 52 %    MONOCYTES 19 (H) 3 - 10 %    EOSINOPHILS 0 0 - 5 %    BASOPHILS 0 0 - 2 %    ABS. NEUTROPHILS 2.4 1.8 - 8.0 K/UL    ABS. LYMPHOCYTES 0.5 (L) 0.9 - 3.6 K/UL    ABS. MONOCYTES 0.7 0.05 - 1.2 K/UL    ABS. EOSINOPHILS 0.0 0.0 - 0.4 K/UL    ABS. BASOPHILS 0.0 0.0 - 0.06 K/UL    DF AUTOMATED     METABOLIC PANEL, COMPREHENSIVE    Collection Time: 05/04/14  2:15 PM    Result Value Ref Range    Sodium 139 136 - 145 mmol/L    Potassium 4.2 3.5 - 5.5 mmol/L    Chloride 102 100 - 108 mmol/L    CO2 31 21 - 32 mmol/L    Anion gap 6 3.0 - 18 mmol/L    Glucose 95 74 - 99 mg/dL    BUN 9 7.0 - 18 MG/DL    Creatinine 0.79 0.6 - 1.3 MG/DL    BUN/Creatinine ratio 11 (L) 12 - 20      GFR est AA >60 >60 ml/min/1.14m    GFR est non-AA >60 >60 ml/min/1.745m   Calcium 7.4 (L) 8.5 - 10.1 MG/DL    Bilirubin, total 1.2 (H) 0.2 - 1.0 MG/DL    ALT 78 (H) 16 -  61 U/L    AST 92 (H) 15 - 37 U/L    Alk. phosphatase 112 45 - 117 U/L    Protein, total 7.0 6.4 - 8.2 g/dL    Albumin 3.0 (L) 3.4 - 5.0 g/dL    Globulin 4.0 2.0 - 4.0 g/dL    A-G Ratio 0.8 0.8 - 1.7     LIPASE    Collection Time: 05/04/14  2:15 PM   Result Value Ref Range    Lipase 99 73 - 393 U/L   PRO-BNP    Collection Time: 05/04/14  2:15 PM   Result Value Ref Range    NT pro-BNP 99 0 - 900 PG/ML   CARDIAC PANEL,(CK, CKMB & TROPONIN)    Collection Time: 05/04/14  2:15 PM   Result Value Ref Range    CK 322 (H) 39 - 308 U/L    CK - MB 6.1 (H) 0.5 - 3.6 ng/ml    CK-MB Index 1.9 0.0 - 4.0 %    Troponin-I, Qt. <0.02 0.0 - 0.045 NG/ML   AMMONIA    Collection Time: 05/04/14  4:22 PM   Result Value Ref Range    Ammonia 20 11 - 32 UMOL/L   MAGNESIUM    Collection Time: 05/04/14  4:22 PM   Result Value Ref Range    Magnesium 1.7 (L) 1.8 - 2.4 mg/dL       EKG interpretation by ED Physician:  1:56 PM  NSR rate of 81  No ischemic changes     EKG Interpreted by Evelena Leyden, MD    X-Ray, CT or other radiology findings or impressions:  XR CHEST PA LAT   Preliminary Result          Progress notes, Consult notes or additional Procedure notes:   Consult:  Discussed care with Dr.Iyer, Specialty: Gastroenterology. Standard discussion; including history of patient???s chief complaint, available diagnostic results, and treatment course. Dr. Judie Wallace looked up the patient's GI notes and will try to have the patient follow up as an  out-patient. Dr. Judie Wallace agreed against paracentesis at this time because the patient is not in extremis, is very unlikely to have SBP and his platelets are less than 50. Dr. Judie Wallace would like to start the patient back on diuretics and normal medication . He will follow up with the patient in the GI clinic.  7:03 PM, 05/04/2014     Disposition:  Diagnosis:   1. Ascites due to alcoholic cirrhosis ([place])    2. Bilateral leg edema    3. Thrombocytopenia ([place])        Disposition: Discharge     Follow-up Information     Follow up With Details Comments [place] S To, MD Schedule an appointment as soon as possible for a visit in 2 days As needed, If symptoms worsen [place] [address]  [phone]      Bernette Mayers, MD Schedule an appointment as soon as possible for a visit in 2 days If symptoms worsen, As needed [facility]   [place] [address]  [phone]             Current Discharge Medication List      START taking these medications    Details   furosemide (LASIX) 40 mg tablet Take 1 Tab by mouth daily for 30 days.  Qty: 30 Tab, Refills: 0  rifaximin (XIFAXAN) 550 mg tablet Take 1 Tab by mouth daily for 30 days.  Qty: 30 Tab, Refills: 0      oxyCODONE IR (ROXICODONE) 5 mg immediate release tablet Take 1 Tab by mouth every eight (8) hours as needed for Pain for up to 10 days. Max Daily Amount: 15 mg.  Qty: 30 Tab, Refills: 0         CONTINUE these medications which have CHANGED    Details   spironolactone (ALDACTONE) 100 mg tablet Take 1 Tab by mouth daily for 30 days.  Qty: 30 Tab, Refills: 0      nadolol (CORGARD) 20 mg tablet Take 1 Tab by mouth daily for 30 days.  Qty: 30 Tab, Refills: 0      pantoprazole (PROTONIX) 40 mg tablet Take 1 Tab by mouth daily for 30 days.  Qty: 30 Tab, Refills: 0         CONTINUE these medications which have NOT CHANGED    Details    folic acid (FOLVITE) 1 mg tablet Take 1 tablet by mouth daily.  Qty: 30 tablet, Refills: 1      multivitamin capsule Take 1 capsule by mouth daily.  Qty: 30 capsule, Refills: 0      thiamine (B-1) 100 mg tablet Take 1 tablet by mouth daily.  Qty: 30 tablet, Refills: 1      ferrous sulfate 325 mg (65 mg iron) tablet Take 1 tablet by mouth Daily (before breakfast).  Qty: 30 tablet, Refills: 0         STOP taking these medications       oxyCODONE-acetaminophen (PERCOCET) 5-325 mg per tablet Comments:   Reason for Stopping:               Scribe Attestation:  I, Darla Lesches, am acting as a scribe for and in the presence of Dr. Evelena Leyden, MD May 04, 2014 at 3:54 PM       Provider Attestation:   I personally performed the services described in the documentation, reviewed the documentation, as recorded by the scribe in my presence, and it accurately and completely records my words and actions.     Reviewed and signed by:  Dr. Evelena Leyden, MD

## 2014-05-04 NOTE — Other (Signed)
I have reviewed discharge instructions with the patient.  The patient verbalized understanding.  Patient armband removed and shredded

## 2014-05-04 NOTE — ED Notes (Cosign Needed)
I performed a brief evaluation, including history and physical, of the patient here in triage and I have determined that pt will need further treatment and evaluation from the main side ER physician.  I have placed initial orders to help in expediting patients care.     May 04, 2014 at 1:43 PM - Todd Mealyaylor Shaeley Segall, PA        There were no vitals taken for this visit.

## 2014-05-04 NOTE — ED Notes (Addendum)
Pt. C/o  Abdominal pain/swelling, , lower leg pain /swelling worse on  Left leg for 3 days, shortness of breath when he moves around

## 2014-05-05 LAB — EKG, 12 LEAD, INITIAL
Atrial Rate: 81 {beats}/min
Calculated P Axis: 9 degrees
Calculated R Axis: 7 degrees
Calculated T Axis: 14 degrees
Diagnosis: NORMAL
P-R Interval: 120 ms
Q-T Interval: 406 ms
QRS Duration: 82 ms
QTC Calculation (Bezet): 471 ms
Ventricular Rate: 81 {beats}/min

## 2014-05-07 ENCOUNTER — Encounter

## 2014-05-09 ENCOUNTER — Inpatient Hospital Stay
Admit: 2014-05-09 | Discharge: 2014-05-10 | Disposition: A | Discharge status: Home / Self Care | Payer: Self-pay | Attending: Emergency Medicine

## 2014-05-09 DIAGNOSIS — K7460 Unspecified cirrhosis of liver: Secondary | ICD-10-CM

## 2014-05-09 LAB — CBC WITH AUTOMATED DIFF
ABS. BASOPHILS: 0.1 10*3/uL — ABNORMAL HIGH (ref 0.0–0.06)
ABS. EOSINOPHILS: 0.4 10*3/uL (ref 0.0–0.4)
ABS. LYMPHOCYTES: 0.6 10*3/uL — ABNORMAL LOW (ref 0.9–3.6)
ABS. MONOCYTES: 0.5 10*3/uL (ref 0.05–1.2)
ABS. NEUTROPHILS: 3.1 10*3/uL (ref 1.8–8.0)
BASOPHILS: 1 % (ref 0–2)
EOSINOPHILS: 9 % — ABNORMAL HIGH (ref 0–5)
HCT: 38.3 % (ref 36.0–48.0)
HGB: 13.1 g/dL (ref 13.0–16.0)
LYMPHOCYTES: 12 % — ABNORMAL LOW (ref 21–52)
MCH: 31.4 PG (ref 24.0–34.0)
MCHC: 34.2 g/dL (ref 31.0–37.0)
MCV: 91.8 FL (ref 74.0–97.0)
MONOCYTES: 11 % — ABNORMAL HIGH (ref 3–10)
MPV: 11.3 FL (ref 9.2–11.8)
NEUTROPHILS: 67 % (ref 40–73)
PLATELET: 53 10*3/uL — ABNORMAL LOW (ref 135–420)
RBC: 4.17 M/uL — ABNORMAL LOW (ref 4.70–5.50)
RDW: 15.3 % — ABNORMAL HIGH (ref 11.6–14.5)
WBC: 4.6 10*3/uL (ref 4.6–13.2)

## 2014-05-09 LAB — METABOLIC PANEL, COMPREHENSIVE
A-G Ratio: 0.7 — ABNORMAL LOW (ref 0.8–1.7)
ALT (SGPT): 67 U/L — ABNORMAL HIGH (ref 16–61)
AST (SGOT): 74 U/L — ABNORMAL HIGH (ref 15–37)
Albumin: 3.1 g/dL — ABNORMAL LOW (ref 3.4–5.0)
Alk. phosphatase: 113 U/L (ref 45–117)
Anion gap: 3 mmol/L (ref 3.0–18)
BUN/Creatinine ratio: 11 — ABNORMAL LOW (ref 12–20)
BUN: 9 MG/DL (ref 7.0–18)
Bilirubin, total: 0.7 MG/DL (ref 0.2–1.0)
CO2: 29 mmol/L (ref 21–32)
Calcium: 8.1 MG/DL — ABNORMAL LOW (ref 8.5–10.1)
Chloride: 105 mmol/L (ref 100–108)
Creatinine: 0.8 MG/DL (ref 0.6–1.3)
GFR est AA: >60 mL/min/{1.73_m2} (ref >60)
GFR est non-AA: >60 mL/min/{1.73_m2} (ref >60)
Globulin: 4.4 g/dL — ABNORMAL HIGH (ref 2.0–4.0)
Glucose: 141 mg/dL — ABNORMAL HIGH (ref 74–99)
Potassium: 4 mmol/L (ref 3.5–5.5)
Protein, total: 7.5 g/dL (ref 6.4–8.2)
Sodium: 137 mmol/L (ref 136–145)

## 2014-05-09 LAB — PROTHROMBIN TIME + INR
INR: 1.2 (ref 0.8–1.2)
Prothrombin time: 15.5 s — ABNORMAL HIGH (ref 11.5–15.2)

## 2014-05-09 LAB — URINALYSIS W/ RFLX MICROSCOPIC
Bilirubin: NEGATIVE
Blood: NEGATIVE
Glucose: NEGATIVE mg/dL
Ketone: NEGATIVE mg/dL
Leukocyte Esterase: NEGATIVE
Nitrites: NEGATIVE
Protein: NEGATIVE mg/dL
Specific gravity: 1.013 (ref 1.005–1.030)
Urobilinogen: 0.2 EU/dL (ref 0.2–1.0)
pH (UA): 5.5 (ref 5.0–8.0)

## 2014-05-09 LAB — PTT: aPTT: 33.7 s (ref 24.6–37.7)

## 2014-05-09 LAB — LIPASE: Lipase: 152 U/L (ref 73–393)

## 2014-05-09 LAB — AMMONIA: Ammonia, plasma: 11 umol/L (ref 11–32)

## 2014-05-09 MED ORDER — FUROSEMIDE 10 MG/ML IJ SOLN
10 mg/mL | INTRAMUSCULAR | Status: AC
Start: 2014-05-09 — End: 2014-05-09
  Administered 2014-05-09: via INTRAVENOUS

## 2014-05-09 MED ORDER — HYDROMORPHONE (PF) 1 MG/ML IJ SOLN
1 mg/mL | Freq: Once | INTRAMUSCULAR | Status: AC
Start: 2014-05-09 — End: 2014-05-09
  Administered 2014-05-09: 23:00:00 via INTRAVENOUS

## 2014-05-09 MED FILL — HYDROMORPHONE (PF) 1 MG/ML IJ SOLN: 1 mg/mL | INTRAMUSCULAR | Qty: 1

## 2014-05-09 MED FILL — FUROSEMIDE 10 MG/ML IJ SOLN: 10 mg/mL | INTRAMUSCULAR | Qty: 4

## 2014-05-09 NOTE — ED Notes (Cosign Needed)
I performed a brief evaluation, including history and physical, of the patient here in triage and I have determined that pt will need further treatment and evaluation from the main side ER physician.  I have placed initial orders to help in expediting patients care.     May 09, 2014 at 2:41 PM - Reem Fleury, PA        There were no vitals taken for this visit.

## 2014-05-09 NOTE — ED Notes (Signed)
V/S re check   Still having same abdominal pain & leg pain

## 2014-05-09 NOTE — ED Notes (Signed)
Assumed care of Pt. Pt to ED bed 07 from results waiting. Pt brought to room by wheelchair. Pt connected to monitor for continuous bp and pulse ox monitoring per protocol. Pt given call bell and instructed to call for assistance. Will continue to monitor.

## 2014-05-09 NOTE — ED Notes (Signed)
Pt. C/o abdominal  Pain ,  For over a week,  Left leg pain  Yesterday ,  Dizziness, started today

## 2014-05-09 NOTE — ED Provider Notes (Addendum)
HPI Comments: 6:42 PM [name] is a 64 y.o. male with a history of HTN, GI bleeding and alcohol abuse who presents to ED c/o abd pain onset 7 days ago. Pt explains that he feels bloated and like he has  "ammonia build up due to liver condition". Pt also c/o leg pain (swelling), lower foot pain, and dizziness (Pt states he "can't think straight"). Pt denies drinking etoh "for a long time". No other concerns at this time.       PCP: Nolene Bernheim TO, MD            Patient is a 64 y.o. male presenting with abdominal pain, leg pain, and dizziness. The history is provided by the patient and the spouse.   Abdominal Pain   The current episode started 2 days ago. Associated symptoms include myalgias (lower extremities). Pertinent negatives include no fever, no diarrhea, no nausea, no vomiting, no dysuria, no frequency, no headaches, no chest pain and no back pain.   Leg Pain   Pertinent negatives include no back pain and no neck pain.   Dizziness   Associated symptoms include abdominal pain, dizziness and light-headedness. Pertinent negatives include no chest pain, no palpitations, no confusion, no diaphoresis, no fever, no nausea, no vomiting, no congestion, no headaches, no back pain and no seizures. His past medical history does not include no syncope.        Past Medical History:   Diagnosis Date   ??? Hypertension    ??? Ill-defined condition      anemia   ??? Anemia    ??? Varices    ??? Hepatitis C virus    ??? GI bleed    ??? Alcohol abuse    ??? Hernia        Past Surgical History:   Procedure Laterality Date   ??? Upper gi endoscopy,ligat varix  01/20/2013               Family History:   Problem Relation Age of Onset   ??? Heart Disease Father 71     MI   ??? Heart Disease Brother        History     Social History   ??? Marital Status: SINGLE     Spouse Name: N/A   ??? Number of Children: N/A   ??? Years of Education: N/A     Occupational History   ??? Not on file.     Social History Main Topics   ??? Smoking status: Former Smoker    ??? Smokeless tobacco: Never Used   ??? Alcohol Use: No      Comment: quit drinking December 2014   ??? Drug Use: No   ??? Sexual Activity: Not on file     Other Topics Concern   ??? Not on file     Social History Narrative           ALLERGIES: Review of patient's allergies indicates no known allergies.      Review of Systems   Constitutional: Negative.  Negative for fever, chills and diaphoresis.   HENT: Negative.  Negative for congestion, ear pain, sore throat and trouble swallowing.    Eyes: Negative.  Negative for photophobia, pain, redness and visual disturbance.   Respiratory: Negative.  Negative for cough, chest tightness, shortness of breath and wheezing.    Cardiovascular: Negative.  Negative for chest pain and palpitations.   Gastrointestinal: Positive for abdominal pain. Negative for nausea, vomiting, diarrhea and blood in  stool.   Genitourinary: Negative for dysuria and frequency.   Musculoskeletal: Positive for myalgias (lower extremities). Negative for back pain, joint swelling and neck pain.   Skin: Negative.    Neurological: Positive for dizziness and light-headedness. Negative for seizures, syncope and headaches.   Psychiatric/Behavioral: Negative.  Negative for behavioral problems and confusion. The patient is not nervous/anxious.    All other systems reviewed and are negative.      Filed Vitals:    05/09/14 1445 05/09/14 1756   BP: 131/76 121/81   Pulse: 82 80   Temp: 97.8 ??F (36.6 ??C) 97.5 ??F (36.4 ??C)   Resp: 16 18   Height: 5' 10"  (1.778 m)    Weight: 108.863 kg (240 lb)    SpO2: 100% 99%            Physical Exam   CONSTITUTIONAL: Alert, in no apparent distress; well-developed; well-nourished.   HEAD:  Normocephalic, atraumatic.   EYES: PERRL; EOM's intact.   ENTM: Nose: no rhinorrhea; Throat: mucous membranes moist. TMs-normal bilaterally. Posterior pharynx-normal.  Neck:  No JVD, supple without lymphadenopathy.  RESP: Chest clear, equal breath sounds. No wheezing, no rales and no rhonchi.    CV: S1 and S2 WNL; No murmurs, gallops or rubs.   GI: Abdomen soft and non-tender. Mildly distended abd and peripheral edema.   UPPER EXT:  Normal inspection.   LOWER EXT: No edema.   NEURO: Grossly normal.   SKIN: No rashes; Normal for age and stage.   PSYCH:  Alert and oriented, normal affect.    MDM  Number of Diagnoses or Management Options  Cirrhosis of liver with ascites, unspecified hepatic cirrhosis type [facility]):   Diagnosis management comments: Pt with mild distention and ascites treated with lasix in the ED NH3 not elevated. Unlikely SBP      Procedures    Vitals:  Patient Vitals for the past 12 hrs:   Temp Pulse Resp BP SpO2   05/09/14 1756 97.5 ??F (36.4 ??C) 80 18 121/81 mmHg 99 %   05/09/14 1445 97.8 ??F (36.6 ??C) 82 16 131/76 mmHg 100 %         Medications ordered:   Medications   HYDROmorphone (PF) (DILAUDID) injection 1 mg (not administered)   HYDROmorphone (PF) (DILAUDID) injection 1 mg (not administered)         Lab findings:  Recent Results (from the past 12 hour(s))   EKG, 12 LEAD, INITIAL    Collection Time: 05/09/14  2:54 PM   Result Value Ref Range    Ventricular Rate 73 BPM    Atrial Rate 73 BPM    P-R Interval 126 ms    QRS Duration 82 ms    Q-T Interval 384 ms    QTC Calculation (Bezet) 423 ms    Calculated P Axis 38 degrees    Calculated R Axis 15 degrees    Calculated T Axis 32 degrees    Diagnosis       Normal sinus rhythm  Normal ECG  When compared with ECG of 04-May-2014 13:54,  No significant change was found     AMMONIA    Collection Time: 05/09/14  3:03 PM   Result Value Ref Range    Ammonia 11 11 - 32 UMOL/L   CBC WITH AUTOMATED DIFF    Collection Time: 05/09/14  3:07 PM   Result Value Ref Range    WBC 4.6 4.6 - 13.2 K/uL    RBC 4.17 (L) 4.70 - 5.50  M/uL    HGB 13.1 13.0 - 16.0 g/dL    HCT 38.3 36.0 - 48.0 %    MCV 91.8 74.0 - 97.0 FL    MCH 31.4 24.0 - 34.0 PG    MCHC 34.2 31.0 - 37.0 g/dL    RDW 15.3 (H) 11.6 - 14.5 %    PLATELET 53 (L) 135 - 420 K/uL    MPV 11.3 9.2 - 11.8 FL     NEUTROPHILS 67 40 - 73 %    LYMPHOCYTES 12 (L) 21 - 52 %    MONOCYTES 11 (H) 3 - 10 %    EOSINOPHILS 9 (H) 0 - 5 %    BASOPHILS 1 0 - 2 %    ABS. NEUTROPHILS 3.1 1.8 - 8.0 K/UL    ABS. LYMPHOCYTES 0.6 (L) 0.9 - 3.6 K/UL    ABS. MONOCYTES 0.5 0.05 - 1.2 K/UL    ABS. EOSINOPHILS 0.4 0.0 - 0.4 K/UL    ABS. BASOPHILS 0.1 (H) 0.0 - 0.06 K/UL    DF AUTOMATED     METABOLIC PANEL, COMPREHENSIVE    Collection Time: 05/09/14  3:07 PM   Result Value Ref Range    Sodium 137 136 - 145 mmol/L    Potassium 4.0 3.5 - 5.5 mmol/L    Chloride 105 100 - 108 mmol/L    CO2 29 21 - 32 mmol/L    Anion gap 3 3.0 - 18 mmol/L    Glucose 141 (H) 74 - 99 mg/dL    BUN 9 7.0 - 18 MG/DL    Creatinine 0.80 0.6 - 1.3 MG/DL    BUN/Creatinine ratio 11 (L) 12 - 20      GFR est AA >60 >60 ml/min/1.41m    GFR est non-AA >60 >60 ml/min/1.764m   Calcium 8.1 (L) 8.5 - 10.1 MG/DL    Bilirubin, total 0.7 0.2 - 1.0 MG/DL    ALT 67 (H) 16 - 61 U/L    AST 74 (H) 15 - 37 U/L    Alk. phosphatase 113 45 - 117 U/L    Protein, total 7.5 6.4 - 8.2 g/dL    Albumin 3.1 (L) 3.4 - 5.0 g/dL    Globulin 4.4 (H) 2.0 - 4.0 g/dL    A-G Ratio 0.7 (L) 0.8 - 1.7     LIPASE    Collection Time: 05/09/14  3:07 PM   Result Value Ref Range    Lipase 152 73 - 393 U/L   PROTHROMBIN TIME + INR    Collection Time: 05/09/14  3:07 PM   Result Value Ref Range    Prothrombin time 15.5 (H) 11.5 - 15.2 sec    INR 1.2 0.8 - 1.2     PTT    Collection Time: 05/09/14  3:07 PM   Result Value Ref Range    aPTT 33.7 24.6 - 37.7 SEC       EKG interpretation by ED Physician:      X-Ray, CT or other radiology findings or impressions:  No orders to display       Progress notes, Consult notes or additional Procedure notes:   7:26 PM: Reassessed the patient and discussed their results and diagnosis. Pt will be discharged in stable condition.  Patient is to return to the emergency department for any new or worsening conditions.  Patient understands and agrees with discharge plan.         Reevaluation of patient:       Disposition:  Diagnosis: No diagnosis found.    Disposition:     Follow-up Information     None           Patient's Medications   Start Taking    No medications on file   Continue Taking    FERROUS SULFATE 325 MG (65 MG IRON) TABLET    Take 1 tablet by mouth Daily (before breakfast).    FOLIC ACID (FOLVITE) 1 MG TABLET    Take 1 tablet by mouth daily.    FUROSEMIDE (LASIX) 40 MG TABLET    Take 1 Tab by mouth daily for 30 days.    MULTIVITAMIN CAPSULE    Take 1 capsule by mouth daily.    NADOLOL (CORGARD) 20 MG TABLET    Take 1 Tab by mouth daily for 30 days.    OXYCODONE IR (ROXICODONE) 5 MG IMMEDIATE RELEASE TABLET    Take 1 Tab by mouth every eight (8) hours as needed for Pain for up to 10 days. Max Daily Amount: 15 mg.    PANTOPRAZOLE (PROTONIX) 40 MG TABLET    Take 1 Tab by mouth daily for 30 days.    RIFAXIMIN (XIFAXAN) 550 MG TABLET    Take 1 Tab by mouth daily for 30 days.    SPIRONOLACTONE (ALDACTONE) 100 MG TABLET    Take 1 Tab by mouth daily for 30 days.    THIAMINE (B-1) 100 MG TABLET    Take 1 tablet by mouth daily.   These Medications have changed    No medications on file   Stop Taking    No medications on file       Scribe Attestation  I, Hanley Seamen, am scribing for, and in the presence of Dr. Trevor Mace, DO 6:48 PM, 05/09/2014.    Physician Attestation  I personally performed the services described in the documentation, reviewed the documentation, as recorded by the scribe in my presence, and it accurately and completely records my words and actions.    Trevor Mace, DO      Provider Attestation:   I personally performed the services described in the documentation, reviewed the documentation, as recorded by the scribe in my presence, and it accurately and completely records my words and actions. May 09, 2014 at 7:52 PM - [place], DO

## 2014-05-10 LAB — EKG, 12 LEAD, INITIAL
Atrial Rate: 73 {beats}/min
Calculated P Axis: 38 degrees
Calculated R Axis: 15 degrees
Calculated T Axis: 32 degrees
Diagnosis: NORMAL
P-R Interval: 126 ms
Q-T Interval: 384 ms
QRS Duration: 82 ms
QTC Calculation (Bezet): 423 ms
Ventricular Rate: 73 {beats}/min

## 2014-05-10 MED ORDER — HYDROMORPHONE (PF) 1 MG/ML IJ SOLN
1 mg/mL | Freq: Once | INTRAMUSCULAR | Status: AC
Start: 2014-05-10 — End: 2014-05-09
  Administered 2014-05-10: via INTRAVENOUS

## 2014-05-10 MED FILL — HYDROMORPHONE (PF) 1 MG/ML IJ SOLN: 1 mg/mL | INTRAMUSCULAR | Qty: 1

## 2014-05-13 ENCOUNTER — Encounter

## 2014-05-13 ENCOUNTER — Inpatient Hospital Stay: Admit: 2014-05-13 | Payer: Self-pay | Attending: Gastroenterology | Primary: Family Medicine

## 2014-05-13 DIAGNOSIS — R188 Other ascites: Secondary | ICD-10-CM

## 2014-05-17 ENCOUNTER — Inpatient Hospital Stay
Admit: 2014-05-17 | Discharge: 2014-05-20 | Disposition: A | Discharge status: Home / Self Care | Payer: Self-pay | Attending: Internal Medicine | Admitting: Internal Medicine

## 2014-05-17 ENCOUNTER — Emergency Department: Admit: 2014-05-17 | Payer: Self-pay | Primary: Family Medicine

## 2014-05-17 DIAGNOSIS — K921 Melena: Secondary | ICD-10-CM

## 2014-05-17 LAB — METABOLIC PANEL, COMPREHENSIVE
A-G Ratio: 0.8 (ref 0.8–1.7)
ALT (SGPT): 70 U/L — ABNORMAL HIGH (ref 16–61)
AST (SGOT): 67 U/L — ABNORMAL HIGH (ref 15–37)
Albumin: 3.1 g/dL — ABNORMAL LOW (ref 3.4–5.0)
Alk. phosphatase: 99 U/L (ref 45–117)
Anion gap: 5 mmol/L (ref 3.0–18)
BUN/Creatinine ratio: 39 — ABNORMAL HIGH (ref 12–20)
BUN: 28 MG/DL — ABNORMAL HIGH (ref 7.0–18)
Bilirubin, total: 1 MG/DL (ref 0.2–1.0)
CO2: 28 mmol/L (ref 21–32)
Calcium: 7.7 MG/DL — ABNORMAL LOW (ref 8.5–10.1)
Chloride: 103 mmol/L (ref 100–108)
Creatinine: 0.72 MG/DL (ref 0.6–1.3)
GFR est AA: >60 mL/min/{1.73_m2} (ref >60)
GFR est non-AA: >60 mL/min/{1.73_m2} (ref >60)
Globulin: 4.1 g/dL — ABNORMAL HIGH (ref 2.0–4.0)
Glucose: 142 mg/dL — ABNORMAL HIGH (ref 74–99)
Potassium: 4.3 mmol/L (ref 3.5–5.5)
Protein, total: 7.2 g/dL (ref 6.4–8.2)
Sodium: 136 mmol/L (ref 136–145)

## 2014-05-17 LAB — PROTHROMBIN TIME + INR
INR: 1.3 — ABNORMAL HIGH (ref 0.8–1.2)
Prothrombin time: 16.1 s — ABNORMAL HIGH (ref 11.5–15.2)

## 2014-05-17 LAB — PTT: aPTT: 30.9 s (ref 24.6–37.7)

## 2014-05-17 LAB — CBC WITH AUTOMATED DIFF
ABS. BASOPHILS: 0 10*3/uL (ref 0.0–0.06)
ABS. EOSINOPHILS: 0.3 10*3/uL (ref 0.0–0.4)
ABS. LYMPHOCYTES: 1.1 10*3/uL (ref 0.9–3.6)
ABS. MONOCYTES: 1 10*3/uL (ref 0.05–1.2)
ABS. NEUTROPHILS: 5.7 10*3/uL (ref 1.8–8.0)
BASOPHILS: 0 % (ref 0–2)
EOSINOPHILS: 4 % (ref 0–5)
HCT: 40.2 % (ref 36.0–48.0)
HGB: 13.5 g/dL (ref 13.0–16.0)
LYMPHOCYTES: 13 % — ABNORMAL LOW (ref 21–52)
MCH: 31 PG (ref 24.0–34.0)
MCHC: 33.6 g/dL (ref 31.0–37.0)
MCV: 92.4 FL (ref 74.0–97.0)
MONOCYTES: 12 % — ABNORMAL HIGH (ref 3–10)
MPV: 11.7 FL (ref 9.2–11.8)
NEUTROPHILS: 71 % (ref 40–73)
PLATELET: 57 10*3/uL — ABNORMAL LOW (ref 135–420)
RBC: 4.35 M/uL — ABNORMAL LOW (ref 4.70–5.50)
RDW: 15.1 % — ABNORMAL HIGH (ref 11.6–14.5)
WBC: 8.1 10*3/uL (ref 4.6–13.2)

## 2014-05-17 LAB — MAGNESIUM: Magnesium: 1.8 mg/dL (ref 1.8–2.4)

## 2014-05-17 LAB — URINALYSIS W/ RFLX MICROSCOPIC
Bilirubin: NEGATIVE
Blood: NEGATIVE
Glucose: NEGATIVE mg/dL
Ketone: NEGATIVE mg/dL
Leukocyte Esterase: NEGATIVE
Nitrites: NEGATIVE
Protein: NEGATIVE mg/dL
Specific gravity: 1.017 (ref 1.005–1.030)
Urobilinogen: 0.2 EU/dL (ref 0.2–1.0)
pH (UA): 6.5 (ref 5.0–8.0)

## 2014-05-17 LAB — LIPASE: Lipase: 302 U/L (ref 73–393)

## 2014-05-17 LAB — POC LACTIC ACID: Lactic Acid (POC): 1.4 mmol/L (ref 0.4–2.0)

## 2014-05-17 LAB — AMMONIA: Ammonia, plasma: 231 umol/L — ABNORMAL HIGH (ref 11–32)

## 2014-05-17 LAB — CK: CK: 509 U/L — ABNORMAL HIGH (ref 39–308)

## 2014-05-17 LAB — TYPE & SCREEN
ABO/Rh(D): A POS
Antibody screen: NEGATIVE

## 2014-05-17 LAB — TYPE AND SCREEN
ABO/Rh: A POS
Antibody Screen: NEGATIVE

## 2014-05-17 MED ORDER — SODIUM CHLORIDE 0.9% BOLUS IV
0.9 % | Freq: Once | INTRAVENOUS | Status: AC
Start: 2014-05-17 — End: 2014-05-17
  Administered 2014-05-17: 17:00:00 via INTRAVENOUS

## 2014-05-17 MED ORDER — CIPROFLOXACIN 500 MG TAB
500 mg | Freq: Two times a day (BID) | ORAL | Status: DC
Start: 2014-05-17 — End: 2014-05-20
  Administered 2014-05-17 – 2014-05-20 (×6): via ORAL

## 2014-05-17 MED ORDER — PANTOPRAZOLE 40 MG IV SOLR
40 mg | Freq: Two times a day (BID) | INTRAVENOUS | Status: DC
Start: 2014-05-17 — End: 2014-05-18
  Administered 2014-05-17 – 2014-05-18 (×3): via INTRAVENOUS

## 2014-05-17 MED ORDER — HYDROMORPHONE (PF) 1 MG/ML IJ SOLN
1 mg/mL | Freq: Once | INTRAMUSCULAR | Status: AC
Start: 2014-05-17 — End: 2014-05-17
  Administered 2014-05-17: 19:00:00 via INTRAVENOUS

## 2014-05-17 MED ORDER — LACTULOSE 10 GRAM/15 ML ORAL SOLUTION
10 gram/15 mL | Freq: Two times a day (BID) | ORAL | Status: DC
Start: 2014-05-17 — End: 2014-05-17
  Administered 2014-05-17: 22:00:00 via ORAL

## 2014-05-17 MED ORDER — HYDROMORPHONE (PF) 1 MG/ML IJ SOLN
1 mg/mL | Freq: Once | INTRAMUSCULAR | Status: AC
Start: 2014-05-17 — End: 2014-05-17
  Administered 2014-05-17: 17:00:00 via INTRAVENOUS

## 2014-05-17 MED ORDER — HYDROMORPHONE (PF) 1 MG/ML IJ SOLN
1 mg/mL | Freq: Four times a day (QID) | INTRAMUSCULAR | Status: DC | PRN
Start: 2014-05-17 — End: 2014-05-17
  Administered 2014-05-17: 22:00:00 via INTRAVENOUS

## 2014-05-17 MED ORDER — ONDANSETRON (PF) 4 MG/2 ML INJECTION
4 mg/2 mL | INTRAMUSCULAR | Status: AC
Start: 2014-05-17 — End: 2014-05-17
  Administered 2014-05-17: 17:00:00 via INTRAVENOUS

## 2014-05-17 MED FILL — SODIUM CHLORIDE 0.9 % IV: INTRAVENOUS | Qty: 500

## 2014-05-17 MED FILL — HYDROMORPHONE (PF) 1 MG/ML IJ SOLN: 1 mg/mL | INTRAMUSCULAR | Qty: 1

## 2014-05-17 MED FILL — PROTONIX 40 MG INTRAVENOUS SOLUTION: 40 mg | INTRAVENOUS | Qty: 40

## 2014-05-17 MED FILL — ONDANSETRON HCL 4 MG/2 ML INTRAVENOUS SYRINGE: 4 mg/2 mL | INTRAVENOUS | Qty: 2

## 2014-05-17 MED FILL — CIPROFLOXACIN 500 MG TAB: 500 mg | ORAL | Qty: 1

## 2014-05-17 MED FILL — LACTULOSE 20 GRAM/30 ML ORAL SOLUTION: 20 gram/30 mL | ORAL | Qty: 30

## 2014-05-17 NOTE — ED Notes (Signed)
TRANSFER - OUT REPORT:    Verbal report given to Amy Matias RN(name) on [name]  being transferred to 3N (unit) for routine progression of care       Report consisted of patient???s Situation, Background, Assessment and   Recommendations(SBAR).     Information from the following report(s) SBAR, ED Summary, [facility] and Recent Results was reviewed with the receiving nurse.    Lines:   Peripheral IV 05/17/14 Left Antecubital (Active)   Site Assessment Clean, dry, & intact 05/17/2014  1:08 PM   Phlebitis Assessment 0 05/17/2014  1:08 PM   Infiltration Assessment 0 05/17/2014  1:08 PM   Dressing Status Clean, dry, & intact 05/17/2014  1:08 PM   Dressing Type Transparent 05/17/2014  1:08 PM   Hub Color/Line Status Patent;Pink 05/17/2014  1:08 PM        Opportunity for questions and clarification was provided.      Patient transported with:   Monitor  Registered Nurse

## 2014-05-17 NOTE — H&P (Signed)
History and Physical    See dictated H/P  [phone]

## 2014-05-17 NOTE — Other (Addendum)
Transported  Patient per stretcher to room. Patient A/O x 3 in no distress. orientation to room provided. Call light placed within reach.    2042-Placed page to MD regarding pain. Was given dilaudid earlier in E.R but patient states no relief. Received orders from Dr. Excell SeltzerBaker to increase frequency of dilaudid to every 3 hours PRN and sleeping pill.

## 2014-05-17 NOTE — ED Notes (Signed)
Pt resting in bed watching TV, all safety measures noted and in place, call light within reach, denies intervention needs, updated on status of care.

## 2014-05-17 NOTE — H&P (Signed)
[facility]                               [place],  [address]                                   PRE-ADMISSION                             HISTORY AND PHYSICAL    PATIENT:    [name], [name]  MRN:           [id number]       DATE:   05/17/2014  BILLING:       [id number]    ROOM:   ED  [id number]  DICTATING:  Charlett Blake, MD      HISTORY OF PRESENT ILLNESS: The patient is a 64 year old man with a history  of alcoholic cirrhosis, hepatitis C, history of ascites, chronic abdominal  pain. He presented to the emergency room, noting that he was awakened about  3 a.m. with abdominal pain, both in the upper abdomen, in the epigastrium,  and also in the lower abdomen. This was followed shortly thereafter by  melenic stools. He also notes that he has been very fatigued, feeling  weaker, and more thirsty recently. He says that he had run out of many of  his medications. The only meds he had left has been his Lasix, Aldactone,  and lactulose. He thinks he ran out of his other medications a couple of  weeks ago. He has a past medical history of known alcoholic and hepatitis C  cirrhosis with ascites. He gets periodic paracentesis, but he said when he  went to his appt to get that done maybe a few weeks ago, it was not needed because he  had actually done better. He has been in the ER on 05/09/2014 with  abdominal pain, mild ascites. It was felt that he did need to be admitted,  and he was told to resume his medications. He was given a prescription, but  then he was supposed to follow up and then get a primary doctor and that  did not happen. He had some nausea but denies any vomiting.    In the emergency room, he was found to have a mild tachycardia, but stable  blood pressure. Abdomen showed a little bit of distention, was really  minimally tender. His stool showed dark melenic stool strongly   heme-positive. The patient has chronically  low platelets and coagulopathy  due to cirrhosis with mildly elevated INR, as well. The ER doctor spoke  with Dr. Daivd Council, [place], who recommended a PPI as well as Cipro orally be started   because of his known history of ascites and a regular diet, and that he  would consult on the patient. The ER doctor talked to me and the patient  was admitted. He is ordered to receive IV Protonix, Cipro, and he has  received Dilaudid in the emergency room. The patient said he had not quite  been himself. He does not feel he has been real confused, but he said he  has had a poor energy level, some  trouble swallowing, loss of appetite.    PAST MEDICAL HISTORY  1. 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  2. Chronic ascites.  3. Chronic abdominal pain.  4. Thrombocytopenia. It looks like platelets have run 40-50 K  recently.  5. History of alcohol abuse. He said he discontinued this in 2009.  6. Anemia.  7. History of varices, apparently in the past.  8. History of hepatic encephalopathy. He was admitted at [facility]  in January with hepatic encephalopathy.    ALLERGIES: HE HAS NO KNOWN DRUG ALLERGIES.    MEDICATIONS  Currently. All he has had left has been  1. Lasix 40 mg daily.  2. Aldactone 100 mg daily.  3. Lactulose 10 g per 15 mL, 15 mL three times a day.    The medications he has been out of but is supposed to take are  1. Iron 325 mg one daily.  2. Folic acid 1 mg daily.  3. Multivitamin 1 daily.  4. Nadolol 20 mg daily.  5. Protonix 40 mg daily.  6. Xifaxan 550 daily.  7. B12.  8. B1 100 mg daily.    All of which he has been out of.    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 He  quit smoking 16 years ago. He said he quit drinking alcohol completely in  November 2015 when his mom passed. He denied any recent ETOH use.    REVIEW OF SYSTEMS: Noted, and it is positive for fatigue, very poor energy   level recently. GI: Abdominal pain and melena, blood in his stool,  anorexia, some dysphasia. He also says he has a lot of chronic back pain as  well, mainly belly pain.Except as noted positives above, a 10-point review of systems was negative.      FAMILY HISTORY: Positive for heart disease in his dad and his brother.    PHYSICAL EXAMINATION  VITAL SIGNS: Temperature is 97.7, BP 119/77, heart rate 105, respiratory  rate 18, 100% saturation.  CONSTITUTIONAL: He is alert, but with circumstantial speech. He is  well-developed and well-nourished, in no acute distress currently.  HEENT: Head normocephalic, atraumatic.  NECK: Supple. No JVD present.  LUNGS: Clear to auscultation with no accessory muscle use. No respiratory  distress.  HEART: Sounds regular, mild tachycardia. No murmur or gallop heard.  ABDOMEN: Minimal distention, really minimal lower abdominal tenderness, no  rebound felt. He does have an umbilical hernia. No peritoneal signs or  rebound.  RECTAL: Already done by the ER  And was not repeated. It was reported as being  melenic, was strongly heme-positive.  EXTREMITIES: Normal range of motion. He really did not have any significant  edema right now in his legs, although he reports he often has that.  NEUROLOGIC: Alert and oriented x3. His speech was a little bit  circumstantial. No cranial nerve deficit noted. Strength seemed to be  acceptable, upper and lower extremities.  SKIN: I did not notice a rash.  PSYCHIATRIC: Normal affect, but speech was somewhat circumstantial. He did  tell me he was sure that he was going to need quite a number of pain  medications and medications for insomnia tonight.    LABORATORIES: White count 8.1, hemoglobin 13.5, hematocrit 40.2, platelet  count 57,000. It has been running most recently was 45 and 53. INR 1.3, a  little up. Sodium 136, potassium 4.3, chloride 103, bicarbonate 28, glucose  142, BUN 28, creatinine  0.72, calcium 7.7, magnesium 1.8. Ammonia is   elevated to 231. UA is negative. Lactic acid 1.4, calcium 7.7, ALT 70, AST  67, alk phos 99, protein 7.2, albumin 3.1. CK 509, lipase 302, normal. Pro  time 16.1, INR 1.3.    EKG sinus tachycardia, otherwise was normal.    IMPRESSION  1. A 64 year old man with a gastrointestinal bleed with strongly melenic  stools that started in the early morning hours. He is hemodynamically stable. Will  make sure he is typed and screened. Will check serial hemoglobin and  hematocrits. Dr. Daivd Council has already been consulted, and will see him in  the morning. He said a regular diet would be okay, so will start that.  2. Abdominal pain, may be secondary to #1. He does not have an acute abdomen  at the moment. Suspect related to the gastrointestinal bleed  Have started proton pump inhibitor.  3. History of ascites. It does seem to be too bad at the moment. He has  recently been out of a number of medications.  4. Elevated ammonia level, although the patient is oriented and is not currently confused .  Oral  Lactulose will  be ordered and Xifaxan will be resumed.  5. Chronic thrombocytopenia and some coagulopathy, likely due to his chronic  liver  disease.  6. Hepatitis C. He said he had not been treated for that, but he said Dr.  Daivd Council is trying to get him medications to treat this.  7. Previous alcohol use. He denies any use at all since November 2015.    PLAN: He will be admitted to telemetry, typed and screened. Hemoglobin and  hematocrit monitoring will be started. He will be continued on a  PPI, Protonix. Will start a regular diet and GI, some pain medications. I  am going to restart the lactulose, Xifaxan. Blood pressures are relatively  low, but once blood pressure is reasonable, we can start him back on his  nadolol. I will not resume that today but probably will be resumed in the  future. Continue his Aldactone. I am going to hold off on the Lasix, given   the recent GI bleed, and later, he should resume his folic acid and iron,  but I will not start that right during this admission. GI says they will  see him tomorrow. Elevated CK, can follow that.  For  DVT prophylaxis, just  SCDs. Obviously no anticoagulants. Also, the patient does not have a  regular primary care doctor, so will ask the social worker to see him to  try to facilitate that, and he also is having difficulty affording his  medications, so he is going to need some assistance with that.    TIME SPENT: Over 70 minutes spent in preparation of patient's admission.                     Charlett Blake, MD    DYD:wmx  D: 05/17/2014  04:47 P  T: 05/17/2014  05:43 P  Job: [id number]  CScriptDoc #: [id number]  cc:   Charlett Blake, MD        Bernette Mayers, MD        [employer] [facility]  DR. Lucia Gaskins, GI.

## 2014-05-17 NOTE — ED Notes (Signed)
Pt arrives via EMS with complaints of abdominal pain and rectal bleeding. Pt states he noticed tarry stool in the early a.m. He is also complaining of increased weakness and generalized fatigue.

## 2014-05-17 NOTE — ED Notes (Signed)
Pt transported to inpatient room with ED tech and inpatient nurse, pt in stable condition

## 2014-05-17 NOTE — Other (Addendum)
TRANSFER - IN REPORT:    Ver[facility] on [name]  being received from emergency room(unit) for routine progression of care      Report consisted of patient???s Situation, Background, Assessment and   Recommendations(SBAR).     Information from the following report(s) Kardex, MAR and Cardiac Rhythm sinus tachy was reviewed with the receiving nurse.    Opportunity for questions and clarification was provided.      Assessment completed upon patient???s arrival to unit and care assumed.

## 2014-05-17 NOTE — ED Provider Notes (Signed)
HPI Comments: [name] is a 64 y.o. male with a history of Hepatitis C who presents to ED c/o abd pain and dark black stool. Patient reports medication non-compliant. Patient c/o fatigue, SOB onset yesterday. Patient states that at [facility] today, he woke up w/ abd pain and went to the bathroom and noticed blood in his stool. No other associated symptoms or modifying factors at this time.    GI: Dr. Lucia Wallace      The history is provided by the patient and the EMS personnel.        Past Medical History:   Diagnosis Date   ??? Hypertension    ??? Ill-defined condition      anemia   ??? Anemia    ??? Varices    ??? Hepatitis C virus    ??? GI bleed    ??? Alcohol abuse    ??? Hernia        Past Surgical History:   Procedure Laterality Date   ??? Upper gi endoscopy,ligat varix  01/20/2013               Family History:   Problem Relation Age of Onset   ??? Heart Disease Father 43     MI   ??? Heart Disease Brother        History     Social History   ??? Marital Status: SINGLE     Spouse Name: N/A   ??? Number of Children: N/A   ??? Years of Education: N/A     Occupational History   ??? Not on file.     Social History Main Topics   ??? Smoking status: Former Smoker   ??? Smokeless tobacco: Never Used   ??? Alcohol Use: No      Comment: quit drinking December 2014   ??? Drug Use: No   ??? Sexual Activity: No     Other Topics Concern   ??? Not on file     Social History Narrative           ALLERGIES: Review of patient's allergies indicates no known allergies.      Review of Systems   Constitutional: Positive for fatigue. Negative for fever, chills and diaphoresis.   HENT: Negative.  Negative for congestion, ear pain, sore throat and trouble swallowing.    Eyes: Negative.  Negative for photophobia, pain, redness and visual disturbance.   Respiratory: Negative.  Negative for cough, chest tightness, shortness of breath and wheezing.    Cardiovascular: Negative.  Negative for chest pain and palpitations.    Gastrointestinal: Positive for abdominal pain and blood in stool. Negative for nausea, vomiting and diarrhea.   Genitourinary: Negative for dysuria and frequency.   Musculoskeletal: Negative.  Negative for back pain, joint swelling and neck pain.   Skin: Negative.    Neurological: Negative.  Negative for seizures, syncope and headaches.   Psychiatric/Behavioral: Negative.  Negative for behavioral problems and confusion. The patient is not nervous/anxious.    All other systems reviewed and are negative.      Filed Vitals:    05/17/14 1245 05/17/14 1250 05/17/14 1254   BP: 119/77 119/77    Pulse: 105 105    Temp:  97.7 ??F (36.5 ??C)    Resp: 14 18    Height:  5' 10"  (1.778 m)    Weight:  108.863 kg (240 lb)    SpO2: 99% 100% 100%            Physical Exam  Constitutional: He is oriented to person, place, and time. He appears well-developed and well-nourished. No distress.   HENT:   Head: Normocephalic and atraumatic.   Eyes: Conjunctivae and EOM are normal. Pupils are equal, round, and reactive to light. No scleral icterus.   Neck: Neck supple. No JVD present. No thyromegaly present.   Cardiovascular: Normal rate, regular rhythm, S1 normal and S2 normal.  Exam reveals no gallop and no friction rub.    No murmur heard.  Pulmonary/Chest: Effort normal and breath sounds normal. No accessory muscle usage. No respiratory distress.   Abdominal: He exhibits distension (mild). There is no rigidity, no rebound and no guarding.   Minimal lower abdominal tenderness to palpation.  No peritoneal sign, rebound, or guarding  Palpable liver in RUQ 4 cm below costal margin w/ tenderness to palpation    Rectal exam- dark black melenic stool which is strongly hemoccult positive.    Musculoskeletal: Normal range of motion. He exhibits no edema or tenderness.   Neurological: He is alert and oriented to person, place, and time. He has normal strength. No cranial nerve deficit or sensory deficit. Coordination normal.    Skin: Skin is warm and intact. No rash noted.   Psychiatric: He has a normal mood and affect. His speech is normal and behavior is normal.   Vitals reviewed.       MDM  Number of Diagnoses or Management Options  Gastrointestinal hemorrhage, unspecified gastrointestinal hemorrhage type:   Hyperammonemia ([name]):   Thrombocytopenia [name]):   Diagnosis management comments: [name] is a 64 y.o. Male with known cirrhosis coming in with melena and abd pain. Likely coagulopathic due to cirrhosis. Spoke to GI and will admit. Remained hemodynamically stable here.      Procedures    Vitals:  Patient Vitals for the past 12 hrs:   Temp Pulse Resp BP SpO2   05/17/14 1254 - - - - 100 %   05/17/14 1250 97.7 ??F (36.5 ??C) (!) 105 18 119/77 mmHg 100 %   05/17/14 1245 - (!) 105 14 119/77 mmHg 99 %       Medications ordered:   Medications   ciprofloxacin HCl (CIPRO) tablet 500 mg (not administered)   pantoprazole (PROTONIX) injection 40 mg (not administered)   HYDROmorphone (PF) (DILAUDID) injection 1 mg (not administered)   lactulose (CHRONULAC) solution 10 g (not administered)   HYDROmorphone (PF) (DILAUDID) injection 1 mg (1 mg IntraVENous Given 05/17/14 1319)   ondansetron (ZOFRAN) injection 4 mg (4 mg IntraVENous Given 05/17/14 1319)   sodium chloride 0.9 % bolus infusion 500 mL (0 mL IntraVENous IV Completed 05/17/14 1348)         Lab findings:  Recent Results (from the past 12 hour(s))   CBC WITH AUTOMATED DIFF    Collection Time: 05/17/14 12:40 PM   Result Value Ref Range    WBC 8.1 4.6 - 13.2 K/uL    RBC 4.35 (L) 4.70 - 5.50 M/uL    HGB 13.5 13.0 - 16.0 g/dL    HCT 40.2 36.0 - 48.0 %    MCV 92.4 74.0 - 97.0 FL    MCH 31.0 24.0 - 34.0 PG    MCHC 33.6 31.0 - 37.0 g/dL    RDW 15.1 (H) 11.6 - 14.5 %    PLATELET 57 (L) 135 - 420 K/uL    MPV 11.7 9.2 - 11.8 FL    NEUTROPHILS 71 40 - 73 %    LYMPHOCYTES 13 (L) 21 -  52 %    MONOCYTES 12 (H) 3 - 10 %    EOSINOPHILS 4 0 - 5 %    BASOPHILS 0 0 - 2 %     ABS. NEUTROPHILS 5.7 1.8 - 8.0 K/UL    ABS. LYMPHOCYTES 1.1 0.9 - 3.6 K/UL    ABS. MONOCYTES 1.0 0.05 - 1.2 K/UL    ABS. EOSINOPHILS 0.3 0.0 - 0.4 K/UL    ABS. BASOPHILS 0.0 0.0 - 0.06 K/UL    PLATELET COMMENTS SLIDE ESTIMATE CONFIRMS PLT COUNT      RBC COMMENTS ANISOCYTOSIS  1+        DF AUTOMATED     METABOLIC PANEL, COMPREHENSIVE    Collection Time: 05/17/14 12:40 PM   Result Value Ref Range    Sodium 136 136 - 145 mmol/L    Potassium 4.3 3.5 - 5.5 mmol/L    Chloride 103 100 - 108 mmol/L    CO2 28 21 - 32 mmol/L    Anion gap 5 3.0 - 18 mmol/L    Glucose 142 (H) 74 - 99 mg/dL    BUN 28 (H) 7.0 - 18 MG/DL    Creatinine 0.72 0.6 - 1.3 MG/DL    BUN/Creatinine ratio 39 (H) 12 - 20      GFR est AA >60 >60 ml/min/1.62m    GFR est non-AA >60 >60 ml/min/1.755m   Calcium 7.7 (L) 8.5 - 10.1 MG/DL    Bilirubin, total 1.0 0.2 - 1.0 MG/DL    ALT 70 (H) 16 - 61 U/L    AST 67 (H) 15 - 37 U/L    Alk. phosphatase 99 45 - 117 U/L    Protein, total 7.2 6.4 - 8.2 g/dL    Albumin 3.1 (L) 3.4 - 5.0 g/dL    Globulin 4.1 (H) 2.0 - 4.0 g/dL    A-G Ratio 0.8 0.8 - 1.7     MAGNESIUM    Collection Time: 05/17/14 12:40 PM   Result Value Ref Range    Magnesium 1.8 1.8 - 2.4 mg/dL   PTT    Collection Time: 05/17/14 12:40 PM   Result Value Ref Range    aPTT 30.9 24.6 - 37.7 SEC   PROTHROMBIN TIME + INR    Collection Time: 05/17/14 12:40 PM   Result Value Ref Range    Prothrombin time 16.1 (H) 11.5 - 15.2 sec    INR 1.3 (H) 0.8 - 1.2     CK    Collection Time: 05/17/14 12:40 PM   Result Value Ref Range    CK 509 (H) 39 - 308 U/L   LIPASE    Collection Time: 05/17/14 12:40 PM   Result Value Ref Range    Lipase 302 73 - 393 U/L   TYPE & SCREEN    Collection Time: 05/17/14 12:49 PM   Result Value Ref Range    Crossmatch Expiration 05/20/2014     ABO/Rh(D) A POSITIVE     Antibody screen NEG    AMMONIA    Collection Time: 05/17/14  1:03 PM   Result Value Ref Range    Ammonia 231 (H) 11 - 32 UMOL/L   POC LACTIC ACID     Collection Time: 05/17/14  1:06 PM   Result Value Ref Range    Lactic Acid (POC) 1.4 0.4 - 2.0 mmol/L   EKG, 12 LEAD, INITIAL    Collection Time: 05/17/14  1:12 PM   Result Value Ref Range    Ventricular Rate  101 BPM    Atrial Rate 101 BPM    P-R Interval 112 ms    QRS Duration 76 ms    Q-T Interval 356 ms    QTC Calculation (Bezet) 461 ms    Calculated P Axis 55 degrees    Calculated R Axis 21 degrees    Calculated T Axis 45 degrees    Diagnosis       Sinus tachycardia  Otherwise normal ECG  When compared with ECG of 09-May-2014 14:54,  No significant change was found     URINALYSIS W/ RFLX MICROSCOPIC    Collection Time: 05/17/14  2:16 PM   Result Value Ref Range    Color YELLOW      Appearance CLEAR      Specific gravity 1.017 1.005 - 1.030      pH (UA) 6.5 5.0 - 8.0      Protein NEGATIVE  NEG mg/dL    Glucose NEGATIVE  NEG mg/dL    Ketone NEGATIVE  NEG mg/dL    Bilirubin NEGATIVE  NEG      Blood NEGATIVE  NEG      Urobilinogen 0.2 0.2 - 1.0 EU/dL    Nitrites NEGATIVE  NEG      Leukocyte Esterase NEGATIVE  NEG         Progress notes, Consult notes or additional Procedure notes:   3:02 PM: Spoke to Dr. Lucia Wallace, GI, who recommended PPI, and Ciprofloxacin orally BID and a regular diet and would consult.  3:03 PM: Spoke to Dr. Reche Dixon, Hospitalist, about bed 11. She agreed to admit patient.     Disposition:  Diagnosis:   1. Thrombocytopenia ([place])    2. Gastrointestinal hemorrhage, unspecified gastrointestinal hemorrhage type        Disposition: Admit    Follow-up Information     None           Patient's Medications   Start Taking    No medications on file   Continue Taking    FERROUS SULFATE 325 MG (65 MG IRON) TABLET    Take 1 tablet by mouth Daily (before breakfast).    FOLIC ACID (FOLVITE) 1 MG TABLET    Take 1 tablet by mouth daily.    FUROSEMIDE (LASIX) 40 MG TABLET    Take 1 Tab by mouth daily for 30 days.    MULTIVITAMIN CAPSULE    Take 1 capsule by mouth daily.     NADOLOL (CORGARD) 20 MG TABLET    Take 1 Tab by mouth daily for 30 days.    PANTOPRAZOLE (PROTONIX) 40 MG TABLET    Take 1 Tab by mouth daily for 30 days.    RIFAXIMIN (XIFAXAN) 550 MG TABLET    Take 1 Tab by mouth daily for 30 days.    SPIRONOLACTONE (ALDACTONE) 100 MG TABLET    Take 1 Tab by mouth daily for 30 days.    THIAMINE (B-1) 100 MG TABLET    Take 1 tablet by mouth daily.   These Medications have changed    No medications on file   Stop Taking    No medications on file       Scribe Attestation  I, Octavia Bruckner, am scribing for, and in the presence of Dr. Evelena Leyden MD 12:50 PM, 05/17/2014.    Physician Attestation  I personally performed the services described in the documentation, reviewed the documentation, as recorded by the scribe in my presence, and it accurately and completely records my words  and actions.    Dr. Evelena Leyden MD

## 2014-05-18 LAB — CBC WITH AUTOMATED DIFF
ABS. BASOPHILS: 0 10*3/uL (ref 0.0–0.1)
ABS. EOSINOPHILS: 0.4 10*3/uL (ref 0.0–0.4)
ABS. LYMPHOCYTES: 1.2 10*3/uL (ref 0.9–3.6)
ABS. MONOCYTES: 0.8 10*3/uL (ref 0.05–1.2)
ABS. NEUTROPHILS: 4.2 10*3/uL (ref 1.8–8.0)
BASOPHILS: 1 % (ref 0–2)
EOSINOPHILS: 7 % — ABNORMAL HIGH (ref 0–5)
HCT: 30.7 % — ABNORMAL LOW (ref 36.0–48.0)
HGB: 10.6 g/dL — ABNORMAL LOW (ref 13.0–16.0)
LYMPHOCYTES: 18 % — ABNORMAL LOW (ref 21–52)
MCH: 31.2 PG (ref 24.0–34.0)
MCHC: 34.5 g/dL (ref 31.0–37.0)
MCV: 90.3 FL (ref 74.0–97.0)
MONOCYTES: 13 % — ABNORMAL HIGH (ref 3–10)
MPV: 12.1 FL — ABNORMAL HIGH (ref 9.2–11.8)
NEUTROPHILS: 61 % (ref 40–73)
PLATELET: 55 10*3/uL — ABNORMAL LOW (ref 135–420)
RBC: 3.4 M/uL — ABNORMAL LOW (ref 4.70–5.50)
RDW: 15.2 % — ABNORMAL HIGH (ref 11.6–14.5)
WBC: 6.7 10*3/uL (ref 4.6–13.2)

## 2014-05-18 LAB — EKG, 12 LEAD, INITIAL
Atrial Rate: 101 {beats}/min
Calculated P Axis: 55 degrees
Calculated R Axis: 21 degrees
Calculated T Axis: 45 degrees
P-R Interval: 112 ms
Q-T Interval: 356 ms
QRS Duration: 76 ms
QTC Calculation (Bezet): 461 ms
Ventricular Rate: 101 {beats}/min

## 2014-05-18 LAB — METABOLIC PANEL, BASIC
Anion gap: 6 mmol/L (ref 3.0–18)
BUN/Creatinine ratio: 35 — ABNORMAL HIGH (ref 12–20)
BUN: 27 MG/DL — ABNORMAL HIGH (ref 7.0–18)
CO2: 27 mmol/L (ref 21–32)
Calcium: 7.5 MG/DL — ABNORMAL LOW (ref 8.5–10.1)
Chloride: 106 mmol/L (ref 100–108)
Creatinine: 0.77 MG/DL (ref 0.6–1.3)
GFR est AA: >60 mL/min/{1.73_m2} (ref >60)
GFR est non-AA: >60 mL/min/{1.73_m2} (ref >60)
Glucose: 134 mg/dL — ABNORMAL HIGH (ref 74–99)
Potassium: 3.6 mmol/L (ref 3.5–5.5)
Sodium: 139 mmol/L (ref 136–145)

## 2014-05-18 LAB — HGB & HCT
HCT: 32.5 % — ABNORMAL LOW (ref 36.0–48.0)
HCT: 30 % — ABNORMAL LOW (ref 36.0–48.0)
HGB: 11.2 g/dL — ABNORMAL LOW (ref 13.0–16.0)
HGB: 10.1 g/dL — ABNORMAL LOW (ref 13.0–16.0)

## 2014-05-18 LAB — AMMONIA: Ammonia, plasma: 95 umol/L — ABNORMAL HIGH (ref 11–32)

## 2014-05-18 MED ORDER — ZOLPIDEM 5 MG TAB
5 mg | Freq: Every evening | ORAL | Status: DC | PRN
Start: 2014-05-18 — End: 2014-05-20
  Administered 2014-05-18 – 2014-05-20 (×3): via ORAL

## 2014-05-18 MED ORDER — SODIUM CHLORIDE 0.9 % IJ SYRG
Freq: Three times a day (TID) | INTRAMUSCULAR | Status: DC
Start: 2014-05-18 — End: 2014-05-20
  Administered 2014-05-18 – 2014-05-20 (×8): via INTRAVENOUS

## 2014-05-18 MED ORDER — HYDROMORPHONE (PF) 1 MG/ML IJ SOLN
1 mg/mL | INTRAMUSCULAR | Status: DC | PRN
Start: 2014-05-18 — End: 2014-05-20
  Administered 2014-05-18 – 2014-05-20 (×15): via INTRAVENOUS

## 2014-05-18 MED ORDER — RIFAXIMIN 550 MG TAB
550 mg | Freq: Every day | ORAL | Status: DC
Start: 2014-05-18 — End: 2014-05-20
  Administered 2014-05-18 – 2014-05-20 (×3): via ORAL

## 2014-05-18 MED ORDER — SODIUM CHLORIDE 0.9 % IJ SYRG
INTRAMUSCULAR | Status: DC | PRN
Start: 2014-05-18 — End: 2014-05-20

## 2014-05-18 MED ORDER — FOLIC ACID 1 MG TAB
1 mg | Freq: Every day | ORAL | Status: DC
Start: 2014-05-18 — End: 2014-05-20
  Administered 2014-05-18 – 2014-05-20 (×3): via ORAL

## 2014-05-18 MED ORDER — SPIRONOLACTONE 25 MG TAB
25 mg | Freq: Every day | ORAL | Status: DC
Start: 2014-05-18 — End: 2014-05-20
  Administered 2014-05-18 – 2014-05-20 (×3): via ORAL

## 2014-05-18 MED ORDER — PANTOPRAZOLE 40 MG TAB, DELAYED RELEASE
40 mg | Freq: Every day | ORAL | Status: DC
Start: 2014-05-18 — End: 2014-05-20
  Administered 2014-05-19 – 2014-05-20 (×2): via ORAL

## 2014-05-18 MED ORDER — PNEUMOCOCCAL 23-VALPS VACCINE 25 MCG/0.5 ML INJECTION
25 mcg/0.5 mL | INTRAMUSCULAR | Status: DC
Start: 2014-05-18 — End: 2014-05-20

## 2014-05-18 MED ORDER — LACTULOSE 10 GRAM/15 ML ORAL SOLUTION
10 gram/15 mL | Freq: Three times a day (TID) | ORAL | Status: DC
Start: 2014-05-18 — End: 2014-05-20
  Administered 2014-05-18 – 2014-05-20 (×8): via ORAL

## 2014-05-18 MED ORDER — THIAMINE HCL 100 MG TAB
100 mg | Freq: Every day | ORAL | Status: DC
Start: 2014-05-18 — End: 2014-05-20
  Administered 2014-05-18 – 2014-05-20 (×3): via ORAL

## 2014-05-18 MED ORDER — HYDROMORPHONE 0.5 MG/0.5 ML SYRINGE
0.5 mg/ mL | INTRAMUSCULAR | Status: DC | PRN
Start: 2014-05-18 — End: 2014-05-18
  Administered 2014-05-18 (×2): via INTRAVENOUS

## 2014-05-18 MED ORDER — NADOLOL 20 MG TAB
20 mg | Freq: Every day | ORAL | Status: DC
Start: 2014-05-18 — End: 2014-05-20
  Administered 2014-05-18 – 2014-05-20 (×3): via ORAL

## 2014-05-18 MED FILL — HYDROMORPHONE 0.5 MG/0.5 ML SYRINGE: 0.5 mg/ mL | INTRAMUSCULAR | Qty: 1

## 2014-05-18 MED FILL — SPIRONOLACTONE 25 MG TAB: 25 mg | ORAL | Qty: 4

## 2014-05-18 MED FILL — PROTONIX 40 MG INTRAVENOUS SOLUTION: 40 mg | INTRAVENOUS | Qty: 40

## 2014-05-18 MED FILL — LACTULOSE 20 GRAM/30 ML ORAL SOLUTION: 20 gram/30 mL | ORAL | Qty: 30

## 2014-05-18 MED FILL — HYDROMORPHONE (PF) 1 MG/ML IJ SOLN: 1 mg/mL | INTRAMUSCULAR | Qty: 1

## 2014-05-18 MED FILL — FOLIC ACID 1 MG TAB: 1 mg | ORAL | Qty: 1

## 2014-05-18 MED FILL — CIPROFLOXACIN 500 MG TAB: 500 mg | ORAL | Qty: 1

## 2014-05-18 MED FILL — BD POSIFLUSH NORMAL SALINE 0.9 % INJECTION SYRINGE: INTRAMUSCULAR | Qty: 10

## 2014-05-18 MED FILL — XIFAXAN 550 MG TABLET: 550 mg | ORAL | Qty: 1

## 2014-05-18 MED FILL — ZOLPIDEM 5 MG TAB: 5 mg | ORAL | Qty: 1

## 2014-05-18 MED FILL — NADOLOL 20 MG TAB: 20 mg | ORAL | Qty: 2

## 2014-05-18 MED FILL — VITAMIN B-1 100 MG TABLET: 100 mg | ORAL | Qty: 1

## 2014-05-18 NOTE — Consults (Signed)
[facility]   [email or web address]      Impression:   1. Cirrhoisis, ETOH and Hep C.  2. GI Bleed w hx of varices.  3. Pror HE, on lactulose but not compliant.      Plan:     1. EGD tomorrow. Informed Consent: The risks, benefits and alternatives of the procedure and the sedation options were discussed in detail. The patient is aware of potential complications including but not limited to bleeding, perforation, aspiration, infection, sedation adverse events, missed diagnosis, missed lesions, intravenous site complications, potential need for additional procedures, and death. All questions were answered. Informed consent is documented on medical record.  2. Have social work see him to assist w meds.  3. Resume PPI daily.  4. Cont Lactulose (have been working on [employer] as OP, he still needs to provide some documentation to complete.)  5. Diuretics, Aldactone  daily plus Lasix  daily.  6. Nadalol or equiv for variceal prophylaxis.      Chief Complaint: melena      HPI:  [name] is a 64 y.o. male who is being seen on consult for melena and cirrhosis. No nausea, black stools cont. Last EGD few mos ago free of varices although he has had EBL in past. He has not been taking his meds as directed, finances are a barrier. No ETOH since Dec 2015. Tol PO know, no f/c, no confusion or signs of encephalopathy. Ascites better, has had therapeutic paracentesis multiple times in past.    PMH:   Past Medical History   Diagnosis Date   ??? Hypertension    ??? Ill-defined condition      anemia   ??? Anemia    ??? Varices    ??? Hepatitis C virus    ??? GI bleed    ??? Alcohol abuse    ??? Hernia        PSH:   Past Surgical History   Procedure Laterality Date   ??? Upper gi endoscopy,ligat varix  01/20/2013             Social HX:   History     Social History   ??? Marital Status: SINGLE     Spouse Name: N/A   ??? Number of Children: N/A   ??? Years of Education: N/A      Occupational History   ??? Not on file.     Social History Main Topics   ??? Smoking status: Former Smoker   ??? Smokeless tobacco: Never Used   ??? Alcohol Use: No      Comment: quit drinking December 2014   ??? Drug Use: No   ??? Sexual Activity: No     Other Topics Concern   ??? Not on file     Social History Narrative       FHX:   Family History   Problem Relation Age of Onset   ??? Heart Disease Father 8     MI   ??? Heart Disease Brother        Allergy:   No Known Allergies    Home Medications:     Prescriptions prior to admission   Medication Sig   ??? lactulose (CHRONULAC) 10 gram/15 mL solution Take 10 g by mouth three (3) times daily.   ??? spironolactone (ALDACTONE) 100 mg tablet Take 1 Tab by mouth daily for 30 days.   ??? furosemide (LASIX) 40 mg tablet Take 1 Tab by mouth daily  for 30 days.   ??? nadolol (CORGARD) 20 mg tablet Take 1 Tab by mouth daily for 30 days.   ??? rifaximin (XIFAXAN) 550 mg tablet Take 1 Tab by mouth daily for 30 days.   ??? pantoprazole (PROTONIX) 40 mg tablet Take 1 Tab by mouth daily for 30 days.   ??? folic acid (FOLVITE) 1 mg tablet Take 1 tablet by mouth daily.   ??? multivitamin capsule Take 1 capsule by mouth daily.   ??? thiamine (B-1) 100 mg tablet Take 1 tablet by mouth daily.   ??? ferrous sulfate 325 mg (65 mg iron) tablet Take 1 tablet by mouth Daily (before breakfast).       Review of Systems:     Constitutional: No fevers, chills, weight loss, fatigue.   Skin: No rashes, pruritis, jaundice, ulcerations, erythema.   HENT: No headaches, nosebleeds, sinus pressure, rhinorrhea, sore throat.   Eyes: No visual changes, blurred vision, eye pain, photophobia, jaundice.   Cardiovascular: No chest pain, heart palpitations.   Respiratory: No cough, SOB, wheezing, chest discomfort, orthopnea.   Gastrointestinal: melena   Genitourinary: No dysuria, bleeding, discharge, pyuria.   Musculoskeletal: No weakness, arthralgias, wasting.   Endo: No sweats.   Heme: No bruising, easy bleeding.   Allergies: As noted.    Neurological: Cranial nerves intact.  Alert and oriented. Gait not assessed.   Psychiatric:  No anxiety, depression, hallucinations.                 BP 103/78 mmHg   Pulse 95   Temp(Src) 97.9 ??F (36.6 ??C)   Resp 18   Ht 5\' 10"  (1.778 m)   Wt 109 kg (240 lb 4.8 oz)   BMI 34.48 kg/m2   SpO2 94%    Physical Assessment:     constitutional: appearance: well developed, well nourished, normal habitus, no deformities, in no acute distress.   skin: inspection: no rashes, ulcers, icterus or other lesions; no clubbing or telangiectasias. palpation: no induration or subcutaneos nodules.   eyes: inspection: normal conjunctivae and lids; no jaundice pupils: symmetrical, normoreactive to light, normal accommodation and size.   ENMT: mouth: normal oral mucosa,lips and gums; good dentition. oropharynx: normal tongue, hard and soft palate; posterior pharynx without erithema, exudate or lesions.   neck: thyroid: normal size, consistency and position; no masses or tenderness.   respiratory: effort: normal chest excursion; no intercostal retraction or accessory muscle use.   cardiovascular: abdominal aorta: normal size and position; no bruits. palpation: PMI of normal size and position; normal rhythm; no thrill or murmurs.   abdominal: abdomen: normal consistency; no tenderness or masses. hernias: no hernias appreciated. liver: normal size and consistency. spleen: not palpable.   rectal: hemoccult/guaiac: not performed.   musculoskeletal: digits and nails: no clubbing, cyanosis, petechiae or other inflammatory conditions. gait: normal gait and station head and neck: normal range of motion; no pain, crepitation or contracture. spine/ribs/pelvis: normal range of motion; no pain, deformity or contracture.   lymphatic: axilae: not palpable. groin: not palpable. neck: within normal limits. other: not palpable.   neurologic: cranial nerves: II-XII normal.   psychiatric: judgement/insight: within normal limits. memory: within  normal limits for recent and remote events. mood and affect: no evidence of depression, anxiety or agitation. orientation: oriented to time, space and person.        Basic Metabolic Profile   Recent Labs      05/18/14   0301  05/17/14   1240   NA  139  136  K  3.6  4.3   CL  106  103   CO2  27  28   BUN  27*  28*   GLU  134*  142*   CA  7.5*  7.7*   MG   --   1.8         CBC w/Diff    Recent Labs      05/18/14   1029  05/18/14   0301   WBC   --   6.7   RBC   --   3.40*   HGB  10.1*  10.6*   HCT  30.0*  30.7*   MCV   --   90.3   MCH   --   31.2   MCHC   --   34.5   RDW   --   15.2*   PLT   --   55*    Recent Labs      05/18/14   0301   GRANS  61   LYMPH  18*   EOS  7*        Hepatic Function   Recent Labs      05/17/14   1240   ALB  3.1*   TP  7.2   TBILI  1.0   SGOT  67*   AP  99   LPSE  302          Gladiola Madore A Shemicka Cohrs, MD, M.D.   [facility] [place]  [phone]  [email or web address]

## 2014-05-18 NOTE — Other (Signed)
Bedside and Verbal shift change report given to Amy, RN (oncoming nurse) by Lurline Idol Ward, RN (offgoing nurse). Report included the following information SBAR, Kardex, MAR and Recent Results.    SITUATION:   ? Code Status: Full Code  ? Reason for Admission: GI bleed  ? GI bleed    ? Hospital day: 1  ? Problem List:       Hospital Problems  Date Reviewed: 2014-02-19          Codes Class Noted POA    GI bleed ICD-10-CM: K92.2  ICD-9-CM: 578.9  01/20/2013 Unknown              BACKGROUND:    Past Medical History:   Past Medical History   Diagnosis Date   ??? Hypertension    ??? Ill-defined condition      anemia   ??? Anemia    ??? Varices    ??? Hepatitis C virus    ??? GI bleed    ??? Alcohol abuse    ??? Hernia          Patient taking anticoagulants no     ASSESSMENT:   ? Changes in Assessment Throughout Shift: none    ? Patient has Central Line: no Reasons if yes:   ? Patient has Foley Cath: no Reasons if yes:      ? Last Vitals:     Filed Vitals:    05/18/14 1634   BP: 115/79   Pulse: 88   Temp: 97.8 ??F (36.6 ??C)   Resp: 18   Height:    Weight:    SpO2: 97%       ? IV and DRAINS (will only show if present)   Peripheral IV 05/17/14 Left Antecubital-Site Assessment: Clean, dry, & intact    ? WOUND (if present)   Wound Type:  none   Dressing present Dressing Present : No   Wound Concerns/Notes:  none    ? PAIN    Pain Assessment    Pain Intensity 1: 0 (05/18/14 1342)    Pain Location 1: Abdomen    Pain Intervention(s) 1: Medication (see MAR)    Patient Stated Pain Goal: 0  o Interventions for Pain:  none  o Intervention effective: no  o Time of last intervention: dilaudid Q4   o Reassessment Completed: yes     ? Last 3 Weights:  Last 3 Recorded Weights in this Encounter    05/17/14 1250 05/18/14 0431   Weight: 108.863 kg (240 lb) 109 kg (240 lb 4.8 oz)     Weight change:     ? INTAKE/OUPUT    Current Shift:      Last three shifts: 03/25 1901 - 03/27 0700  In: 720 [P.O.:720]  Out: 801 [Urine:800]    ? LAB RESULTS     Recent Labs       05/18/14   1029  05/18/14   0301  05/17/14   2150  05/17/14   1240   WBC   --   6.7   --   8.1   HGB  10.1*  10.6*  11.2*  13.5   HCT  30.0*  30.7*  32.5*  40.2   PLT   --   55*   --   57*        Recent Labs      05/18/14   0301  05/17/14   1240   NA  139  136   K  3.6  4.3   GLU  134*  142*   BUN  27*  28*   CREA  0.77  0.72   CA  7.5*  7.7*   MG   --   1.8   INR   --   1.3*       RECOMMENDATIONS AND DISCHARGE PLANNING     1. Pending tests/procedures/ Plan of Care or Other Needs: EGD      2. Discharge plan for patient and Needs/Barriers: none noted    3. Estimated Discharge Date: 05/18/2014 Posted on Whiteboard in Patient???s Room: yes      4. The patient's care plan was reviewed with the oncoming nurse.       "HEALS" SAFETY CHECK      Fall Risk    Total Score: 2    Safety Measures: Safety Measures: Bed/Chair-Wheels locked, Bed in low position, Call light within reach, Side rails X 3    A safety check occurred in the patient's room between off going nurse and oncoming nurse listed above.    The safety check included the below items  Area Items   H  High Alert Medications ? Verify all high alert medication drips (heparin, PCA, etc.)   E  Equipment ? Suction is set up for ALL patients (with yanker)  ? Red plugs utilized for all equipment (IV pumps, etc.)  ? WOW???s wiped down at end of shift.  ? Room stocked with oxygen, suction, and other unit-specific supplies   A  Alarms ? Bed alarm is set for fall risk patients  ? Ensure chair alarm is in place and activated if patient is up in a chair   L  Lines ? Check IV for any infiltration  ? Foley bag is empty if patient has a Foley   ? Tubing and IV bags are labeled   S  Safety   ? Room is clean, patient is clean, and equipment is clean.  ? Hallways are clear from equipment besides carts.   ? Fall bracelet on for fall risk patients  ? Ensure room is clear and free of clutter  ? Suction is set up for ALL patients (with yanker)   ? Hallways are clear from equipment besides carts.   ? Isolation precautions followed, supplies available outside room, sign posted     Lurline Idolashawna D Ward, RN

## 2014-05-18 NOTE — Progress Notes (Signed)
[facility] Hospitalist Group  Progress Note    Patient: [name] Age: 64 y.o. DOB: Jul 08, 1950 MR#: [id number] SSN: [id number]  Date/Time: 05/18/2014 1:49 PM    Subjective:     Continues to have melena    Objective:   VS: BP 103/78 mmHg   Pulse 95   Temp(Src) 97.9 ??F (36.6 ??C)   Resp 18   Ht  (1.778 m)   Wt 109 kg (240 lb 4.8 oz)   BMI 34.48 kg/m2   SpO2 94%   Tmax/24hrs: Temp (24hrs), Avg:97.9 ??F (36.6 ??C), Min:97.9 ??F (36.6 ??C), Max:97.9 ??F (36.6 ??C)     Input/Output:   Intake/Output Summary (Last 24 hours) at 05/18/14 1349  Last data filed at 05/18/14 0431   Gross per 24 hour   Intake    720 ml   Output    801 ml   Net    -81 ml       General:  nad  Cardiovascular:  s1s2  Pulmonary:  clear  GI:  benign  Extremities:  No edema  Additional:  No skin changes  Neuro:  Non focal    Labs:    Recent Results (from the past 24 hour(s))   URINALYSIS W/ RFLX MICROSCOPIC    Collection Time: 05/17/14  2:16 PM   Result Value Ref Range    Color YELLOW      Appearance CLEAR      Specific gravity 1.017 1.005 - 1.030      pH (UA) 6.5 5.0 - 8.0      Protein NEGATIVE  NEG mg/dL    Glucose NEGATIVE  NEG mg/dL    Ketone NEGATIVE  NEG mg/dL    Bilirubin NEGATIVE  NEG      Blood NEGATIVE  NEG      Urobilinogen 0.2 0.2 - 1.0 EU/dL    Nitrites NEGATIVE  NEG      Leukocyte Esterase NEGATIVE  NEG     HGB & HCT    Collection Time: 05/17/14  9:50 PM   Result Value Ref Range    HGB 11.2 (L) 13.0 - 16.0 g/dL    HCT 78.2 (L) 95.6 - 48.0 %   CBC WITH AUTOMATED DIFF    Collection Time: 05/18/14  3:01 AM   Result Value Ref Range    WBC 6.7 4.6 - 13.2 K/uL    RBC 3.40 (L) 4.70 - 5.50 M/uL    HGB 10.6 (L) 13.0 - 16.0 g/dL    HCT 21.3 (L) 08.6 - 48.0 %    MCV 90.3 74.0 - 97.0 FL    MCH 31.2 24.0 - 34.0 PG    MCHC 34.5 31.0 - 37.0 g/dL    RDW 57.8 (H) 46.9 - 14.5 %    PLATELET 55 (L) 135 - 420 K/uL    MPV 12.1 (H) 9.2 - 11.8 FL    NEUTROPHILS 61 40 - 73 %    LYMPHOCYTES 18 (L) 21 - 52 %     MONOCYTES 13 (H) 3 - 10 %    EOSINOPHILS 7 (H) 0 - 5 %    BASOPHILS 1 0 - 2 %    ABS. NEUTROPHILS 4.2 1.8 - 8.0 K/UL    ABS. LYMPHOCYTES 1.2 0.9 - 3.6 K/UL    ABS. MONOCYTES 0.8 0.05 - 1.2 K/UL    ABS. EOSINOPHILS 0.4 0.0 - 0.4 K/UL    ABS. BASOPHILS 0.0 0.0 - 0.1 K/UL    DF  AUTOMATED     METABOLIC PANEL, BASIC    Collection Time: 05/18/14  3:01 AM   Result Value Ref Range    Sodium 139 136 - 145 mmol/L    Potassium 3.6 3.5 - 5.5 mmol/L    Chloride 106 100 - 108 mmol/L    CO2 27 21 - 32 mmol/L    Anion gap 6 3.0 - 18 mmol/L    Glucose 134 (H) 74 - 99 mg/dL    BUN 27 (H) 7.0 - 18 MG/DL    Creatinine 6.960.77 0.6 - 1.3 MG/DL    BUN/Creatinine ratio 35 (H) 12 - 20      GFR est AA >60 >60 ml/min/1.2173m2    GFR est non-AA >60 >60 ml/min/1.4373m2    Calcium 7.5 (L) 8.5 - 10.1 MG/DL   AMMONIA    Collection Time: 05/18/14  3:01 AM   Result Value Ref Range    Ammonia 95 (H) 11 - 32 UMOL/L   HGB & HCT    Collection Time: 05/18/14 10:29 AM   Result Value Ref Range    HGB 10.1 (L) 13.0 - 16.0 g/dL    HCT 29.530.0 (L) 28.436.0 - 48.0 %     Additional Data Reviewed:      Current Facility-Administered Medications   Medication Dose Route Frequency   ??? HYDROmorphone (PF) (DILAUDID) injection 1 mg  1 mg IntraVENous Q4H PRN   ??? [START ON 05/19/2014] pantoprazole (PROTONIX) tablet 40 mg  40 mg Oral ACB   ??? ciprofloxacin HCl (CIPRO) tablet 500 mg  500 mg Oral Q12H   ??? lactulose (CHRONULAC) solution 10 g  15 mL Oral TID   ??? folic acid (FOLVITE) tablet 1 mg  1 mg Oral DAILY   ??? rifaximin (XIFAXAN) tablet 550 mg  550 mg Oral DAILY   ??? spironolactone (ALDACTONE) tablet 100 mg  100 mg Oral DAILY   ??? thiamine (B-1) tablet 100 mg  100 mg Oral DAILY   ??? sodium chloride (NS) flush 5-10 mL  5-10 mL IntraVENous Q8H   ??? sodium chloride (NS) flush 5-10 mL  5-10 mL IntraVENous PRN   ??? pneumococcal 23-valent (PNEUMOVAX 23) injection 0.5 mL  0.5 mL IntraMUSCular PRIOR TO DISCHARGE   ??? zolpidem (AMBIEN) tablet 5 mg  5 mg Oral QHS PRN       Assessment/Plan:      Patient Active Problem List   Diagnosis Code   ??? Alcoholic cirrhosis of liver (HCC) K70.30   ??? Hepatitis C B19.20   ??? Thrombocytopenia, unspecified (HCC) D69.6   ??? Alcohol abuse F10.10   ??? Anemia D64.9   ??? Hematemesis K92.0   ??? Melena K92.1   ??? Thrombocytopenia (HCC) D69.6   ??? Alcoholic cirrhosis (HCC) K70.30   ??? Chest pain R07.9   ??? GI bleed K92.2   ??? Acute upper GI bleeding K92.2   ??? 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 Cirrhosis of liver with ascites (HCC) K74.60       Plan:    Cont to monitor h/h and transfuse  Cont lactulose and xifaxan  Lasix, aldactone  Appreciate input of gi     Case discussed with:  Patient  Family  Nursing  Case Management  DVT Prophylaxis:  Lovenox  Hep SQ  SCDs  Coumadin   On Heparin gtt    Signed By: Chauncey ReadingMICHAEL S Braylin Formby, MD

## 2014-05-18 NOTE — Other (Addendum)
Assumed patient care. Patient in bed, awake, alert and oriented x3. Denies pain or discomforts at this time. Call light placed within reach. Bed in low position.    0032-Sleeping with no signs of pain. NPO status maintained.

## 2014-05-18 NOTE — Other (Signed)
Bedside and Verbal shift change report given to R.Ward (oncoming nurse) by Lucretia Field, RN (offgoing nurse). Report included the following information SBAR, Kardex, MAR and Recent Results.    SITUATION:   ? Code Status: Full Code  Reason for Admission: GI bleed  ? GI bleed    ? Hospital day: 1  ? Problem List:       Hospital Problems  Date Reviewed: Feb 12, 2014          Codes Class Noted POA    GI bleed ICD-10-CM: K92.2  ICD-9-CM: 578.9  01/20/2013 Unknown              BACKGROUND:    Past Medical History:   Past Medical History   Diagnosis Date   ??? Hypertension    ??? Ill-defined condition      anemia   ??? Anemia    ??? Varices    ??? Hepatitis C virus    ??? GI bleed    ??? Alcohol abuse    ??? Hernia          Patient taking anticoagulants no     ASSESSMENT:   ? Changes in Assessment Throughout Shift: had frequent stools, black in color. Pain to abdomen; legs and back relieved with dilaudid IV    ? Patient has Central Line: no Reasons if yes: n/a  ? Patient has Foley Cath: no Reasons if yes: n/a     ? Last Vitals:     Filed Vitals:    05/18/14 0431   BP: 98/70   Pulse: 99   Temp: 97.9 ??F (36.6 ??C)   Resp: 20   Height:    Weight: 109 kg (240 lb 4.8 oz)   SpO2: 96%       ? IV and DRAINS (will only show if present)   Peripheral IV 05/17/14 Left Antecubital-Site Assessment: Clean, dry, & intact    ? WOUND (if present)   Wound Type:  none   Dressing present Dressing Present : No   Wound Concerns/Notes:  none    ? PAIN    Pain Assessment    Pain Intensity 1: 0 (05/18/14 0418)    Pain Location 1: Abdomen    Pain Intervention(s) 1: Medication (see MAR)    Patient Stated Pain Goal: Unable to verbalize/indicatate pain  o Interventions for Pain:  dilaudid  o Intervention effective: yes  o Time of last intervention: 0631   o Reassessment Completed: yes    ? Last 3 Weights:  Last 3 Recorded Weights in this Encounter    05/17/14 1250 05/18/14 0431   Weight: 108.863 kg (240 lb) 109 kg (240 lb 4.8 oz)     Weight change:     ? INTAKE/OUPUT     Current Shift:      Last three shifts: 03/25 1901 - 03/27 0700  In: 720 [P.O.:720]  Out: 801 [Urine:800]    ? LAB RESULTS     Recent Labs      05/18/14   0301  05/17/14   2150  05/17/14   1240   WBC  6.7   --   8.1   HGB  10.6*  11.2*  13.5   HCT  30.7*  32.5*  40.2   PLT  55*   --   57*        Recent Labs      05/18/14   0301  05/17/14   1240   NA  139  136   K  3.6  4.3   GLU  134*  142*   BUN  27*  28*   CREA  0.77  0.72   CA  7.5*  7.7*   MG   --   1.8   INR   --   1.3*       RECOMMENDATIONS AND DISCHARGE PLANNING     1. Pending tests/procedures/ Plan of Care or Other Needs: monitor H/H every 6 hours and monitor for bleeding    2. Discharge plan for patient and Needs/Barriers: home when ready    3. Estimated Discharge Date: 05/20/14 Posted on Whiteboard in Patient???s Room: yes      4. The patient's care plan was reviewed with the oncoming nurse.       "HEALS" SAFETY CHECK      Fall Risk    Total Score: 2    Safety Measures: Safety Measures: Bed/Chair-Wheels locked, Bed in low position, Call light within reach, Side rails X 3    A safety check occurred in the patient's room between off going nurse and oncoming nurse listed above.    The safety check included the below items  Area Items   H  High Alert Medications ? Verify all high alert medication drips (heparin, PCA, etc.)   E  Equipment ? Suction is set up for ALL patients (with yanker)  ? Red plugs utilized for all equipment (IV pumps, etc.)  ? WOW???s wiped down at end of shift.  ? Room stocked with oxygen, suction, and other unit-specific supplies   A  Alarms ? Bed alarm is set for fall risk patients  ? Ensure chair alarm is in place and activated if patient is up in a chair   L  Lines ? Check IV for any infiltration  ? Foley bag is empty if patient has a Foley   ? Tubing and IV bags are labeled   S  Safety   ? Room is clean, patient is clean, and equipment is clean.  ? Hallways are clear from equipment besides carts.    ? Fall bracelet on for fall risk patients  ? Ensure room is clear and free of clutter  ? Suction is set up for ALL patients (with yanker)  ? Hallways are clear from equipment besides carts.   ? Isolation precautions followed, supplies available outside room, sign posted     Lucretia FieldAmelia Y Mitsuko Luera, RN

## 2014-05-19 LAB — CBC WITH AUTOMATED DIFF
ABS. BASOPHILS: 0 10*3/uL (ref 0.0–0.1)
ABS. EOSINOPHILS: 0.6 10*3/uL — ABNORMAL HIGH (ref 0.0–0.4)
ABS. LYMPHOCYTES: 0.8 10*3/uL — ABNORMAL LOW (ref 0.9–3.6)
ABS. MONOCYTES: 0.8 10*3/uL (ref 0.05–1.2)
ABS. NEUTROPHILS: 2.9 10*3/uL (ref 1.8–8.0)
BASOPHILS: 1 % (ref 0–2)
EOSINOPHILS: 12 % — ABNORMAL HIGH (ref 0–5)
HCT: 27.1 % — ABNORMAL LOW (ref 36.0–48.0)
HGB: 9.3 g/dL — ABNORMAL LOW (ref 13.0–16.0)
LYMPHOCYTES: 15 % — ABNORMAL LOW (ref 21–52)
MCH: 31.4 PG (ref 24.0–34.0)
MCHC: 34.3 g/dL (ref 31.0–37.0)
MCV: 91.6 FL (ref 74.0–97.0)
MONOCYTES: 16 % — ABNORMAL HIGH (ref 3–10)
MPV: 12.6 FL — ABNORMAL HIGH (ref 9.2–11.8)
NEUTROPHILS: 56 % (ref 40–73)
PLATELET: 47 10*3/uL — ABNORMAL LOW (ref 135–420)
RBC: 2.96 M/uL — ABNORMAL LOW (ref 4.70–5.50)
RDW: 15.4 % — ABNORMAL HIGH (ref 11.6–14.5)
WBC: 5.1 10*3/uL (ref 4.6–13.2)

## 2014-05-19 LAB — METABOLIC PANEL, BASIC
Anion gap: 4 mmol/L (ref 3.0–18)
BUN/Creatinine ratio: 29 — ABNORMAL HIGH (ref 12–20)
BUN: 21 MG/DL — ABNORMAL HIGH (ref 7.0–18)
CO2: 28 mmol/L (ref 21–32)
Calcium: 7.5 MG/DL — ABNORMAL LOW (ref 8.5–10.1)
Chloride: 108 mmol/L (ref 100–108)
Creatinine: 0.72 MG/DL (ref 0.6–1.3)
GFR est AA: >60 mL/min/{1.73_m2} (ref >60)
GFR est non-AA: >60 mL/min/{1.73_m2} (ref >60)
Glucose: 102 mg/dL — ABNORMAL HIGH (ref 74–99)
Potassium: 4 mmol/L (ref 3.5–5.5)
Sodium: 140 mmol/L (ref 136–145)

## 2014-05-19 MED ORDER — PROPOFOL INFUSION
INTRAVENOUS | Status: DC | PRN
Start: 2014-05-19 — End: 2014-05-19
  Administered 2014-05-19: 18:00:00 via INTRAVENOUS

## 2014-05-19 MED ORDER — SODIUM CHLORIDE 0.9 % IJ SYRG
INTRAMUSCULAR | Status: DC | PRN
Start: 2014-05-19 — End: 2014-05-19

## 2014-05-19 MED ORDER — LACTATED RINGERS IV
INTRAVENOUS | Status: DC
Start: 2014-05-19 — End: 2014-05-19
  Administered 2014-05-19: 18:00:00 via INTRAVENOUS

## 2014-05-19 MED ORDER — FENTANYL CITRATE (PF) 50 MCG/ML IJ SOLN
50 mcg/mL | INTRAMUSCULAR | Status: DC | PRN
Start: 2014-05-19 — End: 2014-05-19

## 2014-05-19 MED ORDER — LACTATED RINGERS IV
INTRAVENOUS | Status: DC
Start: 2014-05-19 — End: 2014-05-20
  Administered 2014-05-19 – 2014-05-20 (×2): via INTRAVENOUS

## 2014-05-19 MED ORDER — SODIUM CHLORIDE 0.9 % IJ SYRG
INTRAMUSCULAR | Status: DC | PRN
Start: 2014-05-19 — End: 2014-05-20
  Administered 2014-05-19: 20:00:00 via INTRAVENOUS

## 2014-05-19 MED ORDER — SODIUM CHLORIDE 0.9 % IJ SYRG
Freq: Three times a day (TID) | INTRAMUSCULAR | Status: DC
Start: 2014-05-19 — End: 2014-05-19

## 2014-05-19 MED ORDER — FAMOTIDINE (PF) 20 MG/2 ML IV
20 mg/2 mL | Freq: Once | INTRAVENOUS | Status: DC
Start: 2014-05-19 — End: 2014-05-19

## 2014-05-19 MED FILL — HYDROMORPHONE (PF) 1 MG/ML IJ SOLN: 1 mg/mL | INTRAMUSCULAR | Qty: 1

## 2014-05-19 MED FILL — BD POSIFLUSH NORMAL SALINE 0.9 % INJECTION SYRINGE: INTRAMUSCULAR | Qty: 10

## 2014-05-19 MED FILL — SPIRONOLACTONE 25 MG TAB: 25 mg | ORAL | Qty: 4

## 2014-05-19 MED FILL — LACTATED RINGERS IV: INTRAVENOUS | Qty: 1000

## 2014-05-19 MED FILL — FOLIC ACID 1 MG TAB: 1 mg | ORAL | Qty: 1

## 2014-05-19 MED FILL — VITAMIN B-1 100 MG TABLET: 100 mg | ORAL | Qty: 1

## 2014-05-19 MED FILL — PANTOPRAZOLE 40 MG TAB, DELAYED RELEASE: 40 mg | ORAL | Qty: 1

## 2014-05-19 MED FILL — CIPROFLOXACIN 500 MG TAB: 500 mg | ORAL | Qty: 1

## 2014-05-19 MED FILL — LACTULOSE 20 GRAM/30 ML ORAL SOLUTION: 20 gram/30 mL | ORAL | Qty: 30

## 2014-05-19 MED FILL — NADOLOL 20 MG TAB: 20 mg | ORAL | Qty: 2

## 2014-05-19 MED FILL — ZOLPIDEM 5 MG TAB: 5 mg | ORAL | Qty: 1

## 2014-05-19 MED FILL — XIFAXAN 550 MG TABLET: 550 mg | ORAL | Qty: 1

## 2014-05-19 NOTE — Anesthesia Pre-Procedure Evaluation (Signed)
Anesthetic History     PONV          Review of Systems / Medical History  Patient summary reviewed and pertinent labs reviewed    Pulmonary      Recent URI: resolved    Shortness of breath         Neuro/Psych   Within defined limits           Cardiovascular    Hypertension: poorly controlled      CHF: NYHA Classification II, dyspnea on exertion    Hyperlipidemia    Exercise tolerance: <4 METS     GI/Hepatic/Renal     GERD: poorly controlled      Liver disease    Comments: Hep C ETOH hepatitis  Endo/Other        Anemia     Other Findings   Comments: Abdominal pain 10/10     Very low PLts 47K today    Coagulopathy            Physical Exam    Airway  Mallampati: II  TM Distance: 4 - 6 cm  Neck ROM: normal range of motion   Mouth opening: Normal     Cardiovascular    Rhythm: regular  Rate: normal         Dental    Dentition: Poor dentition  Comments: Several missing teeth   Pulmonary  Breath sounds clear to auscultation               Abdominal  GI exam deferred       Other Findings            Anesthetic Plan    ASA: 3  Anesthesia type: MAC          Induction: Intravenous  Anesthetic plan and risks discussed with: Patient

## 2014-05-19 NOTE — Progress Notes (Signed)
Chaplain conducted an initial consultation and Spiritual Assessment for [name], who is a 64 y.o.,male. Patient???s Primary Language is: [place].   According to the patient???s EMR Religious Affiliation is: [place].     The reason the Patient came to the hospital is:   Patient Active Problem List    Diagnosis Date Noted   ??? Cirrhosis of liver with ascites (HCC) 05/09/2014   ??? Varices, esophageal (HCC) 03/25/2013   ??? GI bleed 01/20/2013   ??? Acute upper GI bleeding 01/20/2013   ??? Hematemesis 01/19/2013   ??? Melena 01/19/2013   ??? Thrombocytopenia (HCC) 01/19/2013   ??? Alcoholic cirrhosis (HCC) 01/19/2013   ??? Chest pain 01/19/2013   ??? Anemia 11/08/2012   ??? Alcohol abuse 03/11/2012   ??? Thrombocytopenia, unspecified (HCC) 03/10/2012   ??? Hepatitis C 12/23/2011   ??? Alcoholic cirrhosis of liver (HCC) 07/26/2011        The Chaplain provided the following Interventions:  Initiated a relationship of care and support.   Listened empathically.  Provided information about Spiritual Care Services.  Offered prayer and assurance of continued prayers on patient's behalf.   Chart reviewed.    The following outcomes where achieved:  Chaplain confirmed Patient's Religious Affiliation.  Patient expressed gratitude for chaplain's visit.    Assessment:  Patient does not have any religious/cultural needs that will affect patient???s preferences in health care.  There are no spiritual or religious issues which require intervention at this time.     Plan:  Chaplains will continue to follow and will provide pastoral care on an as needed/requested basis.  Chaplain recommends bedside caregivers page chaplain on duty if patient shows signs of acute spiritual or emotional distress.    Chaplain Eilene GhaziKerry Pokines  Spiritual Care  [phone])

## 2014-05-19 NOTE — Other (Signed)
Bedside and Verbal shift change report given to Kevin, RN (oncoming nurse) by Yvonne, RN (offgoing nurse). Report included the following information SBAR and Kardex.

## 2014-05-19 NOTE — Other (Signed)
Bedside and Verbal shift change report given to R.Ward (oncoming nurse) by Lucretia FieldAmelia Y Blanchie Zeleznik, RN (offgoing nurse). Report included the following information SBAR, Kardex, MAR and Recent Results.    SITUATION:   ? Code Status: Full Code  Reason for Admission: GI bleed  ? GI bleed    ? Hospital day: 2  ? Problem List:       Hospital Problems  Date Reviewed: 01/22/2014          Codes Class Noted POA    GI bleed ICD-10-CM: K92.2  ICD-9-CM: 578.9  01/20/2013 Unknown              BACKGROUND:    Past Medical History:   Past Medical History   Diagnosis Date   ??? Hypertension    ??? Ill-defined condition      anemia   ??? Anemia    ??? Varices    ??? Hepatitis C virus    ??? GI bleed    ??? Alcohol abuse    ??? Hernia          Patient taking anticoagulants no     ASSESSMENT:   ? Changes in Assessment Throughout Shift: NPO after midnight maintained for EGD today    ? Patient has Central Line: no Reasons if yes: n/a  ? Patient has Foley Cath: no Reasons if yes: n/a     ? Last Vitals:     Filed Vitals:    05/19/14 0620   BP:    Pulse:    Temp:    Resp:    Height:    Weight: 111.086 kg (244 lb 14.4 oz)   SpO2:        ? IV and DRAINS (will only show if present)   Peripheral IV 05/17/14 Left Antecubital-Site Assessment: Clean, dry, & intact    ? WOUND (if present)   Wound Type:  none   Dressing present Dressing Present : No   Wound Concerns/Notes:  none    ? PAIN    Pain Assessment    Pain Intensity 1: 8 (05/19/14 0700)    Pain Location 1: Abdomen    Pain Intervention(s) 1: Medication (see MAR)    Patient Stated Pain Goal: 0  o Interventions for Pain:  dilaudid  o Intervention effective: yes  o Time of last intervention:  0658  o Reassessment Completed: yes     ? Last 3 Weights:  Last 3 Recorded Weights in this Encounter    05/17/14 1250 05/18/14 0431 05/19/14 0620   Weight: 108.863 kg (240 lb) 109 kg (240 lb 4.8 oz) 111.086 kg (244 lb 14.4 oz)     Weight change: 2.223 kg (4 lb 14.4 oz)    ? INTAKE/OUPUT    Current Shift:       Last three shifts: 03/26 1901 - 03/28 0700  In: 1200 [P.O.:1200]  Out: 1251 [Urine:1250]    ? LAB RESULTS     Recent Labs      05/19/14   0139  05/18/14   1029  05/18/14   0301   05/17/14   1240   WBC  5.1   --   6.7   --   8.1   HGB  9.3*  10.1*  10.6*   < >  13.5   HCT  27.1*  30.0*  30.7*   < >  40.2   PLT  47*   --   55*   --   57*    < > =  values in this interval not displayed.        Recent Labs      05/19/14   0139  05/18/14   0301  05/17/14   1240   NA  140  139  136   K  4.0  3.6  4.3   GLU  102*  134*  142*   BUN  21*  27*  28*   CREA  0.72  0.77  0.72   CA  7.5*  7.5*  7.7*   MG   --    --   1.8   INR   --    --   1.3*       RECOMMENDATIONS AND DISCHARGE PLANNING     1. Pending tests/procedures/ Plan of Care or Other Needs: patient for EGD today; monitor H/H and signs of bleeding     2. Discharge plan for patient and Needs/Barriers: home when ready; needs SW consult for financial assistance with medications     3. Estimated Discharge Date: 05/21/14 Posted on Whiteboard in Patient???s Room: yes      4. The patient's care plan was reviewed with the oncoming nurse.       "HEALS" SAFETY CHECK      Fall Risk    Total Score: 1    Safety Measures: Safety Measures: Bed/Chair-Wheels locked, Bed in low position, Call light within reach, Side rails X 3    A safety check occurred in the patient's room between off going nurse and oncoming nurse listed above.    The safety check included the below items  Area Items   H  High Alert Medications ? Verify all high alert medication drips (heparin, PCA, etc.)   E  Equipment ? Suction is set up for ALL patients (with yanker)  ? Red plugs utilized for all equipment (IV pumps, etc.)  ? WOW???s wiped down at end of shift.  ? Room stocked with oxygen, suction, and other unit-specific supplies   A  Alarms ? Bed alarm is set for fall risk patients  ? Ensure chair alarm is in place and activated if patient is up in a chair   L  Lines ? Check IV for any infiltration   ? Foley bag is empty if patient has a Foley   ? Tubing and IV bags are labeled   S  Safety   ? Room is clean, patient is clean, and equipment is clean.  ? Hallways are clear from equipment besides carts.   ? Fall bracelet on for fall risk patients  ? Ensure room is clear and free of clutter  ? Suction is set up for ALL patients (with yanker)  ? Hallways are clear from equipment besides carts.   ? Isolation precautions followed, supplies available outside room, sign posted     Lucretia Field, RN

## 2014-05-19 NOTE — Other (Signed)
TRANSFER - OUT REPORT:    Verbal report given to [name] on Todd CascoRandolph C Wallace  being transferred to room 368 on [address] for routine progression of care       Report consisted of patient???s Situation, Background, Assessment and   Recommendations(SBAR).     Information from the following report(s) SBAR, OR Summary, Intake/Output and MAR was reviewed with the receiving nurse.    Opportunity for questions and clarification was provided.      Patient transported with:   [employer]

## 2014-05-19 NOTE — Anesthesia Post-Procedure Evaluation (Signed)
Post-Anesthesia Evaluation and Assessment    Patient: [name] MRN: [id number]  SSN: [id number]    Date of Birth: 08/01/1950  Age: 64 y.o.  Sex: male       Cardiovascular Function/Vital Signs  Visit Vitals   Item Reading   ??? BP 100/57 mmHg   ??? Pulse 64   ??? Temp 36.7 ??C (98.1 ??F)   ??? Resp 14   ??? Ht 5\' 10"  (1.778 m)   ??? Wt 111.086 kg (244 lb 14.4 oz)   ??? BMI 35.14 kg/m2   ??? SpO2 100%       Patient is status post MAC anesthesia for Procedure(s):  ENDOSCOPY with biopsies.    Nausea/Vomiting: None    Postoperative hydration reviewed and adequate.    Pain:  Pain Scale 1: Numeric (0 - 10) (05/19/14 1414)  Pain Intensity 1: 0 (05/19/14 1414)   Managed    Neurological Status:   Neuro (WDL): Within Defined Limits (05/19/14 1409)   At baseline    Mental Status and Level of Consciousness: Alert and oriented     Pulmonary Status:   O2 Device: Nasal cannula (05/19/14 1414)   Adequate oxygenation and airway patent    Complications related to anesthesia: None    Post-anesthesia assessment completed. No concerns    Signed By: Michelene HeadyARATI Rheta Hemmelgarn, MD     May 19, 2014

## 2014-05-19 NOTE — Other (Signed)
Bedside shift change report given to [name], RN (oncoming nurse) by Artist PaisShawna, RN (offgoing nurse). Report included the following information SBAR, Kardex and MAR.    Pt is scheduled for colonoscopy. Consent form signed. PreOp checklist completed.

## 2014-05-19 NOTE — Other (Signed)
Patient transported off floor to endoscopy.

## 2014-05-19 NOTE — Other (Signed)
Received patient awake, alert, and oriented, c/o pain, sore throat pain 10/10.

## 2014-05-19 NOTE — Other (Signed)
TRANSFER - IN REPORT:    Electronic report  received from [address] on [name]  being received from [address] for ordered procedure      Report consisted of patient???s Situation, Background, Assessment and   Recommendations(SBAR).     Information from the following report(s) SBAR and Kardex was reviewed with the receiving nurse.      Assessment completed upon patient???s arrival to unit and care assumed.

## 2014-05-19 NOTE — Progress Notes (Signed)
[facility] Hospitalist Group  Progress Note    Patient: [name] Age: 64 y.o. DOB: February 12, 1951 MR#: [id number] SSN: [id number]  Date/Time: 05/19/2014 1:49 PM    Subjective:     No further melena, for egd today    Objective:   VS: BP 92/61 mmHg   Pulse 64   Temp(Src) 98 ??F (36.7 ??C)   Resp 20   Ht  (1.778 m)   Wt 111.086 kg (244 lb 14.4 oz)   BMI 35.14 kg/m2   SpO2 95%   Tmax/24hrs: Temp (24hrs), Avg:97.9 ??F (36.6 ??C), Min:97.7 ??F (36.5 ??C), Max:98.1 ??F (36.7 ??C)     Input/Output:     Intake/Output Summary (Last 24 hours) at 05/19/14 1234  Last data filed at 05/19/14 1214   Gross per 24 hour   Intake    480 ml   Output    450 ml   Net     30 ml       General:  nad  Cardiovascular:  s1s2  Pulmonary:  clear  GI:  benign  Extremities:  No edema  Additional:  No skin changes  Neuro:  Non focal    Labs:    Recent Results (from the past 24 hour(s))   CBC WITH AUTOMATED DIFF    Collection Time: 05/19/14  1:39 AM   Result Value Ref Range    WBC 5.1 4.6 - 13.2 K/uL    RBC 2.96 (L) 4.70 - 5.50 M/uL    HGB 9.3 (L) 13.0 - 16.0 g/dL    HCT 84.6 (L) 96.2 - 48.0 %    MCV 91.6 74.0 - 97.0 FL    MCH 31.4 24.0 - 34.0 PG    MCHC 34.3 31.0 - 37.0 g/dL    RDW 95.2 (H) 84.1 - 14.5 %    PLATELET 47 (L) 135 - 420 K/uL    MPV 12.6 (H) 9.2 - 11.8 FL    NEUTROPHILS 56 40 - 73 %    LYMPHOCYTES 15 (L) 21 - 52 %    MONOCYTES 16 (H) 3 - 10 %    EOSINOPHILS 12 (H) 0 - 5 %    BASOPHILS 1 0 - 2 %    ABS. NEUTROPHILS 2.9 1.8 - 8.0 K/UL    ABS. LYMPHOCYTES 0.8 (L) 0.9 - 3.6 K/UL    ABS. MONOCYTES 0.8 0.05 - 1.2 K/UL    ABS. EOSINOPHILS 0.6 (H) 0.0 - 0.4 K/UL    ABS. BASOPHILS 0.0 0.0 - 0.1 K/UL    DF AUTOMATED     METABOLIC PANEL, BASIC    Collection Time: 05/19/14  1:39 AM   Result Value Ref Range    Sodium 140 136 - 145 mmol/L    Potassium 4.0 3.5 - 5.5 mmol/L    Chloride 108 100 - 108 mmol/L    CO2 28 21 - 32 mmol/L    Anion gap 4 3.0 - 18 mmol/L    Glucose 102 (H) 74 - 99 mg/dL    BUN 21 (H) 7.0 - 18 MG/DL     Creatinine 3.24 0.6 - 1.3 MG/DL    BUN/Creatinine ratio 29 (H) 12 - 20      GFR est AA >60 >60 ml/min/1.46m2    GFR est non-AA >60 >60 ml/min/1.11m2    Calcium 7.5 (L) 8.5 - 10.1 MG/DL     Additional Data Reviewed:      Current Facility-Administered Medications   Medication Dose Route Frequency   ???  HYDROmorphone (PF) (DILAUDID) injection 1 mg  1 mg IntraVENous Q4H PRN   ??? pantoprazole (PROTONIX) tablet 40 mg  40 mg Oral ACB   ??? nadolol (CORGARD) tablet 40 mg  40 mg Oral DAILY   ??? ciprofloxacin HCl (CIPRO) tablet 500 mg  500 mg Oral Q12H   ??? lactulose (CHRONULAC) solution 10 g  15 mL Oral TID   ??? folic acid (FOLVITE) tablet 1 mg  1 mg Oral DAILY   ??? rifaximin (XIFAXAN) tablet 550 mg  550 mg Oral DAILY   ??? spironolactone (ALDACTONE) tablet 100 mg  100 mg Oral DAILY   ??? thiamine (B-1) tablet 100 mg  100 mg Oral DAILY   ??? sodium chloride (NS) flush 5-10 mL  5-10 mL IntraVENous Q8H   ??? sodium chloride (NS) flush 5-10 mL  5-10 mL IntraVENous PRN   ??? pneumococcal 23-valent (PNEUMOVAX 23) injection 0.5 mL  0.5 mL IntraMUSCular PRIOR TO DISCHARGE   ??? zolpidem (AMBIEN) tablet 5 mg  5 mg Oral QHS PRN       Assessment/Plan:     Patient Active Problem List   Diagnosis Code   ??? Alcoholic cirrhosis of liver (HCC) K70.30   ??? Hepatitis C B19.20   ??? Thrombocytopenia, unspecified (HCC) D69.6   ??? Alcohol abuse F10.10   ??? Anemia D64.9   ??? Hematemesis K92.0   ??? Melena K92.1   ??? Thrombocytopenia (HCC) D69.6   ??? Alcoholic cirrhosis (HCC) K70.30   ??? Chest pain R07.9   ??? GI bleed K92.2   ??? Acute upper GI bleeding K92.2   ??? 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 Cirrhosis of liver with ascites (HCC) K74.60       Plan:    Cont to monitor h/h and transfuse  Cont lactulose and xifaxan  Lasix, aldactone, nadolol  For egd today     Case discussed with:  Patient  Family  Nursing  Case Management  DVT Prophylaxis:  Lovenox  Hep SQ  SCDs  Coumadin   On Heparin gtt    Signed By: Chauncey ReadingMICHAEL S Jordyne Poehlman, MD

## 2014-05-19 NOTE — Other (Signed)
Received SBAR from Rosilyn MingsJoyce Brenan, Charity fundraiserN.

## 2014-05-19 NOTE — Procedures (Addendum)
[facility] [facility]  [address]  [place], [place] [id number]    Procedure Note    Patient: Todd CascoRandolph C Wallace MRN: [id number]  SSN: [id number]    Date of Birth: 11/02/1950  Age: 64 y.o.  Sex: male      Date/Time:  05/19/2014 2:07 PM    Esophagogastroduodenoscopy (EGD) Procedure Note    Procedure: Esophagogastroduodenoscopy with biopsy    Impression:    -1. Erosive gastritis 2. 1+ esophageal varices without stigmata of recent bleeding     Recommendations:  -1. Nadolol 40 mg daily and titrate to resting heart rate around 60-65  2. Check bx 3. F/u in 6 weeks3. PPI daily for 6 weeks then ok to dc , no OTC  NSAIDS     Indication: gi bleeding and anemia   Pre-operative Diagnosis: see indication above  Post-operative Diagnosis: see findings below  Operator:  Jiles ProwsLawson, JM MD  Referring Provider:   None    Exam:  Airway: clear, no airway problems anticipated  Heart: RRR, without gallops or rubs  Lungs: clear bilaterally without wheezes, crackles, or rhonchi  Abdomen: soft, nontender, nondistended, bowel sounds present  Mental Status: awake, alert and oriented to person, place and time     Anethesia/Sedation:  MAC anesthesia Propofol  Procedure Details   After informed consent was obtained for the procedure, with all risks and benefits of procedure explained the patient was taken to the endoscopy suite and placed in the left lateral decubitus position.  Following sequential administration of sedation as per above, the GIFH180 gastroscope was inserted into the mouth and advanced under direct vision to third portion of the duodenum.  A careful inspection was made as the gastroscope was withdrawn, including a retroflexed view of the proximal stomach; findings and interventions are described below.                  Findings: 1. erosive antral gastritis without active bleeding 2. 1+ mid to distal esophageal varices without stigmata of recent bleeding      Therapies:  none  Specimens: antral bx   Estimated blood loss:  none           Complications:   None; patient tolerated the procedure well.    Discharge disposition:  Return to ward advance diet f/u with us within 6 weeks     [name], MD

## 2014-05-19 NOTE — Procedures (Signed)
Procedures  by Ihor Austin, MD at 05/19/14 1405                Author: Ihor Austin, MD  Service: Gastroenterology  Author Type: Physician       Filed: 05/19/14 1412  Date of Service: 05/19/14 1405  Status: Addendum          Editor: Ihor Austin, MD (Physician)          Related Notes: Original Note by Ihor Austin, MD (Physician) filed at 05/19/14 1411            Pre-procedure Diagnoses        1. Gastrointestinal hemorrhage, unspecified gastrointestinal hemorrhage type [K92.2]                           Post-procedure Diagnoses        1. Erosive gastritis [K29.00]        2. Esophageal varices without bleeding, unspecified esophageal varices type (HCC) [I85.00]                           Procedures        1. UPPER GI ENDOSCOPY,BIOPSY [[id number]]                                         [place] [facility]   [address]   [place], [place] [address]      Procedure Note          Patient: [name]  MRN: [id number]   SSN: [id number]          Date of Birth: 09/20/1950   Age: 64 y.o.   Sex: male         Date/Time:   05/19/2014 2:07 PM      Esophagogastroduodenoscopy (EGD) Procedure Note      Procedure: Esophagogastroduodenoscopy with biopsy      Impression:     -1. Erosive gastritis 2. 1+ esophageal varices without stigmata of recent bleeding       Recommendations:   -1. Nadolol 40 mg daily and titrate to resting heart rate around 60-65   2. Check bx 3. F/u in 6 weeks3. PPI daily for 6 weeks then ok to dc , no OTC  NSAIDS       Indication: gi bleeding and anemia    Pre-operative Diagnosis: see indication above   Post-operative Diagnosis: see findings below   Operator:  Jiles Prows MD   Referring Provider:   None      Exam:   Airway: clear, no airway problems anticipated   Heart: RRR, without gallops or rubs   Lungs: clear bilaterally without wheezes, crackles, or rhonchi   Abdomen: soft, nontender, nondistended, bowel sounds present   Mental Status: awake, alert and oriented to person,  place and time       Anethesia/Sedation:  MAC anesthesia Propofol   Procedure Details    After informed consent was obtained for the procedure, with all risks and benefits of procedure explained the patient was taken to the endoscopy suite and placed in the left lateral decubitus position.  Following sequential administration of sedation  as per above, the GIFH180 gastroscope was inserted into the mouth and advanced under direct vision to third portion of the duodenum.  A careful inspection  was made as the gastroscope was withdrawn, including a retroflexed view of the proximal stomach;  findings and interventions are described below.                   Findings: 1. erosive antral gastritis without active bleeding 2. 1+ mid to distal esophageal varices without stigmata of recent bleeding         Therapies:  none   Specimens: antral bx   Estimated blood loss:  none             Complications:   None; patient tolerated the procedure well.      Discharge disposition:  Return to ward advance diet f/u with us within 6 [place]       Jonita AlbeeJOHN MARK Kenady Doxtater, MD

## 2014-05-20 LAB — CBC WITH AUTOMATED DIFF
ABS. BASOPHILS: 0 10*3/uL (ref 0.0–0.1)
ABS. EOSINOPHILS: 0.5 10*3/uL — ABNORMAL HIGH (ref 0.0–0.4)
ABS. LYMPHOCYTES: 0.6 10*3/uL — ABNORMAL LOW (ref 0.9–3.6)
ABS. MONOCYTES: 0.6 10*3/uL (ref 0.05–1.2)
ABS. NEUTROPHILS: 1.9 10*3/uL (ref 1.8–8.0)
BASOPHILS: 1 % (ref 0–2)
EOSINOPHILS: 14 % — ABNORMAL HIGH (ref 0–5)
HCT: 27.1 % — ABNORMAL LOW (ref 36.0–48.0)
HGB: 9.2 g/dL — ABNORMAL LOW (ref 13.0–16.0)
LYMPHOCYTES: 17 % — ABNORMAL LOW (ref 21–52)
MCH: 31.1 PG (ref 24.0–34.0)
MCHC: 33.9 g/dL (ref 31.0–37.0)
MCV: 91.6 FL (ref 74.0–97.0)
MONOCYTES: 16 % — ABNORMAL HIGH (ref 3–10)
MPV: 11.3 FL (ref 9.2–11.8)
NEUTROPHILS: 52 % (ref 40–73)
PLATELET: 42 10*3/uL — ABNORMAL LOW (ref 135–420)
RBC: 2.96 M/uL — ABNORMAL LOW (ref 4.70–5.50)
RDW: 15.5 % — ABNORMAL HIGH (ref 11.6–14.5)
WBC: 3.7 10*3/uL — ABNORMAL LOW (ref 4.6–13.2)

## 2014-05-20 LAB — METABOLIC PANEL, BASIC
Anion gap: 5 mmol/L (ref 3.0–18)
BUN/Creatinine ratio: 21 — ABNORMAL HIGH (ref 12–20)
BUN: 15 MG/DL (ref 7.0–18)
CO2: 27 mmol/L (ref 21–32)
Calcium: 7.6 MG/DL — ABNORMAL LOW (ref 8.5–10.1)
Chloride: 110 mmol/L — ABNORMAL HIGH (ref 100–108)
Creatinine: 0.71 MG/DL (ref 0.6–1.3)
GFR est AA: >60 mL/min/{1.73_m2} (ref >60)
GFR est non-AA: >60 mL/min/{1.73_m2} (ref >60)
Glucose: 142 mg/dL — ABNORMAL HIGH (ref 74–99)
Potassium: 4 mmol/L (ref 3.5–5.5)
Sodium: 142 mmol/L (ref 136–145)

## 2014-05-20 MED ORDER — SPIRONOLACTONE 100 MG TAB
100 mg | ORAL_TABLET | Freq: Every day | ORAL | Status: DC
Start: 2014-05-20 — End: 2014-09-18

## 2014-05-20 MED ORDER — PANTOPRAZOLE 40 MG TAB, DELAYED RELEASE
40 mg | ORAL_TABLET | Freq: Every day | ORAL | Status: DC
Start: 2014-05-20 — End: 2014-09-18

## 2014-05-20 MED ORDER — RIFAXIMIN 550 MG TAB
550 mg | ORAL_TABLET | Freq: Every day | ORAL | Status: DC
Start: 2014-05-20 — End: 2014-09-18

## 2014-05-20 MED ORDER — FERROUS SULFATE 325 MG (65 MG ELEMENTAL IRON) TAB
325 mg (65 mg iron) | ORAL_TABLET | Freq: Every day | ORAL | Status: DC
Start: 2014-05-20 — End: 2014-09-18

## 2014-05-20 MED ORDER — THIAMINE HCL 100 MG TAB
100 mg | ORAL_TABLET | Freq: Every day | ORAL | Status: DC
Start: 2014-05-20 — End: 2014-09-18

## 2014-05-20 MED ORDER — FOLIC ACID 1 MG TAB
1 mg | ORAL_TABLET | Freq: Every day | ORAL | Status: DC
Start: 2014-05-20 — End: 2014-09-18

## 2014-05-20 MED ORDER — LACTULOSE 10 GRAM/15 ML ORAL SOLN
10 gram/15 mL | Freq: Three times a day (TID) | ORAL | Status: DC
Start: 2014-05-20 — End: 2014-09-18

## 2014-05-20 MED ORDER — NADOLOL 40 MG TAB
40 mg | ORAL_TABLET | Freq: Every day | ORAL | Status: AC
Start: 2014-05-20 — End: 2014-06-19

## 2014-05-20 MED FILL — NADOLOL 20 MG TAB: 20 mg | ORAL | Qty: 2

## 2014-05-20 MED FILL — ZOLPIDEM 5 MG TAB: 5 mg | ORAL | Qty: 1

## 2014-05-20 MED FILL — VITAMIN B-1 100 MG TABLET: 100 mg | ORAL | Qty: 1

## 2014-05-20 MED FILL — LACTULOSE 20 GRAM/30 ML ORAL SOLUTION: 20 gram/30 mL | ORAL | Qty: 30

## 2014-05-20 MED FILL — PANTOPRAZOLE 40 MG TAB, DELAYED RELEASE: 40 mg | ORAL | Qty: 1

## 2014-05-20 MED FILL — FOLIC ACID 1 MG TAB: 1 mg | ORAL | Qty: 1

## 2014-05-20 MED FILL — XIFAXAN 550 MG TABLET: 550 mg | ORAL | Qty: 1

## 2014-05-20 MED FILL — HYDROMORPHONE (PF) 1 MG/ML IJ SOLN: 1 mg/mL | INTRAMUSCULAR | Qty: 1

## 2014-05-20 MED FILL — CIPROFLOXACIN 500 MG TAB: 500 mg | ORAL | Qty: 1

## 2014-05-20 MED FILL — DIPRIVAN 10 MG/ML INTRAVENOUS EMULSION: 10 mg/mL | INTRAVENOUS | Qty: 20

## 2014-05-20 MED FILL — SPIRONOLACTONE 25 MG TAB: 25 mg | ORAL | Qty: 4

## 2014-05-20 NOTE — Discharge Summary (Signed)
Discharge Summary    Patient: [name] MRN: [id number]  CSN: [id number]    Date of Birth: 06/27/1950  Age: 64 y.o.  Sex: male    DOA: 05/17/2014 LOS:  LOS: 3 days   Discharge Date:      Admission Diagnoses: GI bleed  GI bleed  dx    Discharge Diagnoses:    Problem List as of 05/20/2014  Date Reviewed: 05/19/2014          Codes Class Noted - Resolved    Cirrhosis of liver with ascites (HCC) ICD-10-CM: K74.60  ICD-9-CM: 571.5  05/09/2014 - Present        Varices, esophageal (HCC) ICD-10-CM: I85.00  ICD-9-CM: 456.1  03/25/2013 - Present        Acute upper GI bleeding ICD-10-CM: K92.2  ICD-9-CM: 578.9  01/20/2013 - Present        Hematemesis ICD-10-CM: K92.0  ICD-9-CM: 578.0  01/19/2013 - Present        Melena ICD-10-CM: K92.1  ICD-9-CM: 578.1  01/19/2013 - Present        Thrombocytopenia (HCC) ICD-10-CM: D69.6  ICD-9-CM: 287.5  01/19/2013 - Present        Alcoholic cirrhosis (HCC) ICD-10-CM: K70.30  ICD-9-CM: 571.2  01/19/2013 - Present        Chest pain ICD-10-CM: R07.9  ICD-9-CM: 786.50  01/19/2013 - Present        Anemia ICD-10-CM: D64.9  ICD-9-CM: 285.9  11/08/2012 - Present        Alcohol abuse ICD-10-CM: F10.10  ICD-9-CM: 305.00  03/11/2012 - Present        Thrombocytopenia, unspecified (HCC) ICD-10-CM: D69.6  ICD-9-CM: 287.5  03/10/2012 - Present        Hepatitis C ICD-10-CM: B19.20  ICD-9-CM: 070.70  12/23/2011 - Present        Alcoholic cirrhosis of liver (HCC) (Chronic) ICD-10-CM: K70.30  ICD-9-CM: 571.2  07/26/2011 - Present        RESOLVED: GI bleed ICD-10-CM: K92.2  ICD-9-CM: 578.9  01/20/2013 - 05/20/2014        RESOLVED: Drug-seeking behavior ICD-10-CM: Z72.89  ICD-9-CM: 305.90  03/11/2012 - 03/14/2012        RESOLVED: Upper GI bleed ICD-10-CM: K92.2  ICD-9-CM: 578.9  03/10/2012 - 03/14/2012        RESOLVED: Acute blood loss anemia ICD-10-CM: D62  ICD-9-CM: 285.1  03/10/2012 - 03/14/2012        RESOLVED: Acute encephalopathy ICD-10-CM: G93.40  ICD-9-CM: 348.30  03/10/2012 - 03/11/2012         RESOLVED: Other and unspecified noninfectious gastroenteritis and colitis ICD-9-CM: 558.9  07/26/2011 - 03/11/2012            Reason for Admission  64 year old man with a history of alcoholic cirrhosis, hepatitis C, ascites, chronic abdominal pain who presented to the ED with abdominal pain followed shortly thereafter by melenic stools. He reported he ran out of several medications due to financial barriers. His last EGD a few months prior was free of varices although he has had EBL in past.  No ETOH since Dec 2015. he has had therapeutic paracentesis multiple times in past. In the ED, stool was strongly heme-positive. ER doctor spoke with Dr. Reesa ChewNeumann, Gi, who recommended a PPI as well as Cipro orally be started because of his known history of ascites. GI was to follow in consult.     Discharge Condition: Good    PHYSICAL EXAM at discharge   Visit Vitals   Item Reading   ??? BP 126/83  mmHg   ??? Pulse 73   ??? Temp 98 ??F (36.7 ??C)   ??? Resp 20   ??? Ht  (1.778 m)   ??? Wt 103.103 kg (227 lb 4.8 oz)   ??? BMI 32.61 kg/m2   ??? SpO2 96%     General:?? nad  Cardiovascular:?? s1s2  Pulmonary:?? clear  GI:??soft, distended.   Extremities:?? No edema  Additional:?? No skin changes  Neuro:?? Non focal    Hospital Course:   1.?? Cirrhosis w anemia. EGD with same benign findings as recent EGD, no sig varices. DC on meds as recommended per GI, with OP f/u as scheduled already.  2. Abdominal pain resolved. Tolerating po. Discharge on ppi.   3. History of ascites with paracentesis in the past.   4. Elevated ammonia level - no evidence of hepatic encephalopathy - Lactulose and Xifaxan  5. Chronic thrombocytopenia and some coagulopathy, likely due to his chronic??liver disease.  6. Hepatitis C. Dr. Reesa Chew is trying to get him medications to treat this.  7. Previous alcohol use. He denies any use at all since November 2015.  8. Full code. Discharge to home. Patient agrees; all questions answered to the best of my ability.      Consults: Gastroenterology Dr. Reesa Chew    Procedures  Esophagogastroduodenoscopy with biopsy 05/19/14 Dr. Hart [place].    Significant Diagnostic Studies: labs:   Recent Results (from the past 24 hour(s))   METABOLIC PANEL, BASIC    Collection Time: 05/20/14  2:00 AM   Result Value Ref Range    Sodium 142 136 - 145 mmol/L    Potassium 4.0 3.5 - 5.5 mmol/L    Chloride 110 (H) 100 - 108 mmol/L    CO2 27 21 - 32 mmol/L    Anion gap 5 3.0 - 18 mmol/L    Glucose 142 (H) 74 - 99 mg/dL    BUN 15 7.0 - 18 MG/DL    Creatinine 1.61 0.6 - 1.3 MG/DL    BUN/Creatinine ratio 21 (H) 12 - 20      GFR est AA >60 >60 ml/min/1.29m2    GFR est non-AA >60 >60 ml/min/1.103m2    Calcium 7.6 (L) 8.5 - 10.1 MG/DL   CBC WITH AUTOMATED DIFF    Collection Time: 05/20/14  2:00 AM   Result Value Ref Range    WBC 3.7 (L) 4.6 - 13.2 K/uL    RBC 2.96 (L) 4.70 - 5.50 M/uL    HGB 9.2 (L) 13.0 - 16.0 g/dL    HCT 09.6 (L) 04.5 - 48.0 %    MCV 91.6 74.0 - 97.0 FL    MCH 31.1 24.0 - 34.0 PG    MCHC 33.9 31.0 - 37.0 g/dL    RDW 40.9 (H) 81.1 - 14.5 %    PLATELET 42 (L) 135 - 420 K/uL    MPV 11.3 9.2 - 11.8 FL    NEUTROPHILS 52 40 - 73 %    LYMPHOCYTES 17 (L) 21 - 52 %    MONOCYTES 16 (H) 3 - 10 %    EOSINOPHILS 14 (H) 0 - 5 %    BASOPHILS 1 0 - 2 %    ABS. NEUTROPHILS 1.9 1.8 - 8.0 K/UL    ABS. LYMPHOCYTES 0.6 (L) 0.9 - 3.6 K/UL    ABS. MONOCYTES 0.6 0.05 - 1.2 K/UL    ABS. EOSINOPHILS 0.5 (H) 0.0 - 0.4 K/UL    ABS. BASOPHILS 0.0 0.0 - 0.1 K/UL  DF AUTOMATED           Discharge Medications:     Current Discharge Medication List      CONTINUE these medications which have CHANGED    Details   nadolol (CORGARD) 40 mg tablet Take 1 Tab by mouth daily for 30 days.  Qty: 30 Tab, Refills: 2      pantoprazole (PROTONIX) 40 mg tablet Take 1 Tab by mouth daily.  Qty: 30 Tab, Refills: 2         CONTINUE these medications which have NOT CHANGED    Details   lactulose (CHRONULAC) 10 gram/15 mL solution Take 10 g by mouth three (3) times daily.       spironolactone (ALDACTONE) 100 mg tablet Take 1 Tab by mouth daily for 30 days.  Qty: 30 Tab, Refills: 0      rifaximin (XIFAXAN) 550 mg tablet Take 1 Tab by mouth daily for 30 days.  Qty: 30 Tab, Refills: 0      folic acid (FOLVITE) 1 mg tablet Take 1 tablet by mouth daily.  Qty: 30 tablet, Refills: 1      multivitamin capsule Take 1 capsule by mouth daily.  Qty: 30 capsule, Refills: 0      thiamine (B-1) 100 mg tablet Take 1 tablet by mouth daily.  Qty: 30 tablet, Refills: 1      ferrous sulfate 325 mg (65 mg iron) tablet Take 1 tablet by mouth Daily (before breakfast).  Qty: 30 tablet, Refills: 0         STOP taking these medications       furosemide (LASIX) 40 mg tablet Comments:   Reason for Stopping:               Activity: as tolerated.     Diet: Cardiac Diet    Wound Care: None needed    Follow-up:   pcp 7 - 10 days.  GI as scheduled.     Minutes spent on discharge: greater than 30

## 2014-05-20 NOTE — Progress Notes (Signed)
Problem: Self Care Deficits Care Plan (Adult)  Goal: *Acute Goals and Plan of Care (Insert Text)  OCCUPATIONAL THERAPY EVALUATION/DISCHARGE    Patient: [name] ([age] y.o. male)  Date: 05/20/2014  Primary Diagnosis: GI bleed  GI bleed  dx  Procedure(s) (LRB):  ENDOSCOPY with biopsies (N/A) 1 Day Post-Op   Precautions:  Fall      ASSESSMENT AND RECOMMENDATIONS:  Based on the objective data described below, the patient needed supervision with functional mobility with no AD and needed supervision with self care tasks. Patient had decreased, but functional BUE AROM and WFL grip strength. Patient needed supervision with simulated toilet transfer. Patient was able to donn/doff socks seated on EOB with supervision.  Skilled acute care occupational therapy is not indicated at this time.    Discharge Recommendations: None  Further Equipment Recommendations for Discharge: N/A       G-CODES:     Self Care [phone] Current  CI= 1-19%  G8988 Goal  CI= 1-19%  G8989 D/C  CI= 1-19%.  The severity rating is based on the Level of Assistance required for Functional Mobility and ADLs.        SUBJECTIVE:   Patient stated ???I don't need any help, I drive a truck.???      OBJECTIVE DATA SUMMARY:       Past Medical History   Diagnosis Date   ??? Hypertension     ??? Ill-defined condition         anemia   ??? Anemia     ??? Varices     ??? Hepatitis C virus     ??? GI bleed     ??? Alcohol abuse     ??? Hernia       Past Surgical History   Procedure Laterality Date   ??? Upper gi endoscopy,ligat varix   01/20/2013             Prior Level of Function/Home Situation: Patient reported he was independent in basic self care tasks and functional mobility PTA.  Home Situation  Home Environment: Apartment  # Steps to Enter: 2  Rails to Enter: Yes  Hand Rails : Bilateral  One/Two Story Residence: One story  Living Alone: No  Support Systems: Friends \\ neighbors  Patient Expects to be Discharged to:: Apartment  Current DME Used/Available at Home: None   [X]      Right hand dominant       [ ]      Left hand dominant  Cognitive/Behavioral Status:  Neurologic State: Alert  Orientation Level: Oriented X4  Cognition: Appropriate decision making;Follows commands     Skin: No skin changes noted     Edema: No edema noted    Vision/Perceptual:       Acuity: Within Defined Limits       Coordination:  Coordination: Within functional limits (BUEs)       Balance:  Sitting: Intact  Standing: Intact;Without support     Strength:  Strength: Within functional limits (grip strength)      Tone & Sensation:  Tone: Normal (BUEs)  Sensation: Intact (BUEs)      Range of Motion:  AROM: Generally decreased, functional (BUEs)      Functional Mobility and Transfers for ADLs:  Bed Mobility:  Rolling: Supervision  Supine to Sit: Supervision  Scooting: Supervision  Transfers:  Sit to Stand: Supervision              Toilet Transfer : Supervision (simulated)  ADL Assessment:(clincial judgement based on functional mobility)  Feeding: Supervision    Oral Facial Hygiene/Grooming: Supervision    Bathing: Supervision    Upper Body Dressing: Supervision    Lower Body Dressing: Supervision    Toileting: Supervision    Pain:  Pt reports pain in abdomen, but did not rate the number.    Activity Tolerance:   Good    Please refer to the flowsheet for vital signs taken during this treatment.  After treatment:     Patient left in no apparent distress sitting up in chair    Patient left in no apparent distress in bed    Call bell left within reach    Nursing notified    Caregiver present    Bed alarm activated      COMMUNICATION/EDUCATION:   Communication/Collaboration:        Home safety education was provided and the patient/caregiver indicated understanding.        Patient/family have participated as able and agree with findings and recommendations.        Patient is unable to participate in plan of care at this time.    Lowell Guitar, OTR/L   Time Calculation: 9 mins

## 2014-05-20 NOTE — Progress Notes (Addendum)
Care Management Interventions  PCP Verified by CM:  (none, need one to be arranged)  Transition of Care Consult (CM Consult): Other (meds assistance, benefit info)  Physical Therapy Consult: Yes  Occupational Therapy Consult: Yes  Current Support Network: Other (lives wieth friend 1 st floor apt building 1-2 steps to enter)  Confirm Follow Up Transport: Family  Plan discussed with Pt/Family/Caregiver: Yes (pt plans to return home at [employer]  Discharge Location  Discharge Placement: Home    Patient 64 years old male admitted to tele with gi bleed. Spoke with pt in room alone, he is alert & oriented to place, person, time, and situation, open to conversation. Pt says he lives with his "life long friend" in 1 st floor apt building, 1-2 steps to enter. He is self care with ADLs/IADls, no DMEs in use and no need voiced. Client confirmed he does not have PCP, will arrange with PCP prior dc. Patient uninsured, states he can not afford medications at the time and asks for assistance. Pt will be provided with indigent meds at dc, but he was informed that no OTC nor opioids are included in indigent meds program. Patient also was provided with good Rx saving card for his further purchases. Pt says at dc his friend will assist him with trnansportation to home. Will continue to follow. O McFadden, rn, cm    1:46 PM pt is being dc home. Patient is discharged on medication Xifaxan 550 mg; called [employer] to ask for free trial, spoke with rep who stated that there is no free trial program available, 0 co-pay program available only for clients with insurance; but there is a Secretary/administratorfinancial assistance program application for [employer]. Financial assistance program application for [employer] printed and provided to pt to complete and mail/fax to [facility] for benefit review. Patient was instructed in details how to fill out the application and that he has to  do it as soon as possible so he can have assistance with further refills, patient voiced understanding. Spoke with outpt Rx, Xifaxan 30-day supply will cost to the hospital $ 1295.78 to provid as indigent med. I called and spoke with Estanislado SpireKristina Cartwright, she approved Xifaxan for the pt together with other indigent meds. Indigent meds form with prescriptions faxed to outpt Rx for bedside delivery.   Patient states his friend will assist with transportation to home. Robbie Lis McFadden, rn, cm

## 2014-05-20 NOTE — Progress Notes (Signed)
Bedside and Verbal shift change report given to IntelWhitley RN (oncoming nurse) by Garnett FarmKevin Hizon, RN (offgoing nurse). Report included the following information SBAR, Kardex, MAR and Recent Results.    SITUATION:   ? Code Status: Full Code  ? Reason for Admission: GI bleed  ? GI bleed  ? dx    ? Hospital day: 3  ? Problem List:       Hospital Problems  Date Reviewed: 05/19/2014          Codes Class Noted POA    GI bleed ICD-10-CM: K92.2  ICD-9-CM: 578.9  01/20/2013 Yes              BACKGROUND:    Past Medical History:   Past Medical History   Diagnosis Date   ??? Hypertension    ??? Ill-defined condition      anemia   ??? Anemia    ??? Varices    ??? Hepatitis C virus    ??? GI bleed    ??? Alcohol abuse    ??? Hernia          Patient taking anticoagulants no     ASSESSMENT:   ? Changes in Assessment Throughout Shift:     ? Patient has Central Line: no Reasons if yes:   ? Patient has Foley Cath: no Reasons if yes:      ? Last Vitals:     Filed Vitals:    05/20/14 0342   BP: 109/71   Pulse: 73   Temp: 97.9 ??F (36.6 ??C)   Resp: 20   Height:    Weight:    SpO2: 97%       ? IV and DRAINS (will only show if present)   Peripheral IV 05/17/14 Left Antecubital-Site Assessment: Clean, dry, & intact  Peripheral IV 05/20/14 Left Hand-Site Assessment: Clean, dry, & intact    ? WOUND (if present)   Wound Type:  none   Dressing present Dressing Present : No   Wound Concerns/Notes:  none    ? PAIN    Pain Assessment    Pain Intensity 1: 5 (05/20/14 0526)    Pain Location 1: Abdomen    Pain Intervention(s) 1: Medication (see MAR)    Patient Stated Pain Goal: 0  o Interventions for Pain:  Pain meds given, see mar.   o Intervention effective: yes  o Time of last intervention: 0400   o Reassessment Completed: yes     ? Last 3 Weights:  Last 3 Recorded Weights in this Encounter    05/17/14 1250 05/18/14 0431 05/19/14 0620   Weight: 108.863 kg (240 lb) 109 kg (240 lb 4.8 oz) 111.086 kg (244 lb 14.4 oz)     Weight change:     ? INTAKE/OUPUT     Current Shift:      Last three shifts: 03/27 0701 - 03/28 1900  In: 802.5 [P.O.:480; I.V.:322.5]  Out: 450 [Urine:450]    ? LAB RESULTS     Recent Labs      05/20/14   0200  05/19/14   0139  05/18/14   1029  05/18/14   0301   WBC  3.7*  5.1   --   6.7   HGB  9.2*  9.3*  10.1*  10.6*   HCT  27.1*  27.1*  30.0*  30.7*   PLT  42*  47*   --   55*        Recent Labs  05/20/14   0200  05/19/14   0139  05/18/14   0301  05/17/14   1240   NA  142  140  139  136   K  4.0  4.0  3.6  4.3   GLU  142*  102*  134*  142*   BUN  15  21*  27*  28*   CREA  0.71  0.72  0.77  0.72   CA  7.6*  7.5*  7.5*  7.7*   MG   --    --    --   1.8   INR   --    --    --   1.3*       RECOMMENDATIONS AND DISCHARGE PLANNING     1. Pending tests/procedures/ Plan of Care or Other Needs:      2. Discharge plan for patient and Needs/Barriers:     3. Estimated Discharge Date: 05/22/14 Posted on Whiteboard in Patient???s Room: yes      4. The patient's care plan was reviewed with the oncoming nurse.       "HEALS" SAFETY CHECK      Fall Risk    Total Score: 1    Safety Measures: Safety Measures: Bed/Chair alarm on, Bed/Chair-Wheels locked, Bed in low position, Call light within reach, Fall prevention (comment), Side rails X 3    A safety check occurred in the patient's room between off going nurse and oncoming nurse listed above.    The safety check included the below items  Area Items   H  High Alert Medications ? Verify all high alert medication drips (heparin, PCA, etc.)   E  Equipment ? Suction is set up for ALL patients (with yanker)  ? Red plugs utilized for all equipment (IV pumps, etc.)  ? WOW???s wiped down at end of shift.  ? Room stocked with oxygen, suction, and other unit-specific supplies   A  Alarms ? Bed alarm is set for fall risk patients  ? Ensure chair alarm is in place and activated if patient is up in a chair   L  Lines ? Check IV for any infiltration  ? Foley bag is empty if patient has a Foley   ? Tubing and IV bags are labeled   S   Safety   ? Room is clean, patient is clean, and equipment is clean.  ? Hallways are clear from equipment besides carts.   ? Fall bracelet on for fall risk patients  ? Ensure room is clear and free of clutter  ? Suction is set up for ALL patients (with yanker)  ? Hallways are clear from equipment besides carts.   ? Isolation precautions followed, supplies available outside room, sign posted     Garnett Farm, RN

## 2014-05-20 NOTE — Progress Notes (Signed)
Problem: Mobility Impaired (Adult and Pediatric)  Goal: *Acute Goals and Plan of Care (Insert Text)  PHYSICAL THERAPY EVALUATION AND DISCHARGE    Patient: [name] ([age] y.o. male)  Date: 05/20/2014  Primary Diagnosis: GI bleed  GI bleed  dx  Procedure(s) (LRB):  ENDOSCOPY with biopsies (N/A) 1 Day Post-Op   Precautions: None         ASSESSMENT AND RECOMMENDATIONS:  Based on the objective data described below, the patient presents with no functional mobility deficits limiting return to PLOF. Pt reported no questions or concerns regarding discharge disposition. Pt ambulated 8 ft with no AD and S, demo no LOB and reported no symptomatic complaints. Pt demo ability to reach outside BOS to floor with no LOB and no symptomatic complaints. Pt educated on role of therapy during acute stay as well as no need to continue services for him as he is near his baseline, pt verbalized understanding. Pt in chair at end of session, educated on slow return to PLOF and energy conservation as well as overall slow mobility, verbalized understanding. Skilled physical therapy is not indicated at this time.  Discharge Recommendations: Anticipate none  Further Equipment Recommendations for Discharge: anticipate none        SUBJECTIVE:   Patient stated ???I need to eat now.???      OBJECTIVE DATA SUMMARY:       Past Medical History   Diagnosis Date   ??? Hypertension     ??? Ill-defined condition         anemia   ??? Anemia     ??? Varices     ??? Hepatitis C virus     ??? GI bleed     ??? Alcohol abuse     ??? Hernia       Past Surgical History   Procedure Laterality Date   ??? Upper gi endoscopy,ligat varix   01/20/2013             Barriers to Learning/Limitations: None  Compensate with: visual, verbal, tactile, kinesthetic cues/model  Prior Level of Function/Home Situation: Pt was (I) with all amb and mobility PTA. Pt is a Naval architecttruck driver.   Home Situation  Home Environment: Apartment  # Steps to Enter: 2  Rails to Enter: Yes  Hand Rails : Bilateral   One/Two Story Residence: One story  Living Alone: No  Support Systems: Friends \\ neighbors  Patient Expects to be Discharged to:: Apartment  Current DME Used/Available at Home: None  Critical Behavior:  Neurologic State: Alert  Orientation Level: Oriented X4  Cognition: Appropriate decision making;Appropriate for age attention/concentration;Appropriate safety awareness;Follows commands  Psychosocial  Patient Behaviors: Calm;Cooperative  Purposeful Interaction: Yes  Pt Identified Daily Priority: Clinical issues (comment)  Caritas Process: Nurture loving kindness;Establish trust;Teaching/learning;Attend basic human needs;Create healing environment  Caring Interventions: Reassure;Therapeutic modalities  Reassure: Therapeutic listening;Informing  Therapeutic Modalities: Humor  Strength:    Strength: Generally decreased, functional  Tone & Sensation:   Tone: Normal  Sensation: Intact  Range Of Motion:  AROM: Within functional limits  PROM: Within functional limits  Functional Mobility:  Bed Mobility:  Supine to Sit: Supervision  Transfers:  Sit to Stand: Supervision  Stand to Sit: Supervision  Balance:   Sitting: Intact  Standing: Intact  Ambulation/Gait Training:  Distance (ft): 8 Feet (ft)  Assistive Device:  (none)  Ambulation - Level of Assistance: Supervision  Gait Description (WDL): Exceptions to WDL  Gait Abnormalities: Trunk sway increased  Speed/Cadence: Slow  Pain:  Pain  Scale 1: Numeric (0 - 10)  Pain Intensity 1: 9  Pain Location 1: Abdomen  Pain Orientation 1: Anterior  Pain Description 1: Aching  Pain Intervention(s) 1: Medication (see MAR)  Activity Tolerance:   Pt reported no symptomatic complaints throughout session.  Please refer to the flowsheet for vital signs taken during this treatment.  After treatment:            Patient left in no apparent distress sitting up in chair           Patient left in no apparent distress in bed           Call bell left within reach            Nursing notified           Caregiver present           Bed alarm activated      COMMUNICATION/EDUCATION:            Fall prevention education was provided and the patient/caregiver indicated understanding.           Patient/family have participated as able in goal setting and plan of care.           Patient/family agree to work toward stated goals and plan of care.           Patient understands intent and goals of therapy, but is neutral about his/her participation.           Patient is unable to participate in goal setting and plan of care.    Thank you for this referral.  Luz Brazen PT, DPT   Time Calculation: 9 mins

## 2014-05-20 NOTE — Progress Notes (Signed)
I have reviewed discharge instructions with the patient.  The patient verbalized understanding.

## 2014-05-20 NOTE — Progress Notes (Signed)
[facility] [place] [place]  [phone]  [email or web address]         Impression/Plan:  1.  Cirrhosis w anemia. EGD with same benign findings as recent EGD, no sig varices. Needs assistance w his meds to help him be more compliant. Cont meds as detailed in my prior note and Dr. Candie ChromanLawson's. Can plan to DC on those meds w OP f/u as scheduled already. Will sign off, avail if needed.      Chief Complaint: cirrhosis      Subjective:  No new issues. Hg is stable. No encephalopathy.        Eyes: conjunctiva normal, EOM normal   Neck: ROM normal, supple and trachea normal   Cardiovascular: heart normal, intact distal pulses, normal rate and regular rhythm   Pulmonary/Chest Wall: breath sounds normal and effort normal   Abdominal: appearance normal, bowel sounds normal and soft, non-acute, non-tender                BP 126/83 mmHg   Pulse 73   Temp(Src) 98 ??F (36.7 ??C)   Resp 20   Ht 5\' 10"  (1.778 m)   Wt 103.103 kg (227 lb 4.8 oz)   BMI 32.61 kg/m2   SpO2 96%        Intake/Output Summary (Last 24 hours) at 05/20/14 1126  Last data filed at 05/20/14 [id number]   Gross per 24 hour   Intake  682.5 ml   Output      0 ml   Net  682.5 ml       CBC w/Diff    Lab Results   Component Value Date/Time    WBC 3.7* 05/20/2014 02:00 AM    RBC 2.96* 05/20/2014 02:00 AM    HGB 9.2* 05/20/2014 02:00 AM    HCT 27.1* 05/20/2014 02:00 AM    MCV 91.6 05/20/2014 02:00 AM    MCH 31.1 05/20/2014 02:00 AM    MCHC 33.9 05/20/2014 02:00 AM    RDW 15.5* 05/20/2014 02:00 AM    PLT 42* 05/20/2014 02:00 AM    Lab Results   Component Value Date/Time    GRANS 52 05/20/2014 02:00 AM    LYMPH 17* 05/20/2014 02:00 AM    EOS 14* 05/20/2014 02:00 AM    BANDS 2.9 03/03/2014 01:41 PM    BASOS 1 05/20/2014 02:00 AM    PRO 1* 07/27/2011 04:30 PM      Basic Metabolic Profile   Recent Labs      05/20/14   0200   05/17/14   1240   NA  142   < >  136   K  4.0   < >  4.3   CL  110*   < >  103   CO2  27   < >  28   BUN  15   < >  28*    CA  7.6*   < >  7.7*   MG   --    --   1.8    < > = values in this interval not displayed.        Hepatic Function    Lab Results   Component Value Date/Time    ALB 3.1* 05/17/2014 12:40 PM    TP 7.2 05/17/2014 12:40 PM    AP 99 05/17/2014 12:40 PM    Lab Results   Component Value Date/Time    SGOT 67* 05/17/2014 12:40 PM        @  [facility](CULT:3)                 Kathryne Eriksson, MD, MD  [facility] [place] [place]  [email or web address]

## 2014-05-20 NOTE — Progress Notes (Signed)
OT orders received and chart reviewed. Patient is currently on bedrest at this time. Please update patients' activity level prior to OT evaluation. Thank you.    Alyse Soriano OTR/L

## 2014-05-27 ENCOUNTER — Encounter: Attending: Nurse Practitioner | Primary: Family Medicine

## 2014-09-15 ENCOUNTER — Emergency Department: Admit: 2014-09-15 | Payer: Self-pay | Primary: Family Medicine

## 2014-09-15 ENCOUNTER — Inpatient Hospital Stay
Admit: 2014-09-15 | Discharge: 2014-09-18 | Disposition: A | Discharge status: Home / Self Care | Payer: Self-pay | Attending: Internal Medicine | Admitting: Internal Medicine

## 2014-09-15 DIAGNOSIS — K7040 Alcoholic hepatic failure without coma: Principal | ICD-10-CM

## 2014-09-15 LAB — CBC WITH AUTOMATED DIFF
ABS. BASOPHILS: 0 10*3/uL (ref 0.0–0.06)
ABS. EOSINOPHILS: 0.2 10*3/uL (ref 0.0–0.4)
ABS. LYMPHOCYTES: 0.5 10*3/uL — ABNORMAL LOW (ref 0.9–3.6)
ABS. MONOCYTES: 0.7 10*3/uL (ref 0.05–1.2)
ABS. NEUTROPHILS: 1.6 10*3/uL — ABNORMAL LOW (ref 1.8–8.0)
BASOPHILS: 0 % (ref 0–2)
EOSINOPHILS: 6 % — ABNORMAL HIGH (ref 0–5)
HCT: 33 % — ABNORMAL LOW (ref 36.0–48.0)
HGB: 10.3 g/dL — ABNORMAL LOW (ref 13.0–16.0)
LYMPHOCYTES: 18 % — ABNORMAL LOW (ref 21–52)
MCH: 27.5 PG (ref 24.0–34.0)
MCHC: 31.2 g/dL (ref 31.0–37.0)
MCV: 88 FL (ref 74.0–97.0)
MONOCYTES: 22 % — ABNORMAL HIGH (ref 3–10)
MPV: 11.6 FL (ref 9.2–11.8)
NEUTROPHILS: 54 % (ref 40–73)
PLATELET COMMENTS: DECREASED
PLATELET: 46 10*3/uL — ABNORMAL LOW (ref 135–420)
RBC: 3.75 M/uL — ABNORMAL LOW (ref 4.70–5.50)
RDW: 18.3 % — ABNORMAL HIGH (ref 11.6–14.5)
WBC: 3 10*3/uL — ABNORMAL LOW (ref 4.6–13.2)

## 2014-09-15 LAB — URINALYSIS W/ RFLX MICROSCOPIC
Blood: NEGATIVE
Glucose: NEGATIVE mg/dL
Nitrites: NEGATIVE
Protein: NEGATIVE mg/dL
Specific gravity: 1.03 (ref 1.005–1.030)
Urobilinogen: 1 EU/dL (ref 0.2–1.0)
pH (UA): 5.5 (ref 5.0–8.0)

## 2014-09-15 LAB — URINE MICROSCOPIC ONLY
RBC: 0 /hpf (ref 0–5)
WBC: 1 /hpf (ref 0–4)

## 2014-09-15 LAB — HEPATIC FUNCTION PANEL
A-G Ratio: 0.9 (ref 0.8–1.7)
ALT (SGPT): 69 U/L — ABNORMAL HIGH (ref 16–61)
AST (SGOT): 79 U/L — ABNORMAL HIGH (ref 15–37)
Albumin: 3.2 g/dL — ABNORMAL LOW (ref 3.4–5.0)
Alk. phosphatase: 83 U/L (ref 45–117)
Bilirubin, direct: 0.5 MG/DL — ABNORMAL HIGH (ref 0.0–0.2)
Bilirubin, total: 1.5 MG/DL — ABNORMAL HIGH (ref 0.2–1.0)
Globulin: 3.4 g/dL (ref 2.0–4.0)
Protein, total: 6.6 g/dL (ref 6.4–8.2)

## 2014-09-15 LAB — METABOLIC PANEL, BASIC
Anion gap: 2 mmol/L — ABNORMAL LOW (ref 3.0–18)
BUN/Creatinine ratio: 11 — ABNORMAL LOW (ref 12–20)
BUN: 10 MG/DL (ref 7.0–18)
CO2: 33 mmol/L — ABNORMAL HIGH (ref 21–32)
Calcium: 7.4 MG/DL — ABNORMAL LOW (ref 8.5–10.1)
Chloride: 104 mmol/L (ref 100–108)
Creatinine: 0.92 MG/DL (ref 0.6–1.3)
GFR est AA: >60 mL/min/{1.73_m2} (ref >60)
GFR est non-AA: >60 mL/min/{1.73_m2} (ref >60)
Glucose: 113 mg/dL — ABNORMAL HIGH (ref 74–99)
Potassium: 3.6 mmol/L (ref 3.5–5.5)
Sodium: 139 mmol/L (ref 136–145)

## 2014-09-15 LAB — NT-PRO BNP: NT pro-BNP: 63 PG/ML (ref 0–900)

## 2014-09-15 LAB — CARDIAC PANEL,(CK, CKMB & TROPONIN)
CK - MB: 9.5 ng/ml — ABNORMAL HIGH (ref 0.5–3.6)
CK-MB Index: 1.9 % (ref 0.0–4.0)
CK: 489 U/L — ABNORMAL HIGH (ref 39–308)
Troponin-I, QT: 0.02 NG/ML (ref 0.00–0.06)

## 2014-09-15 LAB — AMMONIA: Ammonia, plasma: 141 umol/L — ABNORMAL HIGH (ref 11–32)

## 2014-09-15 LAB — LIPASE: Lipase: 171 U/L (ref 73–393)

## 2014-09-15 MED ORDER — MORPHINE 4 MG/ML SYRINGE
4 mg/mL | INTRAMUSCULAR | Status: AC
Start: 2014-09-15 — End: 2014-09-15
  Administered 2014-09-15: 21:00:00 via INTRAVENOUS

## 2014-09-15 MED ORDER — LACTULOSE 10 GRAM/15 ML ORAL SOLUTION
10 gram/15 mL | ORAL | Status: AC
Start: 2014-09-15 — End: 2014-09-15
  Administered 2014-09-15: 21:00:00 via ORAL

## 2014-09-15 MED ORDER — ONDANSETRON (PF) 4 MG/2 ML INJECTION
4 mg/2 mL | INTRAMUSCULAR | Status: AC
Start: 2014-09-15 — End: 2014-09-15
  Administered 2014-09-15: 21:00:00 via INTRAVENOUS

## 2014-09-15 MED ORDER — IOPAMIDOL 61 % IV SOLN
300 mg iodine /mL (61 %) | Freq: Once | INTRAVENOUS | Status: AC
Start: 2014-09-15 — End: 2014-09-15
  Administered 2014-09-15: 21:00:00 via INTRAVENOUS

## 2014-09-15 MED FILL — LACTULOSE 20 GRAM/30 ML ORAL SOLUTION: 20 gram/30 mL | ORAL | Qty: 30

## 2014-09-15 MED FILL — MORPHINE 4 MG/ML SYRINGE: 4 mg/mL | INTRAMUSCULAR | Qty: 1

## 2014-09-15 MED FILL — ONDANSETRON (PF) 4 MG/2 ML INJECTION: 4 mg/2 mL | INTRAMUSCULAR | Qty: 2

## 2014-09-15 MED FILL — ISOVUE-300  61 % INTRAVENOUS SOLUTION: 300 mg iodine /mL (61 %) | INTRAVENOUS | Qty: 100

## 2014-09-15 NOTE — ED Notes (Signed)
Pt ambulatory around room.  Reporting continuing pain to his abdomen.  NP will speak to pt.

## 2014-09-15 NOTE — ED Notes (Addendum)
Pt states that he was sent here by Dr. Thalia BloodgoodNeuman for lab work and abdominal pain. Pt states that he needs refills on all of his prescriptions and has not taken meds in over 1 month.

## 2014-09-15 NOTE — ED Notes (Signed)
[facility]  Patient to CT via w/c.

## 2014-09-15 NOTE — ED Notes (Signed)
Report given to EMS

## 2014-09-15 NOTE — ED Notes (Signed)
Per S. Halvorsen Dr Reesa Chew returned call and will have someone from GI team see him in the AM.

## 2014-09-15 NOTE — Other (Signed)
TRANSFER - OUT REPORT:    Verbal report given to Caryl Never RN(name) on [name]  being transferred to 4N(unit) for routine progression of care       Report consisted of patient???s Situation, Background, Assessment and   Recommendations(SBAR). Pt history/complaints/treatments in ED/plan of care discussed with accepting nurse.    Lines:   Peripheral IV 09/15/14 Right Wrist (Active)   Site Assessment Clean, dry, & intact 09/15/2014  3:41 PM   Phlebitis Assessment 0 09/15/2014  3:41 PM   Infiltration Assessment 0 09/15/2014  3:41 PM   Dressing Status Clean, dry, & intact 09/15/2014  3:41 PM   Dressing Type Tape;Transparent 09/15/2014  3:41 PM   Hub Color/Line Status Flushed 09/15/2014  3:41 PM        Opportunity for questions and clarification was provided.      Patient transported with:  EMS/paramedic/EMT

## 2014-09-15 NOTE — ED Notes (Signed)
Pt states he has not taken is Rx meds x 1 month and is here for refills.

## 2014-09-15 NOTE — ED Provider Notes (Signed)
HPI Comments:   4: 00PM  64 y.o. male presents to ED C/O abdominal pain.  Patient has a HX of HTN, Anemia, varices, Hepatitis C, GI bleed, Hernia, ascites, upper GI bleed with banding, alcoholic cirrhosis of the liver.  Patient reports he was sent into the ED by Dr Jacinto Reap because he has missed multiple appointments with provider and he wont refill his medications.  Patient has issues with ammonia and needs his medication refilled.  Patient also reports peri umblical  pain 4 weeks.  Patient does have a HX of umbilical hernia but he reports pain is worse now. Patient also reports wheezing x 1 week, denies cough but feels short of breath.  Patient denies CP, fever, diarrhea. When asked if he felt confused patient repots he always is confused but no more than usual today. Patient reports his stomach is not bloated like when he has required fluid to be drained in the past. Patient reports he vomited 2 times two days ago but none today.  Patient is a former smoker.  Patient reports he drinks between 6-12 bottles a beer a week.  Pt denies any other sxs or complaints.    Written by Catha Brow NP-C        The history is provided by the patient. History limited by: No language barrier.         Past Medical History:   Diagnosis Date   ??? Hypertension    ??? Ill-defined condition      anemia   ??? Anemia    ??? Varices    ??? Hepatitis C virus    ??? GI bleed    ??? Alcohol abuse    ??? Hernia        Past Surgical History:   Procedure Laterality Date   ??? Upper gi endoscopy,ligat varix  01/20/2013               Family History:   Problem Relation Age of Onset   ??? Heart Disease Father 90     MI   ??? Heart Disease Brother        History     Social History   ??? Marital Status: SINGLE     Spouse Name: N/A   ??? Number of Children: N/A   ??? Years of Education: N/A     Occupational History   ??? Not on file.     Social History Main Topics   ??? Smoking status: Former Smoker   ??? Smokeless tobacco: Never Used   ??? Alcohol Use: No       Comment: quit drinking December 2014   ??? Drug Use: No   ??? Sexual Activity: No     Other Topics Concern   ??? Not on file     Social History Narrative         ALLERGIES: Review of patient's allergies indicates no known allergies.    Review of Systems   Constitutional: Negative for fever, chills, activity change, appetite change and fatigue.   HENT: Negative for congestion, dental problem, ear pain, rhinorrhea, sinus pressure, sneezing, sore throat, trouble swallowing and voice change.    Eyes: Negative for pain, discharge, redness and itching.   Respiratory: Negative for cough, chest tightness, shortness of breath and wheezing.    Cardiovascular: Negative for chest pain and palpitations.   Gastrointestinal: Positive for abdominal pain. Negative for nausea, vomiting, diarrhea, constipation, blood in stool, abdominal distention and rectal pain.   Endocrine: Negative for polyuria.  Genitourinary: Negative for dysuria, hematuria, flank pain, discharge, penile pain and testicular pain.   Musculoskeletal: Negative for back pain, joint swelling, arthralgias, neck pain and neck stiffness.   Skin: Negative for color change, rash and wound.   Allergic/Immunologic: Negative for immunocompromised state.   Neurological: Negative for dizziness, weakness, light-headedness, numbness and headaches.   Hematological: Negative for adenopathy.   Psychiatric/Behavioral: Negative for behavioral problems and agitation. The patient is not nervous/anxious.        Filed Vitals:    09/15/14 1332   BP: 132/83   Pulse: 90   Temp: 97.9 ??F (36.6 ??C)   Resp: 18   Height: 5' 9.5" (1.765 m)   Weight: 108.863 kg (240 lb)   SpO2: 97%            Physical Exam   Constitutional: He is oriented to person, place, and time. He appears well-developed and well-nourished. No distress.   HENT:   Head: Normocephalic and atraumatic.   Right Ear: External ear normal.   Left Ear: External ear normal.   Nose: Nose normal.   Eyes: Conjunctivae and EOM are normal.    Neck: Normal range of motion. Neck supple.   Cardiovascular: Normal rate, regular rhythm and normal heart sounds.    +2 nonpitting edema lower extremities bilaterally   Pulmonary/Chest: Effort normal. No respiratory distress. He has wheezes in the right lower field. He has no rales.       Abdominal: Soft. Bowel sounds are normal. He exhibits distension. There is tenderness in the periumbilical area. There is no rigidity, no rebound, no guarding and no CVA tenderness.       Musculoskeletal: Normal range of motion.   Neurological: He is alert and oriented to person, place, and time. Coordination normal.   Pt is orientated x 4 however is has trouble answering simple questions, seems confused about question, goes on tangents without answering question.     Skin: Skin is dry. No rash noted. He is not diaphoretic. No erythema. No pallor.   Psychiatric: He has a normal mood and affect. His behavior is normal. Judgment and thought content normal.   Nursing note and vitals reviewed.       MDM  Number of Diagnoses or Management Options  Alcoholic cirrhosis of liver with ascites ([place]): established and worsening  Elevated INR:   Hyperammonemia ([place]): established and worsening  Thrombocytopenia ([place]): established and worsening  Umbilical hernia, recurrence not specified:   Diagnosis management comments: DDX:  Elevated ammonia, thrombocytopenia, ascites, incarcerated hernia, dehydration, electrolyte abnormality, bowel obstruction    MDM:  Called patient's pharmacy patient has had no medications filled since March 2016, and that was an old prescription he had filled late.   Chest xray - IMPRESSION:No acute cardiopulmonary process or significant interval change relative to prior examination. Bilateral symmetric nodular opacities favored to represent nipple shadows; consider repeat imaging with nipple markers if confirmation is desired  Cardiac panel - No elevated Troponin    CBC - WBC is 3.0, H&H is 10.3 and 33, improved from previous finding.  Platelets 46, consistent with previous findings.    BMP - No renal insufficiency ,CO2 is elevated at 33  LFTs - AST and ALT elevated at 79 and 69  Ammonia is elevated at 141, lactulose ordered 20g/80m  INR is elevated at 1.4  CT abd pelvis - Impression: ??  ??  1. Umbilical hernia contains fat, mesenteric/omental vessels, and small amount  of fluid possibly from ascites versus inflammation.  ??  2. The appendix is not confidently identified, but no pericecal inflammation.  ??  3. Diffuse urinary bladder wall thickening, appears to be more than expected for  underdistention. Recommend correlation for possibility of cystitis.  ??  4. Cirrhotic liver morphology and splenomegaly. Trace ascites.  -Mild gastric and esophageal varices.  -Subcentimeter hypodense lesions in the liver too small to characterize.  -Mild gallbladder wall thickening and periportal adenopathy are probably  reactive.  ??  5. Moderate fecal retention, possibly constipated. Nonspecific fluid-filled  nondistended loops of small bowel, possibly mild enteritis.  CONSULT NOTE:   8:20 PM  I spoke with Dr Wynetta Emery   Specialty: Internal Medicine   Discussed pt's hx, disposition, and available diagnostic and imaging results. Reviewed care plans. Consulting physician agrees with plans as outlined.  Admission for further evaluation/treatment of hyperammonemia, thrombocytopenia.   Written by Catha Brow NP-C      Procedures               RESULTS:    No orders to display       Labs Reviewed   URINALYSIS W/ RFLX MICROSCOPIC - Abnormal; Notable for the following:     Ketone TRACE (*)     Bilirubin SMALL (*)     Leukocyte Esterase TRACE (*)     All other components within normal limits   URINE MICROSCOPIC ONLY - Abnormal; Notable for the following:     Bacteria FEW (*)     Mucus 1+ (*)     All other components within normal limits   CBC WITH AUTOMATED DIFF - Abnormal; Notable for the following:      WBC 3.0 (*)     RBC 3.75 (*)     HGB 10.3 (*)     HCT 33.0 (*)     RDW 18.3 (*)     PLATELET 46 (*)     All other components within normal limits   METABOLIC PANEL, BASIC - Abnormal; Notable for the following:     CO2 33 (*)     Anion gap 2 (*)     Glucose 113 (*)     BUN/Creatinine ratio 11 (*)     Calcium 7.4 (*)     All other components within normal limits   HEPATIC FUNCTION PANEL - Abnormal; Notable for the following:     Albumin 3.2 (*)     Bilirubin, total 1.5 (*)     Bilirubin, direct 0.5 (*)     AST 79 (*)     ALT 69 (*)     All other components within normal limits   LIPASE   AMMONIA       Recent Results (from the past 12 hour(s))   URINALYSIS W/ RFLX MICROSCOPIC    Collection Time: 09/15/14  2:15 PM   Result Value Ref Range    Color DARK YELLOW      Appearance CLEAR      Specific gravity 1.030 1.005 - 1.030      pH (UA) 5.5 5.0 - 8.0      Protein NEGATIVE  NEG mg/dL    Glucose NEGATIVE  NEG mg/dL    Ketone TRACE (A) NEG mg/dL    Bilirubin SMALL (A) NEG      Blood NEGATIVE  NEG      Urobilinogen 1.0 0.2 - 1.0 EU/dL    Nitrites NEGATIVE  NEG      Leukocyte Esterase TRACE (A) NEG     URINE MICROSCOPIC ONLY  Collection Time: 09/15/14  2:15 PM   Result Value Ref Range    WBC 1 to 3 0 - 4 /hpf    RBC 0 to 3 0 - 5 /hpf    Epithelial cells FEW 0 - 5 /lpf    Bacteria FEW (A) NEG /hpf    Mucus 1+ (A) NEG /lpf   CBC WITH AUTOMATED DIFF    Collection Time: 09/15/14  3:30 PM   Result Value Ref Range    WBC 3.0 (L) 4.6 - 13.2 K/uL    RBC 3.75 (L) 4.70 - 5.50 M/uL    HGB 10.3 (L) 13.0 - 16.0 g/dL    HCT 33.0 (L) 36.0 - 48.0 %    MCV 88.0 74.0 - 97.0 FL    MCH 27.5 24.0 - 34.0 PG    MCHC 31.2 31.0 - 37.0 g/dL    RDW 18.3 (H) 11.6 - 14.5 %    PLATELET 46 (L) 135 - 420 K/uL    MPV 11.6 9.2 - 11.8 FL    NEUTROPHILS PENDING %    LYMPHOCYTES PENDING %    MONOCYTES PENDING %    EOSINOPHILS PENDING %    BASOPHILS PENDING %    ABS. NEUTROPHILS PENDING K/UL    ABS. LYMPHOCYTES PENDING K/UL    ABS. MONOCYTES PENDING K/UL     ABS. EOSINOPHILS PENDING K/UL    ABS. BASOPHILS PENDING K/UL    DF PENDING    METABOLIC PANEL, BASIC    Collection Time: 09/15/14  3:30 PM   Result Value Ref Range    Sodium 139 136 - 145 mmol/L    Potassium 3.6 3.5 - 5.5 mmol/L    Chloride 104 100 - 108 mmol/L    CO2 33 (H) 21 - 32 mmol/L    Anion gap 2 (L) 3.0 - 18 mmol/L    Glucose 113 (H) 74 - 99 mg/dL    BUN 10 7.0 - 18 MG/DL    Creatinine 0.92 0.6 - 1.3 MG/DL    BUN/Creatinine ratio 11 (L) 12 - 20      GFR est AA >60 >60 ml/min/1.34m    GFR est non-AA >60 >60 ml/min/1.763m   Calcium 7.4 (L) 8.5 - 10.1 MG/DL   HEPATIC FUNCTION PANEL    Collection Time: 09/15/14  3:30 PM   Result Value Ref Range    Protein, total 6.6 6.4 - 8.2 g/dL    Albumin 3.2 (L) 3.4 - 5.0 g/dL    Globulin 3.4 2.0 - 4.0 g/dL    A-G Ratio 0.9 0.8 - 1.7      Bilirubin, total 1.5 (H) 0.2 - 1.0 MG/DL    Bilirubin, direct 0.5 (H) 0.0 - 0.2 MG/DL    Alk. phosphatase 83 45 - 117 U/L    AST 79 (H) 15 - 37 U/L    ALT 69 (H) 16 - 61 U/L   LIPASE    Collection Time: 09/15/14  3:30 PM   Result Value Ref Range    Lipase 171 73 - 393 U/L       PROGRESS NOTE:   4:00 PM  Initial assessment completed.  Written by SaCatha BrowP-C     DISPOSITION:  8: 25  Patient to be admitted to medical for further of hyperammoniemia, and thrombocytopenia.  Admitted to internal medicine Dr JoWynetta Emery     9:35 PM  GI specialist Dr NeDaivd Councilas been paged a few time, has not return page yet.  Will add  to treatment team for consult.  Secretary given my cell phone number so I can be reached at home when page is returned.     Written by Catha Brow NP-C

## 2014-09-15 NOTE — ED Notes (Signed)
Pt states he  Needs refills on his meds and states had to miss his last appointment due to work and when he spoke with his MD was told to come to the ER.

## 2014-09-15 NOTE — ED Notes (Signed)
Pt remains awake/alert/oriented (answers all orientation questions appropriately)/conversant.  Affect remains calm, skin warm/dry, respirations regular/non /non labored.  No distress noted. IV site clear/flushed/locked.  Rails up x 2.  Plan of care explained; pt voiced understanding.

## 2014-09-15 NOTE — ED Notes (Signed)
Report received/care assumed from [facility] RN.  Pt affect is calm, skin warm/dry, respirations regular/non labored.  Watching TV without distress.  Pt informed that provider needs to review CT scan before determination of further plan of care is to be determined.  Pt non verbal signs indicate no pain; has requested medication prior to transfer should he be admitted.

## 2014-09-15 NOTE — Other (Addendum)
Pt xferred to [facility] room 452 from [facility]. Pt transported via stretcher. AAOx4. RA. 2o gauge int right hand. Pt denies pain and nad noted.     0030 Notified. Dr. Laural Benes that patient is here from [facility].     0119 Security came to the room to lock pt's valuables up. 5 20 dollar bills and 2 credit cards were sent to the safe. Pt was given yellow carbon copy of pt valuable receipt and white copy was placed on chart    0725 Bedside verbal shift report given to Marlaine Hind, RN(oncoming nurse) by Caryl Never, RN(offgong nurse). Report consist of SBAR, MAR, KARDEX,I/O

## 2014-09-15 NOTE — ED Notes (Signed)
Pt affect is calm, skin warm/dry, respirations regular/non labored.  Eating meal without distress.  Pt informed he has a bed; preparing for transfer.  No distress noted.

## 2014-09-16 LAB — PTT: aPTT: 33.3 s (ref 23.0–36.4)

## 2014-09-16 LAB — PROTHROMBIN TIME + INR
INR: 1.4 — ABNORMAL HIGH (ref 0.8–1.2)
Prothrombin time: 16.2 s — ABNORMAL HIGH (ref 11.5–15.2)

## 2014-09-16 MED ORDER — MORPHINE 2 MG/ML INJECTION
2 mg/mL | INTRAMUSCULAR | Status: DC | PRN
Start: 2014-09-16 — End: 2014-09-17
  Administered 2014-09-16 – 2014-09-17 (×8): via INTRAVENOUS

## 2014-09-16 MED ORDER — THIAMINE HCL 100 MG TAB
100 mg | Freq: Every day | ORAL | Status: DC
Start: 2014-09-16 — End: 2014-09-18
  Administered 2014-09-17 – 2014-09-18 (×2): via ORAL

## 2014-09-16 MED ORDER — MORPHINE 4 MG/ML SYRINGE
4 mg/mL | INTRAMUSCULAR | Status: AC
Start: 2014-09-16 — End: 2014-09-15
  Administered 2014-09-16: 01:00:00 via INTRAVENOUS

## 2014-09-16 MED ORDER — PANTOPRAZOLE 40 MG TAB, DELAYED RELEASE
40 mg | Freq: Every day | ORAL | Status: DC
Start: 2014-09-16 — End: 2014-09-16
  Administered 2014-09-16: 14:00:00 via ORAL

## 2014-09-16 MED ORDER — PROMETHAZINE 25 MG/ML INJECTION
25 mg/mL | Freq: Four times a day (QID) | INTRAMUSCULAR | Status: DC | PRN
Start: 2014-09-16 — End: 2014-09-18

## 2014-09-16 MED ORDER — PANTOPRAZOLE 40 MG TAB, DELAYED RELEASE
40 mg | Freq: Every day | ORAL | Status: DC
Start: 2014-09-16 — End: 2014-09-18
  Administered 2014-09-17 – 2014-09-18 (×2): via ORAL

## 2014-09-16 MED ORDER — SODIUM CHLORIDE 0.9 % IJ SYRG
INTRAMUSCULAR | Status: DC | PRN
Start: 2014-09-16 — End: 2014-09-18

## 2014-09-16 MED ORDER — FUROSEMIDE 20 MG TAB
20 mg | Freq: Every day | ORAL | Status: DC
Start: 2014-09-16 — End: 2014-09-18
  Administered 2014-09-16 – 2014-09-18 (×3): via ORAL

## 2014-09-16 MED ORDER — SPIRONOLACTONE 100 MG TAB
100 mg | Freq: Every day | ORAL | Status: DC
Start: 2014-09-16 — End: 2014-09-18
  Administered 2014-09-17 – 2014-09-18 (×2): via ORAL

## 2014-09-16 MED ORDER — RIFAXIMIN 550 MG TAB
550 mg | Freq: Every day | ORAL | Status: DC
Start: 2014-09-16 — End: 2014-09-18
  Administered 2014-09-17 – 2014-09-18 (×2): via ORAL

## 2014-09-16 MED ORDER — FOLIC ACID 1 MG TAB
1 mg | Freq: Every day | ORAL | Status: DC
Start: 2014-09-16 — End: 2014-09-18
  Administered 2014-09-17 – 2014-09-18 (×2): via ORAL

## 2014-09-16 MED ORDER — SODIUM CHLORIDE 0.9 % IJ SYRG
Freq: Three times a day (TID) | INTRAMUSCULAR | Status: DC
Start: 2014-09-16 — End: 2014-09-18
  Administered 2014-09-16 – 2014-09-18 (×7): via INTRAVENOUS

## 2014-09-16 MED ORDER — LACTULOSE 10 GRAM/15 ML ORAL SOLN
10 gram/15 mL | Freq: Three times a day (TID) | ORAL | Status: DC
Start: 2014-09-16 — End: 2014-09-16

## 2014-09-16 MED ORDER — MULTIVITAMIN ORAL LIQUID
Freq: Every day | ORAL | Status: DC
Start: 2014-09-16 — End: 2014-09-18
  Administered 2014-09-16 – 2014-09-18 (×3): via ORAL

## 2014-09-16 MED ORDER — FERROUS SULFATE 325 MG (65 MG ELEMENTAL IRON) TAB
325 mg (65 mg iron) | Freq: Every day | ORAL | Status: DC
Start: 2014-09-16 — End: 2014-09-18
  Administered 2014-09-17 – 2014-09-18 (×2): via ORAL

## 2014-09-16 MED ORDER — SODIUM CHLORIDE 0.9 % IV
25 mg/mL | Freq: Four times a day (QID) | INTRAVENOUS | Status: DC | PRN
Start: 2014-09-16 — End: 2014-09-16

## 2014-09-16 MED ORDER — LACTULOSE 10 GRAM/15 ML ORAL SOLUTION
10 gram/15 mL | Freq: Three times a day (TID) | ORAL | Status: DC
Start: 2014-09-16 — End: 2014-09-16
  Administered 2014-09-16: 14:00:00 via ORAL

## 2014-09-16 MED ORDER — LACTULOSE 10 GRAM/15 ML ORAL SOLUTION
10 gram/15 mL | Freq: Three times a day (TID) | ORAL | Status: DC
Start: 2014-09-16 — End: 2014-09-18
  Administered 2014-09-16 – 2014-09-18 (×6): via ORAL

## 2014-09-16 MED ORDER — ONDANSETRON (PF) 4 MG/2 ML INJECTION
4 mg/2 mL | INTRAMUSCULAR | Status: DC | PRN
Start: 2014-09-16 — End: 2014-09-18

## 2014-09-16 MED ORDER — BISACODYL 5 MG TAB, DELAYED RELEASE
5 mg | Freq: Every day | ORAL | Status: DC | PRN
Start: 2014-09-16 — End: 2014-09-18

## 2014-09-16 MED FILL — MORPHINE 2 MG/ML INJECTION: 2 mg/mL | INTRAMUSCULAR | Qty: 1

## 2014-09-16 MED FILL — PANTOPRAZOLE 40 MG TAB, DELAYED RELEASE: 40 mg | ORAL | Qty: 1

## 2014-09-16 MED FILL — FUROSEMIDE 20 MG TAB: 20 mg | ORAL | Qty: 1

## 2014-09-16 MED FILL — LACTULOSE 20 GRAM/30 ML ORAL SOLUTION: 20 gram/30 mL | ORAL | Qty: 30

## 2014-09-16 MED FILL — BD POSIFLUSH NORMAL SALINE 0.9 % INJECTION SYRINGE: INTRAMUSCULAR | Qty: 10

## 2014-09-16 MED FILL — MORPHINE 4 MG/ML SYRINGE: 4 mg/mL | INTRAMUSCULAR | Qty: 1

## 2014-09-16 MED FILL — MULTI-DELYN ORAL LIQUID: ORAL | Qty: 5

## 2014-09-16 NOTE — H&P (Signed)
[facility]                               [address] [place], [place] [address]                               HISTORY AND PHYSICAL    PATIENT:      Todd, Wallace  MRN:             [id number]      DATE:   09/15/2014  BILLING:         [id number]   DOB:    15-Jun-1950  ROOM:         [id number]  REFERRING:  DICTATING:    Adela Ports, MD        CHIEF COMPLAINT: The patient was noted more confused than baseline.    HISTORY OF PRESENT ILLNESS: The patient a 64 year old male who has a  history of hepatitis C and alcoholic cirrhosis of the liver, who has had  complications in the past including ascites and upper GI bleeding requiring  banding. He has gastric and esophageal varices. He was admitted to the  emergency room after he was brought in with some confusion. His ammonia  level was noted markedly elevated at 141. He was more confused than his  baseline. There was concern of developing ascites, however, a CT scan of  his abdomen only showed trace ascites, did show on umbilical hernia that  contained fat and mesenteric omental vessels and a small amount of fluid,  possibly from ascites versus inflammation. He had no pericecal  inflammation; however, his appendix was not confidently identified. He had  diffuse urinary bladder wall thickening with the possibility of cystitis  and his liver appeared cirrhotic in morphology and he also has  splenomegaly. He had mild gastric and esophageal varices. There was mild  gallbladder wall thickening and periportal adenopathy that was felt  probably reactive. He had moderate fecal retention, suggesting possible  constipation. He was admitted to a general medical floor and was started on  lactulose. He had run out of medications that was previously prescribed for  him some time ago, as his work schedule has made it difficult for him to   maintain his appointments with Dr. Reesa Chew and he does not have a primary  care provider at this time.    PAST MEDICAL HISTORY: Significant for alcoholic cirrhosis, esophageal  varices, gastric varices, history of GI bleeding with banding of varices.  He has a history of hepatitis C viral infection. He has had problems with  alcohol abuse in the past. He has an umbilical hernia and chronic anemia.  He has benign essential hypertension. He has chronic thrombocytopenia.    SURGICAL HISTORY: Significant for banding of varices.    ALLERGIES: HE HAS NO KNOWN ALLERGIES, BUT HE AVOIDS TYLENOL OR  ACETAMINOPHEN-CONTAINING PRODUCTS.    MEDICATIONS  His medications that he was prescribed to take prior to this admission  include:  1. Protonix 40 mg daily.  2. Ferrous sulfate 325 mg 1 tab by mouth daily.  3. Folic acid 1 mg by mouth daily.  4. Lactulose 10 g/15 mL, 15 mL p.o. 3 times daily.  5. Rifaximin  550 mg p.o. daily.  6. Aldactone 100 mg p.o. daily.  7. Thiamine 100 mg daily.  8. Multivitamin 1 capsule p.o. daily.    Of note, however, he has not taken any of these medicines in the last week  or so and possibly longer.    SOCIAL HISTORY: He is single. He quit smoking tobacco 20 years ago. He  states he continues to drink alcohol, but only rarely. He states he  occasionally drinks a Coors Light. 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 His contact person is Elray Buba whose  phone number is [phone].    CODE STATUS: HE IS FULL CODE STATUS AT THIS TIME.    REVIEW OF SYSTEMS: He states that he has pain in his abdomen. He denies  having any melenic stool or bright-red blood per rectum. He has not had any  hematemesis. He denies any excessive bruising. He has not had any  epistaxis. No oozing of blood from his gums. He has not had any subjective  fevers, shaking chills, night sweats. No cough or sputum production. He  denies having any pain in his back or flank areas. No dysuria, gross   hematuria, or increased frequency. He states that he has not noticed any  significant difference in his bowel movements. He has not had any chest  pain, palpitations. No shortness of breath. No skin rash or pruritus. His  remaining 14-point review of systems is negative.    PHYSICAL EXAMINATION  GENERAL: He is a moderately obese white male, who appears his stated age.  He is in no distress.  VITAL SIGNS: His temperature is 97.5, pulse 87, respirations 16, blood  pressure is 116/73, O2 saturations 94%.  SKIN: Without jaundice. There is no pallor.  HEENT: Normocephalic, atraumatic. His pupils equally round, react to light  and accommodation. Sclerae anicteric. His extraocular movements are intact.  His oral cavity is moist. Tongue protruded midline. Uvula was midline.  NECK: Supple. No JVD, carotid bruits, or goiter. No cervical  lymphadenopathy.  LUNGS: Have breath sounds bilaterally. There was no wheezing or rales. No  areas of consolidation. No dullness on percussion.  HEART: Regular rate and rhythm. There was no S3 gallop, murmur, or rub.  Normal S1, S2.  ABDOMEN: Soft. There was no abdominal distention. He had a negative  Murphy's sign. Some mild tenderness with deep palpation in the  periumbilical area. His umbilical hernia was reducible. His bowel sounds  were present, normoactive. He had no hepatomegaly. There was some  splenomegaly.  BACK: Midline spine. He had no CVA or point tenderness.  EXTREMITIES: No clubbing, cyanosis. He had trace edema. No palpable cords.  He moved all extremities, had dorsalis pedis pulses palpable bilaterally,  and he had normal capillary refill.  NEUROLOGIC: He was alert. He was oriented x3, however, and he was  occasionally an unreliable historian, although he gave overall a reasonable  history. Just occasionally wound go off on tangents. He seemed otherwise  fairly appropriate, had goal directed thoughts. He had no asterixis and no   other focal neurologic deficits on exam. His gait was steady.    LABORATORY DATA: His white count was 3, hemoglobin was 10.3, hematocrit was  33, platelets were 46, which was higher than his prior platelet level of 42  back in 04/2014. His sodium was 139, potassium 3.6, chloride was 104, CO2  was 33, BUN 10, creatinine was 0.92, glucose was 113. His alkaline  phosphatase was 83,  SGOT was 79, SGPT was 69. Albumin 3.2. Total protein  6.6. His direct bilirubin was 0.5, total bilirubin was 1.5. His ammonia  level was 141. His total CK was 489, index was 1.9, troponin was 0.02. BNP  was 63.    IMPRESSION AND PLAN  1. Hepatic encephalopathy. The patient will be treated with lactulose 20  g/30 mL p.o. 3 times daily. Will get a repeat ammonia level tomorrow a.m.  2. Hepatic alcoholic cirrhosis. The patient continues to drink, although he  states rarely. Will try to encourage the patient to stop drinking alcohol  altogether. I spent time discussing this with him during this H and P and  we will continue to encourage drinking cessation throughout hospital course  and perhaps refer the patient to AA following release from the hospital.  3. Hyperammonemia. His ammonia level is up to 141, likely secondary to  noncompliance with medications, which Dr. Reesa Chew had previously prescribed  for the patient. The patient reports having difficulty maintaining his  appointments secondary to work requirements. Will encourage the patient to  comply with physician followup appointments as possible and certainly to  make arrangements to refill his medications. Dr. Reesa Chew will also be  consulted during this hospital stay in order to give recommendations  regarding any adjustments in his medications that may be required at this  time.  4. Trace ascites. The patient reports some abdominal pain. I have a low  suspicion for spontaneous bacterial peritonitis. He is not febrile. His   white count is low, but in the range that it typically has been of recent.  He does not appear toxic at this time; however, there was finding on CT  scan of some thickening of his bladder that could be consistent with  cystitis; therefore, I will treat the patient empirically given that he has  cirrhosis of liver and possible early cystitis. I will treat him with  Rocephin 1 gram q.24 hours.  5. Mild gastritis and esophageal varices. No evidence of active GI bleed at  this time. The patient will be maintained on Protonix 40 mg daily.                     Adela Ports, MD    WRJ:wmx  D: 09/16/2014  11:43 A  T: 09/16/2014  12:34 P  Job: [id number]  CScriptDoc #: [id number]  cc:   Adela Ports, MD        Philemon Kingdom, MD

## 2014-09-16 NOTE — Other (Signed)
Interdisciplinary Round Note   Patient Information: Patient Information: [name]                                      [id number]   Reason for Admission: Hyperammonemia (HCC)  Hepatic encephalopathy [facility](HCC)   Attending Provider:   Elayne GuerinWilliam Johnson III, MD  Primary Care Physician:       Westley GamblesLauren J James, MD       [phone]   Past Medical History:   Past Medical History   Diagnosis Date   ??? Hypertension    ??? Ill-defined condition      anemia   ??? Anemia    ??? Varices    ??? Hepatitis C virus    ??? GI bleed    ??? Alcohol abuse    ??? Hernia       Hospital day: 1  Estimated discharge date: TBD  RRAT Score: High Risk            21       Total Score        9 More than 1 Admission in calendar year    4 Patient Insurance is Medicare, Medicaid or Self Pay    8 Charlson Comorbidity Score        Criteria that do not apply:    Relationship with PCP    Patient Living Status    Patient Length of Stay > 5       Goals for Today: Monitor ammonia levels, monitor for AMS, pain control      Overnight Events: none     VITAL SIGNS  Filed Vitals:    09/16/14 1600   BP: 120/77   Pulse: 78   Temp: 97.5 ??F (36.4 ??C)   Resp: 16   Height:    Weight:  SpO2: 91%          Lines, Drains, & Airways    Peripheral IV 09/15/14 Right Wrist-Site Assessment: Clean, dry, & intact       VTE Prophylaxis                                   Intake and Output:   07/25 0701 - 07/26 1900  In: 240 [P.O.:240]  Out: -     Current Diet: DIET CARDIAC Regular; 2 GM NA (House Low NA)       Abdominal   Abdominal Assessment: Ascites  Appetite: Good  Bowel Sounds: Active   GI Prophylaxis: yes        Type: protonix        Recent Glucose Results: No results found for: GLU, GLUPOC, GLUCPOC       IV Antibiotics? none       When started:    Activity Level:  Activity Level: Head of bed elevated (degrees), Up ad lib, Up with Assistance  Needs assistance with ADLs: yes  PT Consult Status: not ordered Current Immunizations:  Immunization History   Administered Date(s) Administered    ??? Influenza Vaccine 10/23/2011   ??? Influenza Vaccine PF 11/10/2012          Recommendations:   Discharge Disposition: Home Independent    Needs for Discharge: TBD Recommendations from IDR team: continue current plan of care    Other Notes: none

## 2014-09-16 NOTE — Consults (Signed)
[facility]                               [address] [place], [place] [address]                                CONSULTATION REPORT    PATIENT:    [name], [name]  MRN:            [id number]    ADMIT DATE:   09/15/2014  BILLING:        [id number] CONSULTED:    09/16/2014  ROOM:       [id number]  ATTENDING:  Adela Ports, MD  DICTATING:  Caroleen Hamman, MD        REASON FOR CONSULTATION: Advise opinion regarding cirrhosis of liver.    HISTORY OF PRESENT ILLNESS: A 64 year old male with hep C, cirrhosis and  alcohol use.  However, he was noted to be confused, had high ammonia. Given  his noncompliance with office followup, he was sent to the ER and was  admitted. He says he is feeling better.  He does have some periumbilical  pain from the ventral hernia that he has.  He denies nausea, vomiting,  melena, hematemesis.    PAST MEDICAL HISTORY: As above.    FAMILY HISTORY/SOCIAL HISTORY: The patient quit smoking in the past.  Continues to drink alcohol.    ALLERGIES: NO KNOWN DRUG ALLERGIES.    MEDICATIONS: Have been listed in the chart, reviewed by me.    REVIEW OF SYSTEMS:  No chest pain, PND, orthopnea. No hematemesis, melena.    PHYSICAL EXAMINATION:  GENERAL: Fairly built male.  VITAL SIGNS: Afebrile, has stable vitals.  HEENT: Head normocephalic, atraumatic.  EYES: No icterus.  NECK: Supple.  LUNGS: Clear to auscultation.  HEART: Both sounds normal, rhythm regular.  ABDOMEN: Soft. Mild ascites noted.  EXTREMITIES: Edema was remarkable.  NEUROLOGIC: Communicates well. No mood swings.    LABORATORY DATA: Reviewed by me, low platelet count noted.    ASSESSMENT AND PLAN: Cirrhosis with hepatic encephalopathy, improved.  Continue lactulose, Xifaxan, and continue diuretics. Will require imaging  study every 6 months for hepatoma surveillance.  Umbilical hernia.  Portal  hypertension suggested by ascites and gastric and esophageal varices.                      Caroleen Hamman, MD    ASD:wmx  D: 09/16/2014 T: 09/16/2014 03:07 P  Job: [id number]  CScriptDoc #: [id number]  cc:   Caroleen Hamman, MD        Adela Ports, MD

## 2014-09-16 NOTE — Progress Notes (Signed)
NUTRITION    BPA/MST Referral      RECOMMENDATIONS / PLAN:      - Continue with current nutrition intervention      NUTRITION INTERVENTIONS:      Meals/Snacks: Modified diet, monitor po intake    Medical food supplementation    Vitamin and mineral supplementation: MVI    Nutrition-related medication management: lactulose, lasix, zofran, phenergan   Nutrition counseling/education   Coordination of nutrition care   Continue inpatient monitoring and intervention     ASSESSMENT:     Subjective/Objective:  Patient reported good appetite, good meal intake PTA and since admission. Denied having concerns at time of visit     Diet: DIET CARDIAC Regular; 2 GM NA (House Low NA)    Food Allergies:  None known   Chewing/Swallowing Difficulty:   None known      Yes:  Meal Intake: No data found.      BM:  7/25  Skin Integrity:  No pressure ulcer or wound  Edema:  Abdominal ascites  Pertinent Medications: Reviewed     Labs:  Recent Labs      09/15/14   1530   NA  139   K  3.6   CL  104   CO2  33*   GLU  113*   BUN  10   CREA  0.92   CA  7.4*   ALB  3.2*   SGOT  79*   ALT  69*       No intake or output data in the 24 hours ending 09/16/14 1546    Anthropometrics:  Ht Readings from Last 1 Encounters:   09/15/14 5' 9.5" (1.765 m)       Last 3 Recorded Weights in this Encounter    09/15/14 1332 09/16/14 1200   Weight: 108.863 kg (240 lb) 109.77 kg (242 lb)       Body mass index is 35.24 kg/(m^2).      Weight History:  Pt reported having weight gain PTA    Weight Metrics 09/16/2014 05/20/2014 05/09/2014 05/04/2014 04/22/2014 01/22/2014 03/25/2013   Weight 242 lb 227 lb 4.8 oz 240 lb 230 lb 225 lb 240 lb 237 lb 3.2 oz   BMI 35.24 kg/m2 32.61 kg/m2 34.44 kg/m2 33 kg/m2 32.28 kg/m2 34.95 kg/m2 34.03 kg/m2          Admitting Diagnosis: Hyperammonemia (HCC)  Hepatic encephalopathy ([place])  Past Medical History   Diagnosis Date   ??? Hypertension    ??? Ill-defined condition      anemia   ??? Anemia    ??? Varices    ??? Hepatitis C virus    ??? GI bleed     ??? Alcohol abuse    ??? Hernia        Education Needs:         None identified   Identified - Not appropriate at this time    Identified and addressed - refer to education log  Learning Limitations:    None identified   Identified    Cultural, religious & ethnic food preferences:   None identified     Identified and addressed     NUTRITION NEEDS & DIAGNOSIS:      Estimated Nutrition Needs:   Calories: 2284-2474 kcal (MSJx1.2-1.3) based on   Actual BW:  110 kg     IBW:   CHO: 285-309 gm (50% kcal)   Protein: 66-88 gm (0.6-0.8 gm/kg) based on   Actual BW:  110 kg  IBW:   Fluid: 1 mL/kcal     Patient expected to meet estimated nutrient needs:    Yes     No      Nutrition Diagnosis:  No nutrition diagnosis at this time     MONITORING & EVALUATION:     Nutrition Goal(s):  1. Po intake of meals will meet >75% of patient estimated nutritional needs within the next 5-7 days.    Previous Goal(s) Outcome (for follow-up assessments only):    Met      Not Met        Previous Recommendations (for follow-up assessments only):        Implemented          Not Implemented (RD to address)      No Recommendation Made     Discharge Planning:  Cardiac diet     Participated in care planning, discharge planning, & interdisciplinary rounds as appropriate      Coralee North, RD   Pager: [phone]

## 2014-09-16 NOTE — Progress Notes (Signed)
Care Management Interventions  PCP Verified by CM:  (Patient agreed to f/u with Dr. Shelly Coss as PCP.)  Mode of Transport at Discharge: BLS  Transition of Care Consult (CM Consult): Other  Discharge Durable Medical Equipment:  (Denied use of DME in the past.)  Current Support Network: Other (Patient stated that he resides with his life partner.)  Confirm Follow Up Transport: Friends  Plan discussed with Pt/Family/Caregiver: Yes  Discharge Location  Discharge Placement: Home     Patient is alert and oriented x 4; stated that he resides with life partner and plans to return home upon discharge; self-care with adls/iadls; denied use of dme; friend will provide transportation from the hospital; will assist as needed.

## 2014-09-16 NOTE — Other (Signed)
Patient: [name] Age: 64 y.o. Sex: male     Bedside and Verbal shift change report given to Animal nutritionist (oncoming nurse) by Renato Shin, LPN (offgoing nurse). Report included the following information SBAR, Kardex, MAR and Recent Results.  PATIENT GOALS FOR TODAY PATIENT PRIORITIES FOR TODAY   Goals are: Monitor ammonia levels, monitor for AMS, pain control  Updated on Whiteboard in Patient???s Room: no   Patient states his/her priorities are: go home  Updated on Whiteboard in Patient???s Room: no     SITUATION:   Code Status: Full Code Reason for Admission: Hyperammonemia (HCC)  Hepatic encephalopathy [facility])   Hospital day: 1 Problem List: Active Problems:    Hyperammonemia (HCC) (09/15/2014)      Hepatic encephalopathy (HCC) (09/16/2014)       Attending Provider:   Elayne Guerin III, MD Allergies: No Known Allergies   BACKGROUND:   Past Medical History:   Past Medical History   Diagnosis Date   ??? Hypertension    ??? Ill-defined condition      anemia   ??? Anemia    ??? Varices    ??? Hepatitis C virus    ??? GI bleed    ??? Alcohol abuse    ??? Hernia      ASSESSMENT:   Neuro:   ,   CV:  Patient on Telemetry:          Cardiac Rhythm: Normal sinus rhythm  Patient has a pace maker:   Endocrine   Recent Glucose Results: No results found for: GLU, GLUPOC, GLUCPOC     Respiratory:  O2 Device: Room air       Supplemental O2         Incentive Spirometery          GI  Current diet: DIET CARDIAC Regular; 2 GM NA (House Low NA)            Abdominal Assessment: Ascites               Bowel Sounds: Active  GU          Reasons if patient has a foley:    Patient Safety  Restraints:    Family notified:    Other Alternatives:    Fall Risk   Total Score: 2   Safety Measures: Bed/Chair-Wheels locked, Bed in low position, Call light within reach VTE Prophylaxis                WOUND (if present)  Wound Type:    Dressing present Dressing Present : No  Wound Concerns/Notes:  IV ACCESS     Reasons if patient has a central line:      PAIN   Pain Assessment  Pain Intensity 1: 10 (09/16/14 1844)  Pain Location 1: Abdomen  Pain Intervention(s) 1: Medication (see MAR), Repositioned, Rest, Emotional support  Patient Stated Pain Goal: 0  Time of last intervention: 1843   Reassessment Completed: no Last Vitals:  Filed Vitals:    09/16/14 1600   BP: 120/77   Pulse: 78   Temp: 97.5 ??F (36.4 ??C)   Resp: 16   Height:    Weight:    SpO2: 91%      Last 3 Weights:  Last 3 Recorded Weights in this Encounter    09/15/14 1332 09/16/14 1200   Weight: 108.863 kg (240 lb) 109.77 kg (242 lb)     Weight change:   LAB RESULTS  Recent Labs  09/15/14   1530   WBC  3.0*   HGB  10.3*   HCT  33.0*   PLT  46*     Recent Labs      09/15/14   2000  09/15/14   1530   NA   --   139   K   --   3.6   GLU   --   113*   BUN   --   10   CREA   --   0.92   CA   --   7.4*   INR  1.4*   --       RECOMMENDATIONS AND DISCHARGE PLANNING   1. Discharge plan for patient // Needs or barriers to disharge: TBD .  2. Estimated Discharge Date: TBD Posted on Whiteboard in Patient???s Room: no    "HEALS" SAFETY CHECK   A safety check occurred in the patient's room between off going nurse and oncoming nurse listed above.The safety check included the below items  Area Items   H  High Alert Meds  ? Verify all high alert medication drips (heparin, PCA, etc.)   E  Equipment ? Suction is set up for ALL patients (with yanker)  ? Red plugs utilized for all equipment (IV pumps, etc.)  ? WOW???s wiped down at end of shift.  ? Room stocked with oxygen, suction, and other unit-specific supplies   A  Alarms ? Bed alarm is set for fall risk patients  ? Ensure chair alarm is in place and activated if patient is up in a chair   L  Lines ? Check IV for any infiltration  ? Foley bag is empty if patient has a Foley   ? Tubing and IV bags are labeled   S  Safety   ? Room is clean, patient is clean, and equipment is clean.  ? Ensure room is clear and free of clutter  ? Hallways are clear from equipment besides carts.    ? Fall bracelet on for fall risk patients  ? Suction is set up for ALL patients (with yanker)  ? Isolation precautions followed, supplies available outside room, sign posted   Renato Shin, LPN

## 2014-09-16 NOTE — Progress Notes (Signed)
Chaplain conducted an initial consultation and Spiritual Assessment for [name], who is a 64 y.o.,male. Patient???s Primary Language is: [place].   According to the patient???s EMR Religious Affiliation is: [place].     The reason the Patient came to the hospital is:   Patient Active Problem List    Diagnosis Date Noted   ??? Hepatic encephalopathy (HCC) 09/16/2014   ??? Hyperammonemia (HCC) 09/15/2014   ??? Cirrhosis of liver with ascites (HCC) 05/09/2014   ??? Varices, esophageal (HCC) 03/25/2013   ??? Acute upper GI bleeding 01/20/2013   ??? Hematemesis 01/19/2013   ??? Melena 01/19/2013   ??? Thrombocytopenia (HCC) 01/19/2013   ??? Alcoholic cirrhosis (HCC) 01/19/2013   ??? Chest pain 01/19/2013   ??? Anemia 11/08/2012   ??? Alcohol abuse 03/11/2012   ??? Thrombocytopenia, unspecified (HCC) 03/10/2012   ??? Hepatitis C 12/23/2011   ??? Alcoholic cirrhosis of liver (HCC) 07/26/2011        The Chaplain provided the following Interventions:  Patient seemed to be in good spirits.  Patient expressed appreciation for the Chaplain's visit, and asked for prayers on his behalf.  Chart reviewed.    The following outcomes where achieved:  Patient shared limited information about both their medical narrative and spiritual journey/beliefs.  Patient processed feeling about current hospitalization.  Patient expressed gratitude for chaplain's visit.    Assessment:  Patient does not have any religious/cultural needs that will affect patient???s preferences in health care.  There are no spiritual or religious issues which require intervention at this time.     Plan:  Chaplains will continue to follow and will provide pastoral care on an as needed/requested basis.  Chaplain recommends bedside caregivers page chaplain on duty if patient shows signs of acute spiritual or emotional distress.    Dorthula Rue. Sherlynn Stalls  Spiritual Care  [phone]

## 2014-09-17 LAB — METABOLIC PANEL, BASIC
Anion gap: 6 mmol/L (ref 3.0–18)
BUN/Creatinine ratio: 14 (ref 12–20)
BUN: 10 MG/DL (ref 7.0–18)
CO2: 32 mmol/L (ref 21–32)
Calcium: 7.8 MG/DL — ABNORMAL LOW (ref 8.5–10.1)
Chloride: 105 mmol/L (ref 100–108)
Creatinine: 0.73 MG/DL (ref 0.6–1.3)
GFR est AA: >60 mL/min/{1.73_m2} (ref >60)
GFR est non-AA: >60 mL/min/{1.73_m2} (ref >60)
Glucose: 110 mg/dL — ABNORMAL HIGH (ref 74–99)
Potassium: 3.8 mmol/L (ref 3.5–5.5)
Sodium: 143 mmol/L (ref 136–145)

## 2014-09-17 LAB — CBC WITH AUTOMATED DIFF
ABS. BASOPHILS: 0 10*3/uL (ref 0.0–0.1)
ABS. EOSINOPHILS: 0.3 10*3/uL (ref 0.0–0.4)
ABS. LYMPHOCYTES: 0.4 10*3/uL — ABNORMAL LOW (ref 0.9–3.6)
ABS. MONOCYTES: 0.6 10*3/uL (ref 0.05–1.2)
ABS. NEUTROPHILS: 1.6 10*3/uL — ABNORMAL LOW (ref 1.8–8.0)
BASOPHILS: 1 % (ref 0–2)
EOSINOPHILS: 9 % — ABNORMAL HIGH (ref 0–5)
HCT: 33.3 % — ABNORMAL LOW (ref 36.0–48.0)
HGB: 10.7 g/dL — ABNORMAL LOW (ref 13.0–16.0)
LYMPHOCYTES: 15 % — ABNORMAL LOW (ref 21–52)
MCH: 28.4 PG (ref 24.0–34.0)
MCHC: 32.1 g/dL (ref 31.0–37.0)
MCV: 88.3 FL (ref 74.0–97.0)
MONOCYTES: 21 % — ABNORMAL HIGH (ref 3–10)
MPV: 11.7 FL (ref 9.2–11.8)
NEUTROPHILS: 54 % (ref 40–73)
PLATELET: 41 10*3/uL — ABNORMAL LOW (ref 135–420)
RBC: 3.77 M/uL — ABNORMAL LOW (ref 4.70–5.50)
RDW: 18.2 % — ABNORMAL HIGH (ref 11.6–14.5)
WBC: 2.9 10*3/uL — ABNORMAL LOW (ref 4.6–13.2)

## 2014-09-17 LAB — AMMONIA: Ammonia, plasma: 180 umol/L — ABNORMAL HIGH (ref 11–32)

## 2014-09-17 MED ORDER — DIPHENHYDRAMINE 25 MG CAP
25 mg | Freq: Four times a day (QID) | ORAL | Status: DC | PRN
Start: 2014-09-17 — End: 2014-09-18
  Administered 2014-09-18 (×3): via ORAL

## 2014-09-17 MED ORDER — OXYCODONE 5 MG TAB
5 mg | Freq: Four times a day (QID) | ORAL | Status: DC | PRN
Start: 2014-09-17 — End: 2014-09-18
  Administered 2014-09-17 – 2014-09-18 (×4): via ORAL

## 2014-09-17 MED FILL — LACTULOSE 20 GRAM/30 ML ORAL SOLUTION: 20 gram/30 mL | ORAL | Qty: 300

## 2014-09-17 MED FILL — LACTULOSE 20 GRAM/30 ML ORAL SOLUTION: 20 gram/30 mL | ORAL | Qty: 30

## 2014-09-17 MED FILL — MORPHINE 2 MG/ML INJECTION: 2 mg/mL | INTRAMUSCULAR | Qty: 1

## 2014-09-17 MED FILL — PANTOPRAZOLE 40 MG TAB, DELAYED RELEASE: 40 mg | ORAL | Qty: 1

## 2014-09-17 MED FILL — FERROUS SULFATE 325 MG (65 MG ELEMENTAL IRON) TAB: 325 mg (65 mg iron) | ORAL | Qty: 1

## 2014-09-17 MED FILL — VITAMIN B-1 100 MG TABLET: 100 mg | ORAL | Qty: 1

## 2014-09-17 MED FILL — FOLIC ACID 1 MG TAB: 1 mg | ORAL | Qty: 1

## 2014-09-17 MED FILL — MULTI-DELYN ORAL LIQUID: ORAL | Qty: 5

## 2014-09-17 MED FILL — SPIRONOLACTONE 100 MG TAB: 100 mg | ORAL | Qty: 1

## 2014-09-17 MED FILL — FUROSEMIDE 20 MG TAB: 20 mg | ORAL | Qty: 1

## 2014-09-17 MED FILL — OXYCODONE 5 MG TAB: 5 mg | ORAL | Qty: 1

## 2014-09-17 MED FILL — XIFAXAN 550 MG TABLET: 550 mg | ORAL | Qty: 1

## 2014-09-17 NOTE — Progress Notes (Signed)
Feels well. Swelling is going down. Good appetite.  PE: BP 133/80 mmHg   Pulse 88   Temp(Src) 97.2 ??F (36.2 ??C)   Resp 18   Ht 5' 9.5" (1.765 m)   Wt 109.77 kg (242 lb)   BMI 35.24 kg/m2   SpO2 98%  Abdomen benign  Neuro non focal  Recent Results (from the past 12 hour(s))   AMMONIA    Collection Time: 09/17/14  1:58 AM   Result Value Ref Range    Ammonia 180 (H) 11 - 32 UMOL/L   CBC WITH AUTOMATED DIFF    Collection Time: 09/17/14  1:58 AM   Result Value Ref Range    WBC 2.9 (L) 4.6 - 13.2 K/uL    RBC 3.77 (L) 4.70 - 5.50 M/uL    HGB 10.7 (L) 13.0 - 16.0 g/dL    HCT 29.5 (L) 62.1 - 48.0 %    MCV 88.3 74.0 - 97.0 FL    MCH 28.4 24.0 - 34.0 PG    MCHC 32.1 31.0 - 37.0 g/dL    RDW 30.8 (H) 65.7 - 14.5 %    PLATELET 41 (L) 135 - 420 K/uL    MPV 11.7 9.2 - 11.8 FL    NEUTROPHILS 54 40 - 73 %    LYMPHOCYTES 15 (L) 21 - 52 %    MONOCYTES 21 (H) 3 - 10 %    EOSINOPHILS 9 (H) 0 - 5 %    BASOPHILS 1 0 - 2 %    ABS. NEUTROPHILS 1.6 (L) 1.8 - 8.0 K/UL    ABS. LYMPHOCYTES 0.4 (L) 0.9 - 3.6 K/UL    ABS. MONOCYTES 0.6 0.05 - 1.2 K/UL    ABS. EOSINOPHILS 0.3 0.0 - 0.4 K/UL    ABS. BASOPHILS 0.0 0.0 - 0.1 K/UL    DF AUTOMATED     METABOLIC PANEL, BASIC    Collection Time: 09/17/14  1:58 AM   Result Value Ref Range    Sodium 143 136 - 145 mmol/L    Potassium 3.8 3.5 - 5.5 mmol/L    Chloride 105 100 - 108 mmol/L    CO2 32 21 - 32 mmol/L    Anion gap 6 3.0 - 18 mmol/L    Glucose 110 (H) 74 - 99 mg/dL    BUN 10 7.0 - 18 MG/DL    Creatinine 8.46 0.6 - 1.3 MG/DL    BUN/Creatinine ratio 14 12 - 20      GFR est AA >60 >60 ml/min/1.51m2    GFR est non-AA >60 >60 ml/min/1.1m2    Calcium 7.8 (L) 8.5 - 10.1 MG/DL     A/P: Cirrhosis with confusion. Confusion resolved. Continue lactulose and xifaxan. When discharged can follow with Dr. Reesa Chew. Will sign off. Call us as needed.    A Saki Legore MD

## 2014-09-17 NOTE — Other (Signed)
Patient: [name] Age: 64 y.o. Sex: male     Bedside and Verbal shift change report given to Phaedra RN (oncoming nurse) by Daisy Blossom, RN (offgoing nurse). Report included the following information SBAR, Kardex, MAR and Recent Results.  PATIENT GOALS FOR TODAY PATIENT PRIORITIES FOR TODAY   Goals are: pain management monitor ammonia level  Updated on Whiteboard in Patient???s Room: no   Patient states his/her priorities are: be a better christian  Updated on Whiteboard in Patient???s Room: no     SITUATION:   Code Status: Full Code Reason for Admission: Hyperammonemia (HCC)  Hepatic encephalopathy [facility])   Hospital day: 2 Problem List: Active Problems:    Hyperammonemia (HCC) (09/15/2014)      Hepatic encephalopathy (HCC) (09/16/2014)       Attending Provider:   Elayne Guerin III, MD Allergies: No Known Allergies   BACKGROUND:   Past Medical History:   Past Medical History   Diagnosis Date   ??? Hypertension    ??? Ill-defined condition      anemia   ??? Anemia    ??? Varices    ??? Hepatitis C virus    ??? GI bleed    ??? Alcohol abuse    ??? Hernia      ASSESSMENT:   Neuro:   ,   CV:  Patient on Telemetry:          Cardiac Rhythm: Normal sinus rhythm  Patient has a pace maker:   Endocrine   Recent Glucose Results:   Lab Results   Component Value Date/Time    GLU 110* 09/17/2014 01:58 AM        Respiratory:  O2 Device: Room air       Supplemental O2         Incentive Spirometery          GI  Current diet: DIET CARDIAC Regular; 2 GM NA (House Low NA)            Abdominal Assessment: Ascites               Bowel Sounds: Active  GU          Reasons if patient has a foley: n/a   Patient Safety  Restraints:    Family notified:    Other Alternatives:    Fall Risk   Total Score: 1   Safety Measures: Bed/Chair-Wheels locked, Bed in low position, Call light within reach, Emergency bedside equipment, Fall prevention (comment), Gripper socks, Side rails X2, Side rails X 3 VTE Prophylaxis                 WOUND (if present)  Wound Type:  none  Dressing present Dressing Present : No  Wound Concerns/Notes: n/a IV ACCESS     Reasons if patient has a central line: n/a     PAIN  Pain Assessment  Pain Intensity 1: 8 (09/17/14 0359)  Pain Location 1: Abdomen  Pain Intervention(s) 1: Medication (see MAR)  Patient Stated Pain Goal: 0  Time of last intervention: 0305   Reassessment Completed: yes Last Vitals:  Filed Vitals:    09/17/14 0733   BP: 137/83   Pulse: 72   Temp: 97.2 ??F (36.2 ??C)   Resp: 16   Height:    Weight:    SpO2: 96%      Last 3 Weights:  Last 3 Recorded Weights in this Encounter    09/15/14 1332 09/16/14 1200   Weight: 295.621  kg (240 lb) 109.77 kg (242 lb)     Weight change: 0.907 kg (2 lb)  LAB RESULTS  Recent Labs      09/17/14   0158  09/15/14   1530   WBC  2.9*  3.0*   HGB  10.7*  10.3*   HCT  33.3*  33.0*   PLT  41*  46*     Recent Labs      09/17/14   0158  09/15/14   2000  09/15/14   1530   NA  143   --   139   K  3.8   --   3.6   GLU  110*   --   113*   BUN  10   --   10   CREA  0.73   --   0.92   CA  7.8*   --   7.4*   INR   --   1.4*   --       RECOMMENDATIONS AND DISCHARGE PLANNING   1. Discharge plan for patient // Needs or barriers to disharge: home .  2. Estimated Discharge Date: 09/19/14 Posted on Whiteboard in Patient???s Room: no    "HEALS" SAFETY CHECK   A safety check occurred in the patient's room between off going nurse and oncoming nurse listed above.The safety check included the below items  Area Items   H  High Alert Meds  ? Verify all high alert medication drips (heparin, PCA, etc.)   E  Equipment ? Suction is set up for ALL patients (with yanker)  ? Red plugs utilized for all equipment (IV pumps, etc.)  ? WOW???s wiped down at end of shift.  ? Room stocked with oxygen, suction, and other unit-specific supplies   A  Alarms ? Bed alarm is set for fall risk patients  ? Ensure chair alarm is in place and activated if patient is up in a chair   L  Lines ? Check IV for any infiltration   ? Foley bag is empty if patient has a Foley   ? Tubing and IV bags are labeled   S  Safety   ? Room is clean, patient is clean, and equipment is clean.  ? Ensure room is clear and free of clutter  ? Hallways are clear from equipment besides carts.   ? Fall bracelet on for fall risk patients  ? Suction is set up for ALL patients (with yanker)  ? Isolation precautions followed, supplies available outside room, sign posted   Daisy Blossom, RN

## 2014-09-17 NOTE — Other (Signed)
Bedside and Verbal shift change report given to Morrie SheldonAshley RN (oncoming nurse) by Perfecto Kingdomaelene J Bonner, RN (offgoing nurse). Report included the following information SBAR, Kardex, MAR and Recent Results.    SITUATION:   ? Code Status: Full Code  ? Reason for Admission: Hyperammonemia (HCC)  ? Hepatic encephalopathy (HCC)    ? Hospital day: 2  ? Problem List:       Hospital Problems  Date Reviewed: 05/19/2014          Codes Class Noted POA    Hepatic encephalopathy (HCC) ICD-10-CM: K72.90  ICD-9-CM: 572.2  09/16/2014 Unknown        Hyperammonemia (HCC) ICD-10-CM: E72.20  ICD-9-CM: 270.6  09/15/2014 Unknown              BACKGROUND:    Past Medical History:   Past Medical History   Diagnosis Date   ??? Hypertension    ??? Ill-defined condition      anemia   ??? Anemia    ??? Varices    ??? Hepatitis C virus    ??? GI bleed    ??? Alcohol abuse    ??? Hernia          Patient taking anticoagulants no     ASSESSMENT:   ? Changes in Assessment Throughout Shift: none    ? Patient has Central Line: no Reasons if yes: N/A  ? Patient has Foley Cath: no Reasons if yes: N/A     ? Last Vitals:     Filed Vitals:    09/17/14 1514   BP: 137/75   Pulse: 83   Temp: 97.6 ??F (36.4 ??C)   Resp: 20   Height:    Weight:    SpO2: 96%       ? IV and DRAINS (will only show if present)   Peripheral IV 09/15/14 Right Wrist-Site Assessment: Clean, dry, & intact    ? WOUND (if present)   Wound Type:  none   Dressing present Dressing Present : No   Wound Concerns/Notes:  none    ? PAIN    Pain Assessment    Pain Intensity 1: 0 (09/17/14 1600)    Pain Location 1: Abdomen    Pain Intervention(s) 1: Medication (see MAR)    Patient Stated Pain Goal: 0  o Interventions for Pain:  See MAR  o Intervention effective: yes  o Time of last intervention: 1800   o Reassessment Completed: yes     ? Last 3 Weights:  Last 3 Recorded Weights in this Encounter    09/15/14 1332 09/16/14 1200   Weight: 108.863 kg (240 lb) 109.77 kg (242 lb)     Weight change: 0.907 kg (2 lb)    ? INTAKE/OUPUT     Current Shift: 07/27 0701 - 07/27 1900  In: 1220 [P.O.:1220]  Out: 250 [Urine:250]    Last three shifts: 07/25 1901 - 07/27 0700  In: 600 [P.O.:600]  Out: 500 [Urine:500]    ? LAB RESULTS     Recent Labs      09/17/14   0158  09/15/14   1530   WBC  2.9*  3.0*   HGB  10.7*  10.3*   HCT  33.3*  33.0*   PLT  41*  46*        Recent Labs      09/17/14   0158  09/15/14   2000  09/15/14   1530   NA  143   --  139   K  3.8   --   3.6   GLU  110*   --   113*   BUN  10   --   10   CREA  0.73   --   0.92   CA  7.8*   --   7.4*   INR   --   1.4*   --        RECOMMENDATIONS AND DISCHARGE PLANNING     1. Pending tests/procedures/ Plan of Care or Other Needs: Labs , IV ABX     2. Discharge plan for patient and Needs/Barriers: Labs , Pos Home Health     3. Estimated Discharge Date: TBD Posted on Whiteboard in Patient???s Room: yes      4. The patient's care plan was reviewed with the oncoming nurse.       "HEALS" SAFETY CHECK      Fall Risk    Total Score: 1    Safety Measures: Safety Measures: Bed/Chair-Wheels locked, Bed in low position, Call light within reach    A safety check occurred in the patient's room between off going nurse and oncoming nurse listed above.    The safety check included the below items  Area Items   H  High Alert Medications ? Verify all high alert medication drips (heparin, PCA, etc.)   E  Equipment ? Suction is set up for ALL patients (with yanker)  ? Red plugs utilized for all equipment (IV pumps, etc.)  ? WOW???s wiped down at end of shift.  ? Room stocked with oxygen, suction, and other unit-specific supplies   A  Alarms ? Bed alarm is set for fall risk patients  ? Ensure chair alarm is in place and activated if patient is up in a chair   L  Lines ? Check IV for any infiltration  ? Foley bag is empty if patient has a Foley   ? Tubing and IV bags are labeled   S  Safety   ? Room is clean, patient is clean, and equipment is clean.  ? Hallways are clear from equipment besides carts.    ? Fall bracelet on for fall risk patients  ? Ensure room is clear and free of clutter  ? Suction is set up for ALL patients (with yanker)  ? Hallways are clear from equipment besides carts.   ? Isolation precautions followed, supplies available outside room, sign posted     Raelene Illene Silver, RN

## 2014-09-17 NOTE — Other (Deleted)
Please clarify that hepatic encephalopathy  is either:    acute    subacute                    Thank you !!    =>  QUESTIONS?   Jolene Provost RN BS CCDS [phone]

## 2014-09-17 NOTE — Progress Notes (Signed)
Bedside and Verbal shift change report given to Raelene RN (oncoming nurse) by Wyline Mood, RN (offgoing nurse). Report included the following information SBAR, Kardex, MAR and Recent Results.    SITUATION:   ? Code Status: Full Code  ? Reason for Admission: Hyperammonemia (HCC)  ? Hepatic encephalopathy (HCC)    ? Hospital day: 2  ? Problem List:       Hospital Problems  Date Reviewed: 06/05/2014          Codes Class Noted POA    Hepatic encephalopathy (HCC) ICD-10-CM: K72.90  ICD-9-CM: 572.2  09/16/2014 Unknown        Hyperammonemia (HCC) ICD-10-CM: E72.20  ICD-9-CM: 270.6  09/15/2014 Unknown              BACKGROUND:    Past Medical History:   Past Medical History   Diagnosis Date   ??? Hypertension    ??? Ill-defined condition      anemia   ??? Anemia    ??? Varices    ??? Hepatitis C virus    ??? GI bleed    ??? Alcohol abuse    ??? Hernia          Patient taking anticoagulants no     ASSESSMENT:   ? Changes in Assessment Throughout Shift: none    ? Patient has Central Line: no   ? Patient has Foley Cath: no      ? Last Vitals:     Filed Vitals:    09/17/14 1514   BP: 137/75   Pulse: 83   Temp: 97.6 ??F (36.4 ??C)   Resp: 20   Height:    Weight:    SpO2: 96%       ? IV and DRAINS (will only show if present)   Peripheral IV 09/15/14 Right Wrist-Site Assessment: Clean, dry, & intact    ? WOUND (if present)   Wound Type:  scratches   Dressing present Dressing Present : No   Wound Concerns/Notes:  none    ? PAIN    Pain Assessment    Pain Intensity 1: 0 (09/17/14 1403)    Pain Location 1: Abdomen    Pain Intervention(s) 1: Medication (see MAR)    Patient Stated Pain Goal: 0  o Interventions for Pain:  Morphine    o Intervention effective: yes  o Time of last intervention: 1304   o Reassessment Completed: yes     ? Last 3 Weights:  Last 3 Recorded Weights in this Encounter    09/15/14 1332 09/16/14 1200   Weight: 108.863 kg (240 lb) 109.77 kg (242 lb)     Weight change: 0.907 kg (2 lb)    ? INTAKE/OUPUT     Current Shift: 07/27 0701 - 07/27 1900  In: 980 [P.O.:980]  Out: 250 [Urine:250]    Last three shifts: 07/25 1901 - 07/27 0700  In: 600 [P.O.:600]  Out: 500 [Urine:500]    ? LAB RESULTS     Recent Labs      09/17/14   0158  09/15/14   1530   WBC  2.9*  3.0*   HGB  10.7*  10.3*   HCT  33.3*  33.0*   PLT  41*  46*        Recent Labs      09/17/14   0158  09/15/14   2000  09/15/14   1530   NA  143   --   139   K  3.8   --   3.6   GLU  110*   --   113*   BUN  10   --   10   CREA  0.73   --   0.92   CA  7.8*   --   7.4*   INR   --   1.4*   --        RECOMMENDATIONS AND DISCHARGE PLANNING     1. Pending tests/procedures/ Plan of Care or Other Needs: labs     2. Discharge plan for patient and Needs/Barriers: home    3. Estimated Discharge Date: 09/19/2014 Posted on Whiteboard in Patient???s Room: yes      4. The patient's care plan was reviewed with the oncoming nurse.       "HEALS" SAFETY CHECK      Fall Risk    Total Score: 1    Safety Measures: Safety Measures: Bed/Chair-Wheels locked, Bed in low position, Call light within reach, Fall prevention (comment), Gripper socks    A safety check occurred in the patient's room between off going nurse and oncoming nurse listed above.    The safety check included the below items  Area Items   H  High Alert Medications ? Verify all high alert medication drips (heparin, PCA, etc.)   E  Equipment ? Suction is set up for ALL patients (with yanker)  ? Red plugs utilized for all equipment (IV pumps, etc.)  ? WOW???s wiped down at end of shift.  ? Room stocked with oxygen, suction, and other unit-specific supplies   A  Alarms ? Bed alarm is set for fall risk patients  ? Ensure chair alarm is in place and activated if patient is up in a chair   L  Lines ? Check IV for any infiltration  ? Foley bag is empty if patient has a Foley   ? Tubing and IV bags are labeled   S  Safety   ? Room is clean, patient is clean, and equipment is clean.  ? Hallways are clear from equipment besides carts.    ? Fall bracelet on for fall risk patients  ? Ensure room is clear and free of clutter  ? Suction is set up for ALL patients (with yanker)  ? Hallways are clear from equipment besides carts.   ? Isolation precautions followed, supplies available outside room, sign posted     Todd Mellody MemosM Beckwith, RN

## 2014-09-17 NOTE — Other (Signed)
Patient Information: Patient Information: [name]  ?????????????????????????????????????????????????????????????????????? [birth year]  ?? Reason for Admission: Hyperammonemia (HCC)  Hepatic encephalopathy (HCC)??   Attending Provider:???? Bevelyn BucklesWilliam Johnson III, MD  Primary Care Physician:  ???????? Westley GamblesLauren J James, MD  ???????? [phone]  ?? Past Medical History:   Past Medical History??   Diagnosis?? Date??   ????? Hypertension?? ??   ????? Ill-defined condition?? ??   ?? ?? anemia??   ????? Anemia?? ??   ????? Varices?? ??   ????? Hepatitis C virus?? ??   ????? GI bleed?? ??   ????? Alcohol abuse?? ??   ????? Hernia?? ??   ??   Hospital day: 1  Estimated discharge date: TBD  RRAT Score: High Risk?? ??  ???????? ??  ??21???????? ??  Total Score?? ??  ????  ??9 More than 1 Admission in calendar year ??  ??4 Patient Insurance is Medicare, Medicaid or Self Pay ??  ??8 Charlson Comorbidity Score ??  ?? ??  ??Criteria that do not apply: ??  ??Relationship with PCP ??  ??Patient Living Status ??  ??Patient Length of Stay > 5 ??  ??  ?? Goals for Today: Monitor ammonia levels, monitor for AMS, pain control      Overnight Events: none  ??   VITAL SIGNS  Filed Vitals:??   ?? 09/16/14 1600??   BP:?? 120/77??   Pulse:?? 78??   Temp:?? 97.5 ??F (36.4 ??C)??   Resp:?? 16??   Height:?? ??   Weight:?? ??   SpO2:?? 91%??         ?? Lines, Drains, & Airways    Peripheral IV 09/15/14 Right Wrist-Site Assessment: Clean, dry, & intact    ??   VTE Prophylaxis  ????????    ????????    ????????    ????????  ??   Intake and Output: ??  07/25 0701 - 07/26 1900  In: 240 [P.O.:240]  Out: -    ?? Current Diet: DIET CARDIAC Regular; 2 GM NA (House Low NA)???????? ??  Abdominal ??  Abdominal Assessment: Ascites  Appetite: Good  Bowel Sounds: Active   GI Prophylaxis: yes  ?????????? Type: protonix???????? ??   Recent Glucose Results: No results found for: GLU, GLUPOC, GLUCPOC    ??   IV Antibiotics? none  ???????? When started: ??   Activity Level:  Activity Level: Head of bed elevated (degrees), Up ad lib, Up with Assistance  Needs assistance with ADLs: yes  PT Consult Status: not ordered?? Current Immunizations:   Immunization History??   Administered?? Date(s) Administered??   ????? Influenza Vaccine?? 10/23/2011??   ????? Influenza Vaccine PF?? 11/10/2012??   ??   ?? ??   Recommendations:??   Discharge Disposition: Home Independent    Needs for Discharge: TBD?? Recommendations from IDR team: continue current plan of care    Other Notes: none??      ??      ?? ??   ??

## 2014-09-17 NOTE — Progress Notes (Signed)
[facility] Hospitalist Group  Progress Note    Patient: [name] Age: 64 y.o. DOB: December 22, 1950 MR#: [id number] SSN: [id number]  Date/Time: 09/17/2014 1:31 PM    Subjective:     comfortable    Objective:   VS: BP 133/80 mmHg   Pulse 88   Temp(Src) 97.2 ??F (36.2 ??C)   Resp 18   Ht 5' 9.5" (1.765 m)   Wt 109.77 kg (242 lb)   BMI 35.24 kg/m2   SpO2 98%   Tmax/24hrs: Temp (24hrs), Avg:97.6 ??F (36.4 ??C), Min:97.2 ??F (36.2 ??C), Max:98 ??F (36.7 ??C)     Input/Output:   Intake/Output Summary (Last 24 hours) at 09/17/14 1331  Last data filed at 09/17/14 1258   Gross per 24 hour   Intake   1340 ml   Output    450 ml   Net    890 ml       General:  nad  Cardiovascular:  s1s2  Pulmonary:  clear  GI:  benign  Extremities:  No edema  Additional:  No skin changes  Neuro:  Non focal    Labs:    Recent Results (from the past 24 hour(s))   AMMONIA    Collection Time: 09/17/14  1:58 AM   Result Value Ref Range    Ammonia 180 (H) 11 - 32 UMOL/L   CBC WITH AUTOMATED DIFF    Collection Time: 09/17/14  1:58 AM   Result Value Ref Range    WBC 2.9 (L) 4.6 - 13.2 K/uL    RBC 3.77 (L) 4.70 - 5.50 M/uL    HGB 10.7 (L) 13.0 - 16.0 g/dL    HCT 78.2 (L) 95.6 - 48.0 %    MCV 88.3 74.0 - 97.0 FL    MCH 28.4 24.0 - 34.0 PG    MCHC 32.1 31.0 - 37.0 g/dL    RDW 21.3 (H) 08.6 - 14.5 %    PLATELET 41 (L) 135 - 420 K/uL    MPV 11.7 9.2 - 11.8 FL    NEUTROPHILS 54 40 - 73 %    LYMPHOCYTES 15 (L) 21 - 52 %    MONOCYTES 21 (H) 3 - 10 %    EOSINOPHILS 9 (H) 0 - 5 %    BASOPHILS 1 0 - 2 %    ABS. NEUTROPHILS 1.6 (L) 1.8 - 8.0 K/UL    ABS. LYMPHOCYTES 0.4 (L) 0.9 - 3.6 K/UL    ABS. MONOCYTES 0.6 0.05 - 1.2 K/UL    ABS. EOSINOPHILS 0.3 0.0 - 0.4 K/UL    ABS. BASOPHILS 0.0 0.0 - 0.1 K/UL    DF AUTOMATED     METABOLIC PANEL, BASIC    Collection Time: 09/17/14  1:58 AM   Result Value Ref Range    Sodium 143 136 - 145 mmol/L    Potassium 3.8 3.5 - 5.5 mmol/L    Chloride 105 100 - 108 mmol/L    CO2 32 21 - 32 mmol/L     Anion gap 6 3.0 - 18 mmol/L    Glucose 110 (H) 74 - 99 mg/dL    BUN 10 7.0 - 18 MG/DL    Creatinine 5.78 0.6 - 1.3 MG/DL    BUN/Creatinine ratio 14 12 - 20      GFR est AA >60 >60 ml/min/1.53m2    GFR est non-AA >60 >60 ml/min/1.48m2    Calcium 7.8 (L) 8.5 - 10.1 MG/DL     Additional Data Reviewed:  Current Facility-Administered Medications   Medication Dose Route Frequency   ??? morphine injection 2 mg  2 mg IntraVENous Q4H PRN   ??? promethazine (PHENERGAN) injection 12.5 mg  12.5 mg IntraVENous Q6H PRN   ??? ferrous sulfate tablet 325 mg  325 mg Oral ACB   ??? folic acid (FOLVITE) tablet 1 mg  1 mg Oral DAILY   ??? multivitamin (MULTI-DEYLIN) oral liquid 5 mL  5 mL Oral DAILY   ??? pantoprazole (PROTONIX) tablet 40 mg  40 mg Oral ACB   ??? rifaximin (XIFAXAN) tablet 550 mg  550 mg Oral DAILY   ??? spironolactone (ALDACTONE) tablet 100 mg  100 mg Oral DAILY   ??? thiamine (B-1) tablet 100 mg  100 mg Oral DAILY   ??? sodium chloride (NS) flush 5-10 mL  5-10 mL IntraVENous Q8H   ??? sodium chloride (NS) flush 5-10 mL  5-10 mL IntraVENous PRN   ??? ondansetron (ZOFRAN) injection 4 mg  4 mg IntraVENous Q4H PRN   ??? bisacodyl (DULCOLAX) tablet 5 mg  5 mg Oral DAILY PRN   ??? lactulose (CHRONULAC) solution 10 g  10 g Oral TID   ??? furosemide (LASIX) tablet 20 mg  20 mg Oral DAILY       Assessment/Plan:     Patient Active Problem List   Diagnosis Code   ??? Alcoholic cirrhosis of liver (HCC) K70.30   ??? Hepatitis C B19.20   ??? Thrombocytopenia, unspecified (HCC) D69.6   ??? Alcohol abuse F10.10   ??? Anemia D64.9   ??? Hematemesis K92.0   ??? Melena K92.1   ??? Thrombocytopenia (HCC) D69.6   ??? Alcoholic cirrhosis (HCC) K70.30   ??? Chest pain R07.9   ??? Acute upper GI bleeding K92.2   ??? 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 Cirrhosis of liver with ascites (HCC) K74.60   ??? Hyperammonemia (HCC) E72.20   ??? Hepatic encephalopathy (HCC) K72.90       Plan:    Cont lactulose and xifaxan  Lasix  Aldactone  Low salt diet    Case discussed with:  Patient  Family  Nursing  Case Management  DVT Prophylaxis:  Lovenox  Hep SQ  SCDs  Coumadin   On Heparin gtt    Signed By: Chauncey ReadingMICHAEL S Arelys Glassco, MD

## 2014-09-17 NOTE — Progress Notes (Addendum)
Spoke with Burnard Bunting Investment banker, corporate Program) [phone] re: assistance with Xifaxan for patient; representative stated that there is an application the patient has to complete and submitted to the [facility]. After the application is processed and approved, it will take at least 7-10 days before medication will be mailed to patient's home.    Patient given the application for Xifaxan Assistance Program to complete; verbalized understanding; will assist as needed.    3:20 pm  Estanislado Spire, RN, Manager-OM notified that patient does not have insurance and will need Xifaxan upon discharge; OM-Manager authorized 30 day supply of Xifaxan for patient.    5:00 pm  Patient Assistance form for Xifaxan has been completed by patient and MD; submitted to [facility] [phone]; will assist as needed.

## 2014-09-18 MED ORDER — RIFAXIMIN 550 MG TAB
550 mg | ORAL_TABLET | Freq: Every day | ORAL | Status: DC
Start: 2014-09-18 — End: 2015-08-16

## 2014-09-18 MED ORDER — LACTULOSE 10 GRAM/15 ML ORAL SOLN
10 gram/15 mL | Freq: Three times a day (TID) | ORAL | Status: DC
Start: 2014-09-18 — End: 2015-08-16

## 2014-09-18 MED ORDER — FOLIC ACID 1 MG TAB
1 mg | ORAL_TABLET | Freq: Every day | ORAL | Status: DC
Start: 2014-09-18 — End: 2015-08-16

## 2014-09-18 MED ORDER — THIAMINE HCL 100 MG TAB
100 mg | ORAL_TABLET | Freq: Every day | ORAL | Status: DC
Start: 2014-09-18 — End: 2015-08-16

## 2014-09-18 MED ORDER — PANTOPRAZOLE 40 MG TAB, DELAYED RELEASE
40 mg | ORAL_TABLET | Freq: Every day | ORAL | Status: DC
Start: 2014-09-18 — End: 2015-08-16

## 2014-09-18 MED ORDER — OXYCODONE 5 MG TAB
5 mg | ORAL_TABLET | Freq: Four times a day (QID) | ORAL | Status: DC | PRN
Start: 2014-09-18 — End: 2015-08-16

## 2014-09-18 MED ORDER — MULTIVITAMIN CAP
ORAL_CAPSULE | Freq: Every day | ORAL | Status: AC
Start: 2014-09-18 — End: ?

## 2014-09-18 MED ORDER — FERROUS SULFATE 325 MG (65 MG ELEMENTAL IRON) TAB
325 mg (65 mg iron) | ORAL_TABLET | Freq: Every day | ORAL | Status: AC
Start: 2014-09-18 — End: ?

## 2014-09-18 MED ORDER — SPIRONOLACTONE 100 MG TAB
100 mg | ORAL_TABLET | Freq: Every day | ORAL | Status: DC
Start: 2014-09-18 — End: 2015-08-16

## 2014-09-18 MED ORDER — FUROSEMIDE 20 MG TAB
20 mg | ORAL_TABLET | Freq: Every day | ORAL | Status: DC
Start: 2014-09-18 — End: 2015-08-16

## 2014-09-18 MED FILL — FERROUS SULFATE 325 MG (65 MG ELEMENTAL IRON) TAB: 325 mg (65 mg iron) | ORAL | Qty: 1

## 2014-09-18 MED FILL — OXYCODONE 5 MG TAB: 5 mg | ORAL | Qty: 1

## 2014-09-18 MED FILL — MULTI-DELYN ORAL LIQUID: ORAL | Qty: 5

## 2014-09-18 MED FILL — LACTULOSE 20 GRAM/30 ML ORAL SOLUTION: 20 gram/30 mL | ORAL | Qty: 30

## 2014-09-18 MED FILL — XIFAXAN 550 MG TABLET: 550 mg | ORAL | Qty: 1

## 2014-09-18 MED FILL — PANTOPRAZOLE 40 MG TAB, DELAYED RELEASE: 40 mg | ORAL | Qty: 1

## 2014-09-18 MED FILL — DIPHENHYDRAMINE 25 MG CAP: 25 mg | ORAL | Qty: 1

## 2014-09-18 MED FILL — FUROSEMIDE 20 MG TAB: 20 mg | ORAL | Qty: 1

## 2014-09-18 MED FILL — VITAMIN B-1 100 MG TABLET: 100 mg | ORAL | Qty: 1

## 2014-09-18 MED FILL — FOLIC ACID 1 MG TAB: 1 mg | ORAL | Qty: 1

## 2014-09-18 MED FILL — SPIRONOLACTONE 100 MG TAB: 100 mg | ORAL | Qty: 1

## 2014-09-18 NOTE — Progress Notes (Addendum)
[facility] Hospitalist Group  Progress Note    Patient: [name] Age: 64 y.o. DOB: 12/30/1950 MR#: [id number] SSN: [id number]  Date/Time: 09/18/2014 1:31 PM    Subjective:     No events, comfortable    Objective:   VS: BP 126/85 mmHg   Pulse 79   Temp(Src) 97.8 ??F (36.6 ??C)   Resp 18   Ht 5' 9.5" (1.765 m)   Wt 109.77 kg (242 lb)   BMI 35.24 kg/m2   SpO2 98%   Tmax/24hrs: Temp (24hrs), Avg:97.7 ??F (36.5 ??C), Min:97.2 ??F (36.2 ??C), Max:98.4 ??F (36.9 ??C)     Input/Output:     Intake/Output Summary (Last 24 hours) at 09/18/14 0920  Last data filed at 09/18/14 0919   Gross per 24 hour   Intake   1600 ml   Output    250 ml   Net   1350 ml       General:  nad  Cardiovascular:  s1s2  Pulmonary:  clear  GI:  benign  Extremities:  No edema  Additional:  No skin changes  Neuro:  Non focal    Labs:    No results found for this or any previous visit (from the past 24 hour(s)).  Additional Data Reviewed:      Current Facility-Administered Medications   Medication Dose Route Frequency   ??? diphenhydrAMINE (BENADRYL) capsule 25 mg  25 mg Oral Q6H PRN   ??? oxyCODONE IR (ROXICODONE) tablet 5 mg  5 mg Oral Q6H PRN   ??? promethazine (PHENERGAN) injection 12.5 mg  12.5 mg IntraVENous Q6H PRN   ??? ferrous sulfate tablet 325 mg  325 mg Oral ACB   ??? folic acid (FOLVITE) tablet 1 mg  1 mg Oral DAILY   ??? multivitamin (MULTI-DEYLIN) oral liquid 5 mL  5 mL Oral DAILY   ??? pantoprazole (PROTONIX) tablet 40 mg  40 mg Oral ACB   ??? rifaximin (XIFAXAN) tablet 550 mg  550 mg Oral DAILY   ??? spironolactone (ALDACTONE) tablet 100 mg  100 mg Oral DAILY   ??? thiamine (B-1) tablet 100 mg  100 mg Oral DAILY   ??? sodium chloride (NS) flush 5-10 mL  5-10 mL IntraVENous Q8H   ??? sodium chloride (NS) flush 5-10 mL  5-10 mL IntraVENous PRN   ??? ondansetron (ZOFRAN) injection 4 mg  4 mg IntraVENous Q4H PRN   ??? bisacodyl (DULCOLAX) tablet 5 mg  5 mg Oral DAILY PRN   ??? lactulose (CHRONULAC) solution 10 g  10 g Oral TID    ??? furosemide (LASIX) tablet 20 mg  20 mg Oral DAILY       Assessment/Plan:     Patient Active Problem List   Diagnosis Code   ??? Alcoholic cirrhosis of liver (HCC) K70.30   ??? Hepatitis C B19.20   ??? Thrombocytopenia, unspecified (HCC) D69.6   ??? Alcohol abuse F10.10   ??? Anemia D64.9   ??? Hematemesis K92.0   ??? Melena K92.1   ??? Thrombocytopenia (HCC) D69.6   ??? Alcoholic cirrhosis (HCC) K70.30   ??? Chest pain R07.9   ??? Acute upper GI bleeding K92.2   ??? 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 Cirrhosis of liver with ascites (HCC) K74.60   ??? Hyperammonemia (HCC) E72.20   ??? Hepatic encephalopathy (HCC) K72.90   acute hepatic encephalopathy    Plan:    Cont lactulose and xifaxan  Lasix  Aldactone  Low salt diet   Case  discussed with:  Patient  Family  Nursing  Case Management  DVT Prophylaxis:  Lovenox  Hep SQ  SCDs  Coumadin   On Heparin gtt    Signed By: Leonarda Salon, MD

## 2014-09-18 NOTE — Progress Notes (Signed)
Indigent medication forms completed and submitted to [facility] with prescriptions; Morrie Sheldon informed CM that the approval for Xifaxan has to be approved by Alcide Clever (Outpt. Diplomatic Services operational officer) as well as Estanislado Spire, RN, OM Manager. Ms. Blain Pais made aware and spoke with Mr. Bascom Levels to approve Xifaxan 550 mg po daily for 30 days; will assist as needed.

## 2014-09-18 NOTE — Discharge Summary (Signed)
[facility]                               [address] [place], [place] [address]                                 DISCHARGE SUMMARY    PATIENT:   [name], [name]  MRN:           [id number]    ADMIT DATE:    09/15/2014  BILLING:       [id number] [facility] DATE:     09/18/2014  ATTENDING: Adela Ports, MD  DICTATING  Chauncey Reading, MD      DISCHARGE DIAGNOSIS: Hepatic encephalopathy.    SECONDARY DIAGNOSES  1. Hepatitis C.  2. Cirrhosis.  3. Alcohol use.    CONSULTANTS: GI.    PROCEDURES: Chest x-ray 09/15/2014, no acute cardiopulmonary process or  significant interval change relative to prior exam. Bilateral symmetrical  nodular opacities favored to represent nipple shadows. Consider repeat  imaging with nipple markers.    HISTORY OF PRESENT ILLNESS AND HOSPITAL COURSE: The patient is a  64 year old male who has a past medical history as mentioned above. He was  sent over from Dr. Marnee Guarneri office as apparently he has not been able to  receive his medicines for his hepatic encephalopathy, to include his  lactulose and Xifaxan. He presents to [facility]. He was started back on the  lactulose, Xifaxan, folic acid, Lasix, multivitamin and Aldactone.  A low-salt diet. He was seen by Gastroenterology. His mentation has  improved during his hospitalization. Case manager was able to provide the  patient with his medications to include his Xifaxan. He is discharged home  today.    DISCHARGE MEDICATIONS  1. Xifaxan 550 daily.  2. Lactulose 10 g t.i.d.  3. Lasix 20 daily.  4. Folic acid 1 mg daily.  5. Iron 325 daily.  6. Thiamine B1, 100 daily.  7. Protonix 40 daily.  8. Multivitamin daily.    DISCHARGE INSTRUCTIONS: He is discharged home. He is on a low-salt diet. He  needs to follow up with his PCP and all consultants as scheduled.                     Chauncey Reading, MD    MSC:wmx  D: 09/18/2014 T: 09/18/2014 09:56 A   Job: [id number]  CScriptDoc #: [id number]  cc:   Chauncey Reading, MD        Caroleen Hamman, MD        Westley Gambles, MD        Adela Ports, MD        Philemon Kingdom, MD

## 2014-09-18 NOTE — Other (Signed)
[facility]       September 18, 2014       RE: JSEAN TAUSSIG      To Whom It May Concern,    This is to certify that HALIM SURRETTE may may return to work on 09/22/2014.    Please feel free to contact my office if you have any questions or concerns.  Thank you for your assistance in this matter.      Sincerely,  Verdis Frederickson, RN   Dr. Hillis Range, MD  [phone]

## 2014-09-18 NOTE — Progress Notes (Signed)
Cirrhosis: Care Instructions  Your Care Instructions     Cirrhosis occurs when healthy tissue in your liver gets scarred. This keeps the liver from working well. It usually happens after a liver has been inflamed for years.  Cirrhosis is most often caused by alcohol abuse or hepatitis infection. But there are other causes too. These include medicines and too much fat in the liver. Conditions passed down in families and other disorders can also cause it. In some cases, no cause can be found.  Treatment can't completely fix liver damage. But you may be able to slow or prevent more damage if you don't drink alcohol or use drugs that harm your liver.  Follow-up care is a key part of your treatment and safety. Be sure to make and go to all appointments, and call your doctor if you are having problems. It's also a good idea to know your test results and keep a list of the medicines you take.  How can you care for yourself at home?  ?? Do not drink any alcohol. It can harm your liver. Talk to your doctor if you need help to stop drinking.  ?? Be safe with medicines. Take your medicines exactly as prescribed. Call your doctor if you think you are having a problem with your medicine.  ?? Talk to your doctor before you take any other medicines. These include over-the-counter medicines and herbal products.  ?? Be careful taking acetaminophen (Tylenol), ibuprofen (Advil, Motrin), or naproxen (Aleve). These can sometimes cause more liver damage. Talk with your doctor if you're not sure which medicines are safe.  ?? If your cirrhosis causes extra fluid to build up in your body, try not to eat a lot of salt. Use less salt when you cook and at the table. Don't eat fast foods or snack foods with a lot of salt. Extra fluid in your belly, legs, and chest can cause serious problems.  ?? Work with your doctor or a dietitian to be sure you eat the right amount of carbohydrate, protein, fat, and sodium (salt). It's very important to  choose the best foods for the health of your liver.  ?? If your doctor recommends it, limit how much fluid you drink.  ?? If your doctor recommends it, get more exercise. Walking is a good choice. Bit by bit, increase the amount you walk every day. Try for at least 30 minutes on most days of the week. You also may want to swim, bike, or do other activities.  When should you call for help?  Call 911 anytime you think you may need emergency care. For example, call if:  ?? You passed out (lost consciousness).  Call your doctor now or seek immediate medical care if:  ?? You feel very sleepy or confused.  ?? You have new belly pain, or your pain gets worse.  ?? You have a fever.  ?? There is a new or increasing yellow tint to your skin or the whites of your eyes.  ?? You have any abnormal bleeding, such as:  ?? Nosebleeds.  ?? Vaginal bleeding that is different (heavier, more frequent, at a different time of the month) than what you are used to.  ?? Bloody or black stools, or rectal bleeding.  ?? Bloody or pink urine.  Watch closely for changes in your health, and be sure to contact your doctor if you have any problems.  Where can you learn more?  Go to http://www.healthwise.net/GoodHelpConnections  Enter M412 in   the search box to learn more about "Cirrhosis: Care Instructions."  ?? 2006-2016 Healthwise, Incorporated. Care instructions adapted under license by [employer] (which disclaims liability or warranty for this information). This care instruction is for use with your licensed healthcare professional. If you have questions about a medical condition or this instruction, always ask your healthcare professional. Healthwise, Incorporated disclaims any warranty or liability for your use of this information.  Content Version: 10.9.538570; Current as of: January 10, 2014          Clotting Factor Deficiencies: Care Instructions  Your Care Instructions      Clotting factors are substances in the blood that help stop bleeding after a cut or injury. They also prevent sudden bleeding. In people who have clotting factor problems, the clotting factors don't work right or, in some cases, are missing. When blood does not clot well, even minor injuries can cause serious bleeding. This can lead to blood loss, injury to internal organs, or damage to muscles or joints.  Several conditions, including hemophilia, can make it hard for the blood to clot. Your doctor can treat you by giving you replacement clotting factors. You also may take medicine to prevent bleeding. You may often have clotting factors transfused into a vein to prevent bleeding, or you may get them as needed. You may eventually learn to do this at home. You can also try to prevent injuries that can cause you to bleed.  Follow-up care is a key part of your treatment and safety. Be sure to make and go to all appointments, and call your doctor if you are having problems. It's also a good idea to know your test results and keep a list of the medicines you take.  How can you care for yourself at home?  ?? Take your medicines exactly as prescribed. Call your doctor if you think you are having a problem with your medicine. You will get more details on the specific medicines your doctor prescribes.  ?? Stay at a healthy body weight. If you are overweight, the additional stress on joints can trigger bleeding.  ?? Exercise safely. Avoid contact sports. Swim or walk to avoid excess pressure on your joints. Check with your doctor before doing activities that put you at high risk for falls, such as riding a bike.  ?? Brush and floss your teeth daily. This may help you avoid problems that could lead to having a tooth pulled.  ?? Avoid aspirin and nonsteroidal anti-inflammatory drugs (NSAIDs), such as ibuprofen (Advil, Motrin) or naproxen (Aleve). They can increase the chance of bleeding.  ?? Take pain medicines exactly as directed.   ?? If the doctor gave you a prescription medicine for pain, take it as prescribed.  ?? If you are not taking a prescription pain medicine, ask your doctor if you can take an over-the-counter medicine.  ?? Take care to prevent accidents at home:  ?? Make sure rugs are tacked down so you do not slip.  ?? Keep furniture with sharp edges out of pathways.  ?? Use nonskid floor wax.  ?? Wipe up spills quickly.  ?? If you live in an area that gets snow and ice in the winter, sprinkle salt on steps and sidewalks.  ?? Avoid loose-fitting shoes. You might lose your balance and fall.  ?? Wear medical alert jewelry that lists your clotting problem. You can buy this at most drugstores.  When should you call for help?  Call 911 anytime you think you  may need emergency care. For example, call if:  ?? You passed out (lost consciousness).  ?? You have a head injury.  ?? You have sudden, severe pain, especially in a joint.  ?? You have a sudden, severe headache that is different from past headaches.  Call your doctor now or seek immediate medical care if:  ?? You are dizzy or lightheaded, or you feel like you may faint.  ?? You are unable to stop bleeding by giving clotting factors after an injury.  ?? You have an injury but are not sure whether you need treatment.  ?? You have any abnormal bleeding, such as:  ?? Nosebleeds.  ?? Vaginal bleeding that is different (heavier, more frequent, at a different time of the month) than what you are used to.  ?? Bloody or black stools, or rectal bleeding.  ?? Bloody or pink urine.  Watch closely for changes in your health, and be sure to contact your doctor if:  ?? You have joint pain or swelling.  Where can you learn more?  Go to InsuranceStats.cahttp://www.healthwise.net/GoodHelpConnections  Enter 707-437-3333G993 in the search box to learn more about "Clotting Factor Deficiencies: Care Instructions."  ?? 2006-2016 Healthwise, Incorporated. Care instructions adapted under license by [employer] (which disclaims liability or warranty  for this information). This care instruction is for use with your licensed healthcare professional. If you have questions about a medical condition or this instruction, always ask your healthcare professional. Healthwise, Incorporated disclaims any warranty or liability for your use of this information.  Content Version: 10.9.538570; Current as of: March 28, 2014  Patient armband removed and shredded  MyChart Activation    Thank you for requesting access to MyChart. Please follow the instructions below to securely access and download your online medical record. MyChart allows you to send messages to your doctor, view your test results, renew your prescriptions, schedule appointments, and more.    How Do I Sign Up?    1. In your internet browser, go to [email or web address]  2. Click on the First Time User? Click Here link in the Sign In box. You will be redirect to the New Member Sign Up page.  3. Enter your MyChart Access Code exactly as it appears below. You will not need to use this code after you???ve completed the sign-up process. If you do not sign up before the expiration date, you must request a new code.    MyChart Access Code: [email or web address]  Expires: 12/17/2014 11:19 AM (This is the date your MyChart access code will expire)    4. Enter the last four digits of your Social Security Number (xxxx) and Date of Birth (mm/dd/yyyy) as indicated and click Submit. You will be taken to the next sign-up page.  5. Create a MyChart ID. This will be your MyChart login ID and cannot be changed, so think of one that is secure and easy to remember.  6. Create a MyChart password. You can change your password at any time.  7. Enter your Password Reset Question and Answer. This can be used at a later time if you forget your password.   8. Enter your e-mail address. You will receive e-mail notification when new information is available in MyChart.  9. Click Sign Up. You can now view and download portions of your medical  record.  10. Click the Download Summary menu link to download a portable copy of your medical information.    Additional Information    If you have questions,  please visit the Frequently Asked Questions section of the MyChart website at [email or web address] Remember, MyChart is NOT to be used for urgent needs. For medical emergencies, dial 911.        DISCHARGE SUMMARY from Nurse    The following personal items are in your possession at time of discharge:    Dental Appliances: None  Visual Aid: Glasses, At bedside     Home Medications: None  Jewelry: None  Clothing: Belt, Pants, Shirt  Other Valuables: Cell Phone  Personal Items Sent to Safe:  (personal pocket knife)          PATIENT INSTRUCTIONS:    After general anesthesia or intravenous sedation, for 24 hours or while taking prescription Narcotics:  ?? Limit your activities  ?? Do not drive and operate hazardous machinery  ?? Do not make important personal or business decisions  ?? Do  not drink alcoholic beverages  ?? If you have not urinated within 8 hours after discharge, please contact your surgeon on call.    Report the following to your surgeon:  ?? Excessive pain, swelling, redness or odor of or around the surgical area  ?? Temperature over 100.5  ?? Nausea and vomiting lasting longer than 4 hours or if unable to take medications  ?? Any signs of decreased circulation or nerve impairment to extremity: change in color, persistent  numbness, tingling, coldness or increase pain  ?? Any questions        What to do at Home:  Recommended activity: Activity as tolerated,     If you experience any of the following symptoms fever greater than 100, chest pain, nausea, vomiting and diarrhea, please follow up with PCP or ER.      *  Please give a list of your current medications to your Primary Care Provider.    *  Please update this list whenever your medications are discontinued, doses are       changed, or new medications (including over-the-counter products) are added.    *  Please carry medication information at all times in case of emergency situations.          These are general instructions for a healthy lifestyle:    No smoking/ No tobacco products/ Avoid exposure to second hand smoke    Surgeon General's Warning:  Quitting smoking now greatly reduces serious risk to your health.    Obesity, smoking, and sedentary lifestyle greatly increases your risk for illness    A healthy diet, regular physical exercise & weight monitoring are important for maintaining a healthy lifestyle    You may be retaining fluid if you have a history of heart failure or if you experience any of the following symptoms:  Weight gain of 3 pounds or more overnight or 5 pounds in a week, increased swelling in our hands or feet or shortness of breath while lying flat in bed.  Please call your doctor as soon as you notice any of these symptoms; do not wait until your next office visit.    Recognize signs and symptoms of STROKE:    F-face looks uneven    A-arms unable to move or move unevenly    S-speech slurred or non-existent    T-time-call 911 as soon as signs and symptoms begin-DO NOT go       Back to bed or wait to see if you get better-TIME IS BRAIN.    Warning Signs of HEART ATTACK     Call 911 if you  have these symptoms:  ? Chest discomfort. Most heart attacks involve discomfort in the center of the chest that lasts more than a few minutes, or that goes away and comes back. It can feel like uncomfortable pressure, squeezing, fullness, or pain.  ? Discomfort in other areas of the upper body. Symptoms can include pain or discomfort in one or both arms, the back, neck, jaw, or stomach.  ? Shortness of breath with or without chest discomfort.  ? Other signs may include breaking out in a cold sweat, nausea, or lightheadedness.  Don't wait more than five minutes to call 911 ??? MINUTES MATTER! Fast  action can save your life. Calling 911 is almost always the fastest way to get lifesaving treatment. Emergency Medical Services staff can begin treatment when they arrive ??? up to an hour sooner than if someone gets to the hospital by car.       The discharge information has been reviewed with the patient.  The patient verbalized understanding.    Discharge medications reviewed with the patient and appropriate educational materials and side effects teaching were provided.

## 2014-09-23 NOTE — Telephone Encounter (Signed)
[name] who stated Mr [name] is out of town. 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 She stated they are unsure about getting meds refilled, and need for PCP f/u was reinforced. She stated he will return in ~3weeks. Offered to notify Dr Fayrene FearingJames' office and reschedule when he is home, and gave Ms [name] my number and hours.

## 2014-09-25 ENCOUNTER — Encounter: Attending: Nurse Practitioner | Primary: Family Medicine

## 2014-10-07 ENCOUNTER — Encounter

## 2014-10-16 ENCOUNTER — Inpatient Hospital Stay
Admit: 2014-10-16 | Discharge: 2014-10-16 | Disposition: A | Discharge status: Home / Self Care | Payer: Self-pay | Attending: Emergency Medicine

## 2014-10-16 ENCOUNTER — Emergency Department: Admit: 2014-10-16 | Payer: Self-pay | Primary: Family Medicine

## 2014-10-16 DIAGNOSIS — G43A0 Cyclical vomiting, not intractable: Secondary | ICD-10-CM

## 2014-10-16 LAB — CBC WITH AUTOMATED DIFF
ABS. BASOPHILS: 0 10*3/uL (ref 0.0–0.1)
ABS. EOSINOPHILS: 0.4 10*3/uL (ref 0.0–0.4)
ABS. LYMPHOCYTES: 0.6 10*3/uL — ABNORMAL LOW (ref 0.9–3.6)
ABS. MONOCYTES: 0.7 10*3/uL (ref 0.05–1.2)
ABS. NEUTROPHILS: 3.2 10*3/uL (ref 1.8–8.0)
BASOPHILS: 0 % (ref 0–2)
EOSINOPHILS: 9 % — ABNORMAL HIGH (ref 0–5)
HCT: 37.9 % (ref 36.0–48.0)
HGB: 12.9 g/dL — ABNORMAL LOW (ref 13.0–16.0)
LYMPHOCYTES: 13 % — ABNORMAL LOW (ref 21–52)
MCH: 29.9 PG (ref 24.0–34.0)
MCHC: 34 g/dL (ref 31.0–37.0)
MCV: 87.9 FL (ref 74.0–97.0)
MONOCYTES: 15 % — ABNORMAL HIGH (ref 3–10)
MPV: 11.1 FL (ref 9.2–11.8)
NEUTROPHILS: 63 % (ref 40–73)
PLATELET COMMENTS: DECREASED
PLATELET: 39 10*3/uL — ABNORMAL LOW (ref 135–420)
RBC: 4.31 M/uL — ABNORMAL LOW (ref 4.70–5.50)
RDW: 17 % — ABNORMAL HIGH (ref 11.6–14.5)
WBC: 4.9 10*3/uL (ref 4.6–13.2)

## 2014-10-16 LAB — URINALYSIS W/ RFLX MICROSCOPIC
Blood: NEGATIVE
Glucose: NEGATIVE mg/dL
Ketone: NEGATIVE mg/dL
Leukocyte Esterase: NEGATIVE
Nitrites: NEGATIVE
Protein: NEGATIVE mg/dL
Specific gravity: 1.021 (ref 1.005–1.030)
Urobilinogen: 1 EU/dL (ref 0.2–1.0)
pH (UA): 5.5 (ref 5.0–8.0)

## 2014-10-16 LAB — METABOLIC PANEL, BASIC
Anion gap: 6 mmol/L (ref 3.0–18)
BUN/Creatinine ratio: 14 (ref 12–20)
BUN: 11 MG/DL (ref 7.0–18)
CO2: 29 mmol/L (ref 21–32)
Calcium: 8 MG/DL — ABNORMAL LOW (ref 8.5–10.1)
Chloride: 102 mmol/L (ref 100–108)
Creatinine: 0.81 MG/DL (ref 0.6–1.3)
GFR est AA: >60 mL/min/{1.73_m2} (ref >60)
GFR est non-AA: >60 mL/min/{1.73_m2} (ref >60)
Glucose: 78 mg/dL (ref 74–99)
Potassium: 3.6 mmol/L (ref 3.5–5.5)
Sodium: 137 mmol/L (ref 136–145)

## 2014-10-16 LAB — HEPATIC FUNCTION PANEL
A-G Ratio: 0.8 (ref 0.8–1.7)
ALT (SGPT): 82 U/L — ABNORMAL HIGH (ref 16–61)
AST (SGOT): 100 U/L — ABNORMAL HIGH (ref 15–37)
Albumin: 3.5 g/dL (ref 3.4–5.0)
Alk. phosphatase: 140 U/L — ABNORMAL HIGH (ref 45–117)
Bilirubin, direct: 0.6 MG/DL — ABNORMAL HIGH (ref 0.0–0.2)
Bilirubin, total: 1.3 MG/DL — ABNORMAL HIGH (ref 0.2–1.0)
Globulin: 4.3 g/dL — ABNORMAL HIGH (ref 2.0–4.0)
Protein, total: 7.8 g/dL (ref 6.4–8.2)

## 2014-10-16 LAB — PTT: aPTT: 31 s (ref 23.0–36.4)

## 2014-10-16 LAB — PROTHROMBIN TIME + INR
INR: 1.2 (ref 0.8–1.2)
Prothrombin time: 14.6 s (ref 11.5–15.2)

## 2014-10-16 LAB — LIPASE: Lipase: 145 U/L (ref 73–393)

## 2014-10-16 LAB — POC LACTIC ACID: Lactic Acid (POC): 0.5 mmol/L (ref 0–1.9)

## 2014-10-16 LAB — AMMONIA: Ammonia, plasma: 52 umol/L — ABNORMAL HIGH (ref 11–32)

## 2014-10-16 MED ORDER — ONDANSETRON (PF) 4 MG/2 ML INJECTION
4 mg/2 mL | INTRAMUSCULAR | Status: AC
Start: 2014-10-16 — End: 2014-10-16
  Administered 2014-10-16: 19:00:00 via INTRAVENOUS

## 2014-10-16 MED ORDER — NAPROXEN 500 MG TAB
500 mg | ORAL_TABLET | Freq: Two times a day (BID) | ORAL | 0 refills | Status: DC
Start: 2014-10-16 — End: 2015-08-16

## 2014-10-16 MED ORDER — ONDANSETRON 4 MG TAB, RAPID DISSOLVE
4 mg | ORAL_TABLET | Freq: Three times a day (TID) | ORAL | 0 refills | Status: DC | PRN
Start: 2014-10-16 — End: 2015-08-16

## 2014-10-16 MED ORDER — HYDROMORPHONE (PF) 1 MG/ML IJ SOLN
1 mg/mL | Freq: Once | INTRAMUSCULAR | Status: AC
Start: 2014-10-16 — End: 2014-10-16
  Administered 2014-10-16: 19:00:00 via INTRAVENOUS

## 2014-10-16 MED ORDER — SODIUM CHLORIDE 0.9% BOLUS IV
0.9 % | Freq: Once | INTRAVENOUS | Status: AC
Start: 2014-10-16 — End: 2014-10-16
  Administered 2014-10-16: 19:00:00 via INTRAVENOUS

## 2014-10-16 MED ORDER — HYDROMORPHONE (PF) 1 MG/ML IJ SOLN
1 mg/mL | Freq: Once | INTRAMUSCULAR | Status: AC
Start: 2014-10-16 — End: 2014-10-16
  Administered 2014-10-16: 20:00:00 via INTRAVENOUS

## 2014-10-16 MED FILL — HYDROMORPHONE (PF) 1 MG/ML IJ SOLN: 1 mg/mL | INTRAMUSCULAR | Qty: 1

## 2014-10-16 MED FILL — ONDANSETRON (PF) 4 MG/2 ML INJECTION: 4 mg/2 mL | INTRAMUSCULAR | Qty: 2

## 2014-10-16 MED FILL — SODIUM CHLORIDE 0.9 % IV: INTRAVENOUS | Qty: 1000

## 2014-10-16 NOTE — ED Provider Notes (Signed)
HPI   64 yo M with history of Hep C cirrhosis, alcohol abuse, and umbilical hernia presenting with abdominal pain.  Patient reports that his pain has been worsening over the past week.  He reports associated intermittent nausea and vomiting alsong with 8-10/10 abdominal pain at the site of his umbilical hernia.  He reports that he is usually able to reduce the hernia on his own without difficulty, which relieves his symptoms.     Of note, he has been compliant with lactulose and rifaximin.  He is having multiple bowel movements daily.  Patient states that he has been evaluated in the past for hernia repair, but was not a candidate.     Past Medical History:   Diagnosis Date   ??? Alcohol abuse    ??? Anemia    ??? GI bleed    ??? Hepatitis C virus    ??? Hernia    ??? Hypertension    ??? Ill-defined condition      anemia   ??? Varices        Past Surgical History:   Procedure Laterality Date   ??? Upper gi endoscopy,ligat varix  01/20/2013               Family History:   Problem Relation Age of Onset   ??? Heart Disease Father 36     MI   ??? Heart Disease Brother        Social History     Social History   ??? Marital status: SINGLE     Spouse name: N/A   ??? Number of children: N/A   ??? Years of education: N/A     Occupational History   ??? Not on file.     Social History Main Topics   ??? Smoking status: Former Smoker   ??? Smokeless tobacco: Never Used   ??? Alcohol use No      Comment: quit drinking December 2014   ??? Drug use: No   ??? Sexual activity: No     Other Topics Concern   ??? Not on file     Social History Narrative         ALLERGIES: Review of patient's allergies indicates no known allergies.    Review of Systems   Constitutional: Negative for chills and fever.   HENT: Negative for congestion, ear pain, rhinorrhea and sore throat.    Eyes: Negative for pain and visual disturbance.   Respiratory: Negative for shortness of breath and wheezing.    Gastrointestinal: Positive for abdominal pain, diarrhea, nausea and vomiting.    Genitourinary: Negative for dysuria and hematuria.   Musculoskeletal: Negative for arthralgias and myalgias.   Neurological: Negative for dizziness, light-headedness and headaches.   Psychiatric/Behavioral: Negative for confusion and hallucinations.       Vitals:    10/16/14 1301   BP: (!) 144/92   Pulse: 91   Resp: 16   Temp: 98.2 ??F (36.8 ??C)   SpO2: 99%   Weight: 104.3 kg (230 lb)   Height: 5\' 10"  (1.778 m)            Physical Exam   Constitutional: He is oriented to person, place, and time. He appears well-developed and well-nourished. No distress.   HENT:   Head: Normocephalic and atraumatic.   Right Ear: External ear normal.   Left Ear: External ear normal.   Nose: Nose normal.   Mouth/Throat: Oropharynx is clear and moist.   Eyes: Conjunctivae and EOM are normal. Pupils  are equal, round, and reactive to light. Right eye exhibits no discharge. Left eye exhibits no discharge. No scleral icterus.   Neck: Normal range of motion. Neck supple. No tracheal deviation present.   Cardiovascular: Normal rate, regular rhythm, normal heart sounds and intact distal pulses.  Exam reveals no gallop and no friction rub.    No murmur heard.  Pulmonary/Chest: Effort normal and breath sounds normal. No respiratory distress. He has no wheezes. He has no rales.   Abdominal: Soft. Normal appearance. There is no tenderness. There is no rebound and no guarding. A hernia is present. Hernia confirmed positive in the ventral area.       Mild distension, easily reducible 5 cm umbilical hernia, moderate tenderness to palpation at site of hernia   Musculoskeletal: Normal range of motion. He exhibits no edema, tenderness or deformity.   Neurological: He is alert and oriented to person, place, and time. No cranial nerve deficit.   Skin: Skin is warm and dry. No rash noted. He is not diaphoretic. No erythema. No pallor.   Psychiatric: He has a normal mood and affect. His behavior is normal.   Vitals reviewed.       MDM   Number of Diagnoses or Management Options  History of cirrhosis of liver:   Non-intractable cyclical vomiting with nausea:   Umbilical hernia without obstruction or gangrene:   Diagnosis management comments: Differential diagnosis includes incarcerated umbilical hernia, small bowel obstruction, viral gastroenteritis, cholecystitis    - UA: negative  - LFTs mildly elevated, but stable  - Abd XR: negative    Diagnosis: Nausea/vomiting, undetermined etiology  - Possibly secondary to cirrhosis vs. Intermittent incarceration of umbilical hernia  - Low likelihood of cholecystitis as patient is not complaining of RUQ abd pain.  Recent CT abd/pelvis (09/15/14) shows no cholelithiasis or pericholecystic fluid.   - Umbilical hernia not currently incarcerated. Abd XR shows no evidence of obstruction.   - Follow up with PCP in 2-3 days with LFTs. Close follow up with Surgery to discuss possible elective hernia repair.   - Strict instructions to return to the ED if continuous nausea/vomiting and inability to tolerate oral medications or uncontrollable pain       Amount and/or Complexity of Data Reviewed  Clinical lab tests: ordered and reviewed  Tests in the radiology section of CPT??: ordered and reviewed    Risk of Complications, Morbidity, and/or Mortality  Presenting problems: moderate      ED Course       Procedures

## 2014-10-16 NOTE — ED Triage Notes (Signed)
States that he has a history of hernia and is c/o of nausea and vomiting.

## 2014-10-16 NOTE — ED Notes (Signed)
I have reviewed discharge instructions with the patient.  The patient verbalized understanding. Pt ambulated out of ED in stable condition with no complaints voiced and no distress noted

## 2014-10-16 NOTE — ED Notes (Signed)
Pt up ambulating to bathroom to provide urine sample

## 2014-11-06 ENCOUNTER — Ambulatory Visit: Payer: Self-pay | Primary: Family Medicine

## 2015-08-16 ENCOUNTER — Inpatient Hospital Stay
Admit: 2015-08-16 | Discharge: 2015-08-20 | Disposition: A | Discharge status: Home / Self Care | Payer: BLUE CROSS/BLUE SHIELD | Attending: Internal Medicine | Admitting: Internal Medicine

## 2015-08-16 ENCOUNTER — Emergency Department: Admit: 2015-08-16 | Payer: BLUE CROSS/BLUE SHIELD | Primary: Family Medicine

## 2015-08-16 DIAGNOSIS — R1084 Generalized abdominal pain: Secondary | ICD-10-CM

## 2015-08-16 LAB — CELL COUNT, BODY FLUID
FLD LYMPHS: 36 %
FLD MONO/MACROPHAGES: 54 %
FLD NEUTROPHILS: 7 %
FLD OTHER CELLS: 1 %
FLUID MESOTHELIAL: 2 %
FLUID RBC CT.: >100 /mm3
FLUID WBC COUNT: 145 /mm3 — ABNORMAL HIGH (ref 0–5)

## 2015-08-16 LAB — CBC WITH AUTOMATED DIFF
ABS. BASOPHILS: 0 10*3/uL (ref 0.0–0.1)
ABS. EOSINOPHILS: 0.4 10*3/uL (ref 0.0–0.4)
ABS. LYMPHOCYTES: 0.5 10*3/uL — ABNORMAL LOW (ref 0.8–3.5)
ABS. MONOCYTES: 0.4 10*3/uL (ref 0.0–1.0)
ABS. NEUTROPHILS: 2.1 10*3/uL (ref 1.8–8.0)
BASOPHILS: 1 % (ref 0–1)
EOSINOPHILS: 12 % — ABNORMAL HIGH (ref 0–7)
HCT: 28.3 % — ABNORMAL LOW (ref 36.6–50.3)
HGB: 8.9 g/dL — ABNORMAL LOW (ref 12.1–17.0)
LYMPHOCYTES: 14 % (ref 12–49)
MCH: 28.4 PG (ref 26.0–34.0)
MCHC: 31.4 g/dL (ref 30.0–36.5)
MCV: 90.4 FL (ref 80.0–99.0)
MONOCYTES: 13 % (ref 5–13)
NEUTROPHILS: 60 % (ref 32–75)
PLATELET: 53 10*3/uL — ABNORMAL LOW (ref 150–400)
RBC: 3.13 M/uL — ABNORMAL LOW (ref 4.10–5.70)
RDW: 18.2 % — ABNORMAL HIGH (ref 11.5–14.5)
WBC: 3.4 10*3/uL — ABNORMAL LOW (ref 4.1–11.1)

## 2015-08-16 LAB — PTT: aPTT: 28.3 s (ref 22.1–32.5)

## 2015-08-16 LAB — METABOLIC PANEL, COMPREHENSIVE
A-G Ratio: 0.7 — ABNORMAL LOW (ref 1.1–2.2)
ALT (SGPT): 121 U/L — ABNORMAL HIGH (ref 12–78)
AST (SGOT): 155 U/L — ABNORMAL HIGH (ref 15–37)
Albumin: 2.6 g/dL — ABNORMAL LOW (ref 3.5–5.0)
Alk. phosphatase: 115 U/L (ref 45–117)
Anion gap: 2 mmol/L — ABNORMAL LOW (ref 5–15)
BUN/Creatinine ratio: 16 (ref 12–20)
BUN: 11 MG/DL (ref 6–20)
Bilirubin, total: 1 MG/DL (ref 0.2–1.0)
CO2: 31 mmol/L (ref 21–32)
Calcium: 6.9 MG/DL — ABNORMAL LOW (ref 8.5–10.1)
Chloride: 108 mmol/L (ref 97–108)
Creatinine: 0.69 MG/DL — ABNORMAL LOW (ref 0.70–1.30)
GFR est AA: >60 mL/min/{1.73_m2} (ref >60)
GFR est non-AA: >60 mL/min/{1.73_m2} (ref >60)
Globulin: 3.6 g/dL (ref 2.0–4.0)
Glucose: 154 mg/dL — ABNORMAL HIGH (ref 65–100)
Potassium: 4.1 mmol/L (ref 3.5–5.1)
Protein, total: 6.2 g/dL — ABNORMAL LOW (ref 6.4–8.2)
Sodium: 141 mmol/L (ref 136–145)

## 2015-08-16 LAB — AMMONIA: Ammonia, plasma: 75 umol/L — ABNORMAL HIGH (ref <32)

## 2015-08-16 LAB — LDH, BODY FLUID: LD, body fld.: 49 U/L

## 2015-08-16 LAB — GLUCOSE, FLUID: Glucose, body fld.: 123 MG/DL

## 2015-08-16 LAB — BILIRUBIN, FLUID: Bilirubin,Body Fluid: 0.1 MG/DL

## 2015-08-16 LAB — PROTHROMBIN TIME + INR
INR: 1.3 — ABNORMAL HIGH (ref 0.9–1.1)
Prothrombin time: 13 s — ABNORMAL HIGH (ref 9.0–11.1)

## 2015-08-16 LAB — PROTEIN TOTAL, FLUID: Protein total, body fld.: 0.6 g/dL

## 2015-08-16 LAB — ALBUMIN, FLUID: Albumin, body fld.: 0.4 g/dL

## 2015-08-16 LAB — OCCULT BLOOD, STOOL: Occult blood, stool: POSITIVE — AB

## 2015-08-16 LAB — LIPASE: Lipase: 222 U/L (ref 73–393)

## 2015-08-16 MED ORDER — PROCHLORPERAZINE EDISYLATE 5 MG/ML INJECTION
5 mg/mL | Freq: Three times a day (TID) | INTRAMUSCULAR | Status: DC | PRN
Start: 2015-08-16 — End: 2015-08-20
  Administered 2015-08-17: 10:00:00 via INTRAVENOUS

## 2015-08-16 MED ORDER — ONDANSETRON (PF) 4 MG/2 ML INJECTION
4 mg/2 mL | INTRAMUSCULAR | Status: AC
Start: 2015-08-16 — End: 2015-08-16
  Administered 2015-08-16: 17:00:00 via INTRAVENOUS

## 2015-08-16 MED ORDER — FUROSEMIDE 40 MG TAB
40 mg | Freq: Every day | ORAL | Status: DC
Start: 2015-08-16 — End: 2015-08-20
  Administered 2015-08-17 – 2015-08-20 (×4): via ORAL

## 2015-08-16 MED ORDER — SPIRONOLACTONE 25 MG TAB
25 mg | Freq: Every day | ORAL | Status: DC
Start: 2015-08-16 — End: 2015-08-20
  Administered 2015-08-17 – 2015-08-20 (×4): via ORAL

## 2015-08-16 MED ORDER — LIDOCAINE-EPINEPHRINE 1 %-1:100,000 IJ SOLN
1 %-:00,000 | INTRAMUSCULAR | Status: DC | PRN
Start: 2015-08-16 — End: 2015-08-16

## 2015-08-16 MED ORDER — LACTULOSE 10 GRAM/15 ML ORAL SOLUTION
10 gram/15 mL | Freq: Two times a day (BID) | ORAL | Status: DC
Start: 2015-08-16 — End: 2015-08-20
  Administered 2015-08-16 – 2015-08-20 (×7): via ORAL

## 2015-08-16 MED ORDER — DIPHENHYDRAMINE HCL 50 MG/ML IJ SOLN
50 mg/mL | INTRAMUSCULAR | Status: DC | PRN
Start: 2015-08-16 — End: 2015-08-20

## 2015-08-16 MED ORDER — SODIUM CHLORIDE 0.9 % IJ SYRG
Freq: Three times a day (TID) | INTRAMUSCULAR | Status: DC
Start: 2015-08-16 — End: 2015-08-20
  Administered 2015-08-17 – 2015-08-20 (×11): via INTRAVENOUS

## 2015-08-16 MED ORDER — MORPHINE 4 MG/ML SYRINGE
4 mg/mL | Freq: Once | INTRAMUSCULAR | Status: AC
Start: 2015-08-16 — End: 2015-08-16
  Administered 2015-08-16: 17:00:00 via INTRAVENOUS

## 2015-08-16 MED ORDER — SODIUM CHLORIDE 0.9 % IV PIGGY BACK
1 gram | INTRAVENOUS | Status: AC
Start: 2015-08-16 — End: 2015-08-16
  Administered 2015-08-16: 21:00:00 via INTRAVENOUS

## 2015-08-16 MED ORDER — MORPHINE 4 MG/ML SYRINGE
4 mg/mL | INTRAMUSCULAR | Status: AC
Start: 2015-08-16 — End: 2015-08-16
  Administered 2015-08-16: 20:00:00 via INTRAVENOUS

## 2015-08-16 MED ORDER — SODIUM CHLORIDE 0.9 % INJECTION
40 mg | Freq: Two times a day (BID) | INTRAMUSCULAR | Status: DC
Start: 2015-08-16 — End: 2015-08-20
  Administered 2015-08-17 – 2015-08-20 (×7): via INTRAVENOUS

## 2015-08-16 MED ORDER — SODIUM CHLORIDE 0.9 % IJ SYRG
INTRAMUSCULAR | Status: DC | PRN
Start: 2015-08-16 — End: 2015-08-16

## 2015-08-16 MED ORDER — SODIUM CHLORIDE 0.9 % IJ SYRG
INTRAMUSCULAR | Status: DC | PRN
Start: 2015-08-16 — End: 2015-08-20
  Administered 2015-08-18 – 2015-08-20 (×3): via INTRAVENOUS

## 2015-08-16 MED ORDER — MORPHINE 2 MG/ML INJECTION
2 mg/mL | INTRAMUSCULAR | Status: DC | PRN
Start: 2015-08-16 — End: 2015-08-17
  Administered 2015-08-16 – 2015-08-17 (×4): via INTRAVENOUS

## 2015-08-16 MED ORDER — NALOXONE 0.4 MG/ML INJECTION
0.4 mg/mL | INTRAMUSCULAR | Status: DC | PRN
Start: 2015-08-16 — End: 2015-08-20

## 2015-08-16 MED ORDER — ADV ADDAPTOR
1 gram | Status: DC
Start: 2015-08-16 — End: 2015-08-20
  Administered 2015-08-17 – 2015-08-19 (×3): via INTRAVENOUS

## 2015-08-16 MED FILL — BD POSIFLUSH NORMAL SALINE 0.9 % INJECTION SYRINGE: INTRAMUSCULAR | Qty: 10

## 2015-08-16 MED FILL — CEFTRIAXONE 1 GRAM SOLUTION FOR INJECTION: 1 gram | INTRAMUSCULAR | Qty: 1

## 2015-08-16 MED FILL — MORPHINE 4 MG/ML SYRINGE: 4 mg/mL | INTRAMUSCULAR | Qty: 1

## 2015-08-16 MED FILL — MORPHINE 2 MG/ML INJECTION: 2 mg/mL | INTRAMUSCULAR | Qty: 1

## 2015-08-16 MED FILL — ONDANSETRON (PF) 4 MG/2 ML INJECTION: 4 mg/2 mL | INTRAMUSCULAR | Qty: 2

## 2015-08-16 MED FILL — LACTULOSE 20 GRAM/30 ML ORAL SOLUTION: 20 gram/30 mL | ORAL | Qty: 30

## 2015-08-16 NOTE — ED Triage Notes (Signed)
Has been feeling short of breath for a month, fiance states he has been short of breath longer than that. Went to [facility] recently and platelets were low, recommended he follow up with an oncologist and has not been able to do that due to work commitment. Also reports his abdomen has been bloated.

## 2015-08-16 NOTE — Progress Notes (Signed)
TRANSFER - IN REPORT:    Verbal report received from Robin, RN(name) on Evalyn CascoRandolph C Deshler  being received from ED(unit) for routine progression of care      Report consisted of patient???s Situation, Background, Assessment and   Recommendations(SBAR).     Information from the following report(s) SBAR, Kardex and ED Summary was reviewed with the receiving nurse.    Opportunity for questions and clarification was provided.      Assessment completed upon patient???s arrival to unit and care assumed.

## 2015-08-16 NOTE — H&P (Addendum)
[facility]  [address], [place], [place]  [id number]  [phone]    Admission History and Physical      NAME:              Todd CascoRandolph C Wallace   DOB:   12/20/1950   MRN:  [id number]     PCP:  Sol BlazingPhys Other, MD     Date:     08/16/2015     Chief  Complaint: Abdominal pain with SOB    History Of Presenting Illness:       [name] is a 65 y.o. male who is being admitted for worsening abdominal pain, acute, generalized. [name] presented to our Emergency Department today complaining of a persistent, generalized, aching abdominal discomfort. He denies any nausea or vomiting but stools for hemoccult done in the ED were positive. He has a hx of cirrhosis and he was found to have ascites which was drained in the ED. No other associated factors. A chest Xray was unremarkable. He will be admitted for further management.     No Known Allergies    Prior to Admission medications    Medication Sig Start Date End Date Taking? Authorizing Provider   furosemide (LASIX) 40 mg tablet Take 40 mg by mouth daily.   Yes Historical Provider   spironolactone (ALDACTONE) 50 mg tablet Take 50 mg by mouth daily.   Yes Historical Provider   cyanocobalamin 1,000 mcg tablet Take 1,000 mcg by mouth daily.   Yes Historical Provider   bismuth subsalicylate (PEPTO-BISMOL) 262 mg/15 mL suspension Take 30 mL by mouth daily as needed for Indigestion.   Yes Historical Provider   ibuprofen (MOTRIN) 200 mg tablet Take 200 mg by mouth daily as needed for Pain.   Yes Historical Provider   ferrous sulfate 325 mg (65 mg iron) tablet Take 1 Tab by mouth Daily (before breakfast). 09/18/14  Yes Chauncey ReadingMichael S Cho, MD   multivitamin capsule Take 1 Cap by mouth daily. 09/18/14  Yes Chauncey ReadingMichael S Cho, MD       Past Medical History:   Diagnosis Date   ??? Alcohol abuse    ??? Anemia    ??? GI bleed    ??? Hepatitis C virus     ??? Hernia    ??? Hypertension    ??? Ill-defined condition     anemia   ??? Varices         Past Surgical History:   Procedure Laterality Date   ??? UPPER GI ENDOSCOPY,LIGAT VARIX  01/20/2013            Social History   Substance Use Topics   ??? Smoking status: Former Smoker   ??? Smokeless tobacco: Never Used   ??? Alcohol use No      Comment: quit drinking December 2014        Family History   Problem Relation Age of Onset   ??? Heart Disease Father 5751     MI   ??? Heart Disease Brother       Review of Systems:    Constitutional ROS: no fever, chills, rigors or night sweats  Respiratory ROS: no cough, sputum, hemoptysis, dyspnea or pleuritic pain.  Cardiovascular ROS: no chest pain, palpitations, orthopnea, PND or syncope  Endocrine ROS: no polydispsia, polyuria, heat or cold intolerance or major weight change.  Gastrointestinal ROS: no dysphagia, nausea, vomiting or diarrhea    Genito-Urinary ROS: no dysuria, frequency, hematuria, retention or flank pain  Musculoskeletal ROS: no joint pain, swelling or muscular tenderness  Neurological ROS: no headache, focal weakness or motor symptoms but slow in answering questions   Psychiatric ROS: no depression, anxiety, mood swings  Dermatological ROS: no rash, pruritis, or urticaria  Heme-Lymph ROS: no swollen glands, bleeding    Examination:    Constitutional:    Visit Vitals   ??? BP 147/75   ??? Pulse 81   ??? Temp 97.4 ??F (36.3 ??C)   ??? Resp 18   ??? Ht  (1.753 m)   ??? Wt 115 kg (253 lb 8.5 oz)   ??? SpO2 99%   ??? BMI 37.44 kg/m2       General:  Weak and ill looking patient in no acute distress  Eyes: Pink conjunctivae, PERRLA with no discharge. Normal eye movements  Ear, Nose, Mouth & Throat: No ottorrhea, rhinorrhea, non tender sinuses, dry mucous membranes  Respiratory:  No accessory muscle use, clear breath sounds without crackles or wheezes  Cardiovascular:  No JVD or murmurs, regular and normal S1, S2 without thrills, bruits. + peripheral edema.    GI & GU:  Firm abdomen, generally tender, distended, normoactive bowel sounds with no palpable organomegaly  Heme:  No cervical or axillary adenopathy.   Musculoskeletal:  No cyanosis, clubbing, atrophy or deformities  Skin:  No rashes, bruising or ulcers   Neurological: Awake and alert, slow in answering questions, but otherwise non focal.   Psychiatric:  Has a fair insight to illness   ________________________________________________________________________    Data Review:    Labs:    Recent Labs      08/16/15   1245   WBC  3.4*   HGB  8.9*   HCT  28.3*   PLT  53*     Recent Labs      08/16/15   1245   NA  141   K  4.1   CL  108   CO2  31   GLU  154*   BUN  11   CREA  0.69*   CA  6.9*   ALB  2.6*   SGOT  155*   ALT  121*     No components found for: GLPOC  No results for input(s): PH, PCO2, PO2, HCO3, FIO2 in the last 72 hours.  Recent Labs      08/16/15   1720   INR  1.3*       Radiological Studies:      Personally reviewed Chest X-ray - unremarkable.     Other Medical tests:    Personally reviewed 12 lead EKG - sinus with low voltages     I have also reviewed available old medical records.     Assessment & Impression:     [name] is a 65 y.o. 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 Admit to hospital. Follow  ascitic fluids already drawn. Start empiric IV ceftriaxone. 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 Monitor Hgb. Consult GI. IV PPIs     Pancytopenia (HCC) (11/08/2012): I suspect this maybe related to liver cirrhosis. He was to see hematology as an outpatient but has not managed to. Consult hematology while here    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): presumed alcohol related but may also be from hepatis C. Ascites fluid tapped today. GI to review    Hepatic encephalopathy (HCC) (09/16/2014)/  Hyperammonemia (HCC) (09/15/2014): speech seems slow. Ammonia level of 75. Start lactulose. Monitor clinically    SOB (shortness of breath) (08/16/2015): unremarkable chest Xray. EKG non ischemic but has low voltage. Check Echocardiogram     Hepatitis C infection (12/23/2011): may need follow up with hepatology. GI to see    Code Status:  Full    Surrogate decision maker: Family    Risk of deterioration: high      Total time spent for the care of the patient: 4970 Minutes                  Care Plan discussed with: Patient, Family, Nursing Staff and ED physician    Discussed:  Code Status, Care Plan and D/C Planning    Prophylaxis:  SCD's and H2B/PPI    Probable Disposition:  Home w/Family           ___________________________________________________    Attending Physician: Melynda Kellerobert O Kaziyah Parkison, MD

## 2015-08-16 NOTE — ED Provider Notes (Signed)
HPI Comments: 65 y.o. male with past medical history significant for HTN, anemia, esophageal varices, hepatitis C, GI bleed, hernia, and ETOH abuse who presents with chief complaint of abdominal pain and SOB. Pt states that over the past month he has experienced progressively worsening SOB. He was seen for this issue at [facility] 2-3 weeks ago and was dx with bronchitis. At that time, pt was also found to have "low platelet counts," so the he was referred to oncology at [facility]. However, he could not be seen until next month. Pt states that since being seen at [facility], his SOB has become worse. Additionally, pt complains of diffuse abd pain and abd distention. He has a h/o liver cirrhosis and notes that he has needed a paracentesis in the past. Pt also complains of diarrhea, dark bloody stool, and a cough that has persisted for the past several weeks. Pt is regularly on Lasix and spironolactone. Pt is unsure of fever. There are no other acute medical concerns at this time.    Social hx: + ETOH; works as a Pharmacist, community  PCP: Phys Other, MD    Note written by Cordelia Pen. Stone, Education administrator, as dictated by Kathlene November. Cheyne Bungert, MD 1:00 PM      The history is provided by the patient and a significant other. No language interpreter was used.        Past Medical History:   Diagnosis Date   ??? Alcohol abuse    ??? Anemia    ??? GI bleed    ??? Hepatitis C virus    ??? Hernia    ??? Hypertension    ??? Ill-defined condition     anemia   ??? Varices        Past Surgical History:   Procedure Laterality Date   ??? UPPER GI ENDOSCOPY,LIGAT VARIX  01/20/2013              Family History:   Problem Relation Age of Onset   ??? Heart Disease Father 97     MI   ??? Heart Disease Brother        Social History     Social History   ??? Marital status: SINGLE     Spouse name: N/A   ??? Number of children: N/A   ??? Years of education: N/A     Occupational History   ??? Not on file.     Social History Main Topics   ??? Smoking status: Former Smoker    ??? Smokeless tobacco: Never Used   ??? Alcohol use No      Comment: quit drinking December 2014   ??? Drug use: No   ??? Sexual activity: No     Other Topics Concern   ??? Not on file     Social History Narrative         ALLERGIES: Review of patient's allergies indicates no known allergies.    Review of Systems   Constitutional: Negative for chills and fever.   HENT: Negative for ear pain and sore throat.    Eyes: Negative for pain.   Respiratory: Positive for cough and shortness of breath. Negative for chest tightness.    Cardiovascular: Negative for chest pain and leg swelling.   Gastrointestinal: Positive for abdominal distention, abdominal pain, blood in stool and diarrhea. Negative for nausea and vomiting.   Genitourinary: Negative for dysuria and flank pain.   Musculoskeletal: Negative for back pain.   Skin: Positive for pallor. Negative for rash.  Neurological: Negative for headaches.   All other systems reviewed and are negative.      Vitals:    08/16/15 1224   BP: 126/75   Pulse: 89   Resp: 20   Temp: 97.4 ??F (36.3 ??C)   SpO2: 95%   Weight: 115 kg (253 lb 8.5 oz)   Height: 5' 9"  (1.753 m)            Physical Exam   Constitutional: He appears well-developed and well-nourished. No distress.   HENT:   Head: Normocephalic and atraumatic.   Eyes: Pupils are equal, round, and reactive to light. No scleral icterus.   Neck: Normal range of motion. Neck supple. No tracheal deviation present.   Cardiovascular: Normal rate, regular rhythm, normal heart sounds and intact distal pulses.  Exam reveals no gallop and no friction rub.    No murmur heard.  Pulmonary/Chest: Effort normal and breath sounds normal. No respiratory distress. He has no wheezes. He has no rales.   Abdominal: Soft. He exhibits distension. There is no tenderness. There is no rebound and no guarding.   There is ascites. Pt exhibits a large ventral hernia.   Musculoskeletal: He exhibits edema.   2+ pitting edema in the BLE   Neurological: He is alert.    Skin: Skin is warm and dry.   + jaundice   Psychiatric: He has a normal mood and affect.   Nursing note and vitals reviewed.  Note written by Cordelia Pen. Stone, Education administrator, as dictated by Kathlene November. Yarnell Kozloski, MD 1:00 PM         MDM  Number of Diagnoses or Management Options  Ascites due to alcoholic cirrhosis [facility]):   Rectal bleeding:   Diagnosis management comments: 65 year old male who presents with ascites and shortness of breath. Differential diagnosis includes SBP, ascites, pneumonia, anemia.  Hemoglobin returned at 8.9. Stool was positive for occult blood. Paracentesis was performed and fluid sent to the lab.    ED Course       Paracentesis  Date/Time: 08/16/2015 5:03 PM  Performed by: Valeda Malm.  Authorized by: Valeda Malm     Consent:     Consent obtained:  Verbal and written    Consent given by:  Patient    Risks discussed:  Bleeding, infection, incomplete drainage and pain (bowel damage)  Indications:     Indications:  Rule out SBP, improve SOB  Pre-procedure details:     Skin preparation:  ChloraPrep    Preparation: Patient was prepped and draped in the usual sterile fashion    Anesthesia (see MAR for exact dosages):     Anesthesia method:  Local infiltration    Local anesthetic:  Lidocaine 1% w/o epi  Post-procedure details:     Patient tolerance of procedure:  Tolerated well, no immediate complications  Comments:      Safety-Centesis kit uses to place pigtail catheter and drainage of fluid.        ED EKG interpretation:  Rhythm: sinus rhythm with PACs; and regular . Rate (approx.): 85; ST/T wave: normal; low voltage QRS.  Note written by Cordelia Pen. Stone, Education administrator, as dictated by Kathlene November. Kyllie Pettijohn, MD 12:35 AM       5:01 PM  Patient is being admitted to the hospital.  The results of their tests and reasons for their admission have been discussed with them and/or available family.  They convey agreement and understanding for the need to be  admitted and for their admission diagnosis.  Consultation has been made with the inpatient physician specialist for hospitalization.    LABORATORY TESTS:  Recent Results (from the past 12 hour(s))   EKG, 12 LEAD, INITIAL    Collection Time: 08/16/15 12:35 PM   Result Value Ref Range    Ventricular Rate 85 BPM    Atrial Rate 85 BPM    P-R Interval 98 ms    QRS Duration 78 ms    Q-T Interval 396 ms    QTC Calculation (Bezet) 471 ms    Calculated P Axis 15 degrees    Calculated R Axis 28 degrees    Calculated T Axis 54 degrees    Diagnosis       Sinus rhythm with short PR with premature atrial complexes  Low voltage QRS  Borderline ECG  No previous ECGs available     CBC WITH AUTOMATED DIFF    Collection Time: 08/16/15 12:45 PM   Result Value Ref Range    WBC 3.4 (L) 4.1 - 11.1 K/uL    RBC 3.13 (L) 4.10 - 5.70 M/uL    HGB 8.9 (L) 12.1 - 17.0 g/dL    HCT 28.3 (L) 36.6 - 50.3 %    MCV 90.4 80.0 - 99.0 FL    MCH 28.4 26.0 - 34.0 PG    MCHC 31.4 30.0 - 36.5 g/dL    RDW 18.2 (H) 11.5 - 14.5 %    PLATELET 53 (L) 150 - 400 K/uL    NEUTROPHILS 60 32 - 75 %    LYMPHOCYTES 14 12 - 49 %    MONOCYTES 13 5 - 13 %    EOSINOPHILS 12 (H) 0 - 7 %    BASOPHILS 1 0 - 1 %    ABS. NEUTROPHILS 2.1 1.8 - 8.0 K/UL    ABS. LYMPHOCYTES 0.5 (L) 0.8 - 3.5 K/UL    ABS. MONOCYTES 0.4 0.0 - 1.0 K/UL    ABS. EOSINOPHILS 0.4 0.0 - 0.4 K/UL    ABS. BASOPHILS 0.0 0.0 - 0.1 K/UL    DF SMEAR SCANNED      RBC COMMENTS ANISOCYTOSIS  1+        RBC COMMENTS POLYCHROMASIA  PRESENT       LIPASE    Collection Time: 08/16/15 12:45 PM   Result Value Ref Range    Lipase 222 73 - 161 U/L   METABOLIC PANEL, COMPREHENSIVE    Collection Time: 08/16/15 12:45 PM   Result Value Ref Range    Sodium 141 136 - 145 mmol/L    Potassium 4.1 3.5 - 5.1 mmol/L    Chloride 108 97 - 108 mmol/L    CO2 31 21 - 32 mmol/L    Anion gap 2 (L) 5 - 15 mmol/L    Glucose 154 (H) 65 - 100 mg/dL    BUN 11 6 - 20 MG/DL    Creatinine 0.69 (L) 0.70 - 1.30 MG/DL     BUN/Creatinine ratio 16 12 - 20      GFR est AA >60 >60 ml/min/1.70m    GFR est non-AA >60 >60 ml/min/1.730m   Calcium 6.9 (L) 8.5 - 10.1 MG/DL    Bilirubin, total 1.0 0.2 - 1.0 MG/DL    ALT (SGPT) 121 (H) 12 - 78 U/L    AST (SGOT) 155 (H) 15 - 37 U/L    Alk. phosphatase 115 45 - 117 U/L    Protein, total 6.2 (L) 6.4 - 8.2 g/dL    Albumin 2.6 (L)  3.5 - 5.0 g/dL    Globulin 3.6 2.0 - 4.0 g/dL    A-G Ratio 0.7 (L) 1.1 - 2.2     AMMONIA    Collection Time: 08/16/15  1:21 PM   Result Value Ref Range    Ammonia 75 (H) <32 UMOL/L   OCCULT BLOOD, STOOL    Collection Time: 08/16/15  3:38 PM   Result Value Ref Range    Occult blood, stool POSITIVE (A) NEG         IMAGING RESULTS:  XR CHEST PA LAT   Final Result        Xr Chest Pa Lat    Result Date: 08/16/2015  Exam:  2 view chest Indication: Shortness of breath PA and lateral views demonstrate normal heart size. There is no acute process in the lung fields. The osseous structures are unremarkable.     Impression: No acute process.       MEDICATIONS GIVEN:  Medications   sodium chloride (NS) flush 5-10 mL (not administered)   cefTRIAXone (ROCEPHIN) 1 g in 0.9% sodium chloride (MBP/ADV) 50 mL (not administered)   ondansetron (ZOFRAN) injection 4 mg (4 mg IntraVENous Given 08/16/15 1312)   morphine injection 4 mg (4 mg IntraVENous Given 08/16/15 1312)   morphine injection 4 mg (4 mg IntraVENous Given 08/16/15 1543)       IMPRESSION:  1. Ascites due to alcoholic cirrhosis ([place])    2. Rectal bleeding        PLAN:  1. Admit to hospitalist    Total critical care time spent exclusive of procedures:  32 minutes      Kwan Shellhammer J. Tillie Rung, MD

## 2015-08-16 NOTE — Progress Notes (Signed)
[facility]: MED RECONCILIATION    Comments/Recommendations:     Currently drinking less over the last few weeks but cycles on and off of heavier drinking    Generally non-adherent to most meds and supplements    Pt taking Furosemide + Spironolactone with some regularity  - could use dose adjustment and increase Spironolactone to 100mg  (keep the 100:40 ratio of spironolactone to furosemide)    Ammonia = 72  - slightly elevated  - d/w pt the purpose of lactulose and rifaxamin  - pt reports some dizziness but no AMS as of late    Medications added:   ?? Vit B12  ?? Pepto-bismol    Medications removed:  ?? Folic acid 1mg  daily  ?? Lactulose 10 gm TID  ?? Rifaximin 550mg  daily (should be BID)  ?? Thiamine 100mg  daily    Information obtained from: Patient, friend, medication bottles, RxQuery, previous medical records      Allergies: Review of patient's allergies indicates no known allergies.    Prior to Admission Medications:     Medication Documentation Review Audit       Reviewed by Barbara CowerJason A. Della GooQuarles, PHARMD (Pharmacist) on 08/16/15 at 1455         Medication Sig Documenting Provider Last Dose Status Taking?      bismuth subsalicylate (PEPTO-BISMOL) 262 mg/15 mL suspension Take 30 mL by mouth daily as needed for Indigestion. Historical Provider  Active Yes    cyanocobalamin 1,000 mcg tablet Take 1,000 mcg by mouth daily. Historical Provider 08/09/2015 Active Yes    ferrous sulfate 325 mg (65 mg iron) tablet Take 1 Tab by mouth Daily (before breakfast). Chauncey ReadingMichael S Cho, MD 08/09/2015 Active Yes    furosemide (LASIX) 40 mg tablet Take 40 mg by mouth daily. Historical Provider 08/09/2015 Active Yes    ibuprofen (MOTRIN) 200 mg tablet Take 200 mg by mouth daily as needed for Pain. Historical Provider  Active Yes    multivitamin capsule Take 1 Cap by mouth daily. Chauncey ReadingMichael S Cho, MD 08/09/2015 Active Yes    spironolactone (ALDACTONE) 50 mg tablet Take 50 mg by mouth daily. Historical Provider 08/09/2015 Active Yes              Med Note (Mical Kicklighter A.   Sun Aug 16, 2015  2:54 PM): Consider increasing to 100mg  daily.                  Thank you,  Cheree DittoJason Suesan Mohrmann, Pharm.D.  [phone]

## 2015-08-16 NOTE — Progress Notes (Signed)
6:34 PM Patient states Renata Montefuseco can receive updates on patient's progress.

## 2015-08-16 NOTE — ED Notes (Signed)
Total output from paracentesis, at this time.

## 2015-08-16 NOTE — ED Notes (Signed)
Consent obtained for paracentesis.

## 2015-08-16 NOTE — ED Notes (Signed)
Assumed care of patient.  Acknowledged patient, introduced myself as primary RN.  Process explained to patient, including tests ordered and wait times.  Discussed the patient's expectations for this visit, addressed any concerns and answered questions.  Stretcher in low, locked position and call bell within reach.

## 2015-08-16 NOTE — Progress Notes (Signed)
7:22 PM Bedside and Verbal shift change report given to Aaron, RN (oncoming nurse) by Maureen, RN (offgoing nurse). Report included the following information SBAR and Kardex.

## 2015-08-16 NOTE — Other (Signed)
TRANSFER - OUT REPORT:    Verbal report given to Maureen, RN(name) on Evalyn Cascoandolph C Casasola  being transferred to 5th floor(unit) for routine progression of care       Report consisted of patient???s Situation, Background, Assessment and   Recommendations(SBAR).     Information from the following report(s) SBAR, ED Summary, [facility] and Recent Results was reviewed with the receiving nurse.    Lines:   Peripheral IV 08/16/15 Left Antecubital (Active)   Site Assessment Clean, dry, & intact 08/16/2015 12:40 PM   Phlebitis Assessment 0 08/16/2015 12:40 PM   Infiltration Assessment 0 08/16/2015 12:40 PM   Dressing Type Tape;Transparent 08/16/2015 12:40 PM   Hub Color/Line Status Pink;Flushed;Patent 08/16/2015 12:40 PM   Action Taken Blood drawn 08/16/2015 12:40 PM        Opportunity for questions and clarification was provided.      Patient transported with:   [employer]

## 2015-08-16 NOTE — ED Notes (Signed)
Dr. Omuria at bedside.

## 2015-08-16 NOTE — ED Notes (Signed)
Dr. Antoine Primasitchner at bedside to complete paracentesis.

## 2015-08-16 NOTE — ED Triage Notes (Signed)
Initially denies chest pain now reports chest pain .

## 2015-08-17 ENCOUNTER — Inpatient Hospital Stay: Admit: 2015-08-17 | Payer: BLUE CROSS/BLUE SHIELD | Primary: Family Medicine

## 2015-08-17 ENCOUNTER — Inpatient Hospital Stay

## 2015-08-17 LAB — CBC WITH AUTOMATED DIFF
ABS. BASOPHILS: 0 10*3/uL (ref 0.0–0.1)
ABS. EOSINOPHILS: 0.5 10*3/uL — ABNORMAL HIGH (ref 0.0–0.4)
ABS. LYMPHOCYTES: 0.5 10*3/uL — ABNORMAL LOW (ref 0.8–3.5)
ABS. MONOCYTES: 0.7 10*3/uL (ref 0.0–1.0)
ABS. NEUTROPHILS: 2.2 10*3/uL (ref 1.8–8.0)
BASOPHILS: 1 % (ref 0–1)
EOSINOPHILS: 12 % — ABNORMAL HIGH (ref 0–7)
HCT: 26.7 % — ABNORMAL LOW (ref 36.6–50.3)
HGB: 8.7 g/dL — ABNORMAL LOW (ref 12.1–17.0)
LYMPHOCYTES: 13 % (ref 12–49)
MCH: 28.9 PG (ref 26.0–34.0)
MCHC: 32.6 g/dL (ref 30.0–36.5)
MCV: 88.7 FL (ref 80.0–99.0)
MONOCYTES: 18 % — ABNORMAL HIGH (ref 5–13)
NEUTROPHILS: 56 % (ref 32–75)
PLATELET: 50 10*3/uL — ABNORMAL LOW (ref 150–400)
RBC: 3.01 M/uL — ABNORMAL LOW (ref 4.10–5.70)
RDW: 18.2 % — ABNORMAL HIGH (ref 11.5–14.5)
WBC: 3.8 10*3/uL — ABNORMAL LOW (ref 4.1–11.1)

## 2015-08-17 LAB — URINALYSIS W/MICROSCOPIC
Bacteria: NEGATIVE /hpf
Bilirubin: NEGATIVE
Blood: NEGATIVE
Glucose: NEGATIVE mg/dL
Ketone: NEGATIVE mg/dL
Leukocyte Esterase: NEGATIVE
Nitrites: NEGATIVE
Protein: NEGATIVE mg/dL
Specific gravity: 1.026 (ref 1.003–1.030)
Urobilinogen: 0.2 EU/dL (ref 0.2–1.0)
pH (UA): 6 (ref 5.0–8.0)

## 2015-08-17 LAB — AMMONIA: Ammonia, plasma: 70 umol/L — ABNORMAL HIGH (ref <32)

## 2015-08-17 LAB — ECHOCARDIOGRAM COMPLETE 2D W DOPPLER W COLOR: Left Ventricular Ejection Fraction: 60

## 2015-08-17 MED ORDER — ALBUMIN, HUMAN 5 % IV
5 % | Freq: Once | INTRAVENOUS | Status: AC
Start: 2015-08-17 — End: 2015-08-17
  Administered 2015-08-17: 23:00:00 via INTRAVENOUS

## 2015-08-17 MED ORDER — IOPAMIDOL 76 % IV SOLN
370 mg iodine /mL (76 %) | Freq: Once | INTRAVENOUS | Status: AC
Start: 2015-08-17 — End: 2015-08-17
  Administered 2015-08-17: 14:00:00 via INTRAVENOUS

## 2015-08-17 MED ORDER — OXYCODONE-ACETAMINOPHEN 5 MG-325 MG TAB
5-325 mg | Freq: Once | ORAL | Status: AC
Start: 2015-08-17 — End: 2015-08-16
  Administered 2015-08-17: 02:00:00 via ORAL

## 2015-08-17 MED ORDER — ZOLPIDEM 5 MG TAB
5 mg | Freq: Every evening | ORAL | Status: DC | PRN
Start: 2015-08-17 — End: 2015-08-20
  Administered 2015-08-17 – 2015-08-20 (×4): via ORAL

## 2015-08-17 MED ORDER — PERFLUTREN LIPID MICROSPHERES 1.1 MG/ML IV
1.1 mg/mL | Freq: Once | INTRAVENOUS | Status: AC
Start: 2015-08-17 — End: 2015-08-17
  Administered 2015-08-17: 17:00:00 via INTRAVENOUS

## 2015-08-17 MED ORDER — HYDROMORPHONE (PF) 1 MG/ML IJ SOLN
1 mg/mL | INTRAMUSCULAR | Status: DC | PRN
Start: 2015-08-17 — End: 2015-08-19
  Administered 2015-08-17 – 2015-08-19 (×11): via INTRAVENOUS

## 2015-08-17 MED FILL — SPIRONOLACTONE 25 MG TAB: 25 mg | ORAL | Qty: 2

## 2015-08-17 MED FILL — ISOVUE-370  76 % INTRAVENOUS SOLUTION: 370 mg iodine /mL (76 %) | INTRAVENOUS | Qty: 100

## 2015-08-17 MED FILL — PROTONIX 40 MG INTRAVENOUS SOLUTION: 40 mg | INTRAVENOUS | Qty: 40

## 2015-08-17 MED FILL — MORPHINE 2 MG/ML INJECTION: 2 mg/mL | INTRAMUSCULAR | Qty: 1

## 2015-08-17 MED FILL — OXYCODONE-ACETAMINOPHEN 5 MG-325 MG TAB: 5-325 mg | ORAL | Qty: 1

## 2015-08-17 MED FILL — CEFTRIAXONE 1 GRAM SOLUTION FOR INJECTION: 1 gram | INTRAMUSCULAR | Qty: 1

## 2015-08-17 MED FILL — HYDROMORPHONE (PF) 1 MG/ML IJ SOLN: 1 mg/mL | INTRAMUSCULAR | Qty: 1

## 2015-08-17 MED FILL — ZOLPIDEM 5 MG TAB: 5 mg | ORAL | Qty: 1

## 2015-08-17 MED FILL — ALBUTEIN 5 % INTRAVENOUS SOLUTION: 5 % | INTRAVENOUS | Qty: 500

## 2015-08-17 MED FILL — LACTULOSE 20 GRAM/30 ML ORAL SOLUTION: 20 gram/30 mL | ORAL | Qty: 30

## 2015-08-17 MED FILL — DEFINITY 1.1 MG/ML INTRAVENOUS SUSPENSION: 1.1 mg/mL | INTRAVENOUS | Qty: 2

## 2015-08-17 MED FILL — FUROSEMIDE 40 MG TAB: 40 mg | ORAL | Qty: 1

## 2015-08-17 NOTE — Progress Notes (Signed)
Problem: Mobility Impaired (Adult and Pediatric)  Goal: *Acute Goals and Plan of Care (Insert Text)  PHYSICAL THERAPY EVALUATION/DISCHARGE  Patient: [name] y.o. male)  Date: 08/17/2015  Primary Diagnosis: Abdominal pain, acute, generalized        Precautions: fall 2     ASSESSMENT :  Based on the objective data described below, the patient presents with mild balance impairment and abnormal gait, long standing sensory dysfunction due to prior injuries, apparent CTS/ cramping R hand. Gait presently wide based with increased trunk sway and wide base, most likely due to abdominal discomfort; stable.  Able to perform single leg stance to don shorts. Denies falls 'except when there's a reason' (carrying heavy object down stairs), no need for assistive device. 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  Advised re mgmt of CTS. No discharge needs anticipated.     Skilled physical therapy is not indicated at this time.       PLAN :  Discharge Recommendations: None  Further Equipment Recommendations for Discharge: none anticipated       SUBJECTIVE:   Patient stated ???these cramps kill me.???  Advised re stretches and use of CTS splint      OBJECTIVE DATA SUMMARY:   HISTORY:    Past Medical History:   Diagnosis Date   ??? Alcohol abuse     ??? Anemia     ??? GI bleed     ??? Hepatitis C virus     ??? Hernia     ??? Hypertension     ??? Ill-defined condition       anemia   ??? Obesity (BMI 30-39.9) 08/17/2015   ??? Varices       Past Surgical History:   Procedure Laterality Date   ??? UPPER GI ENDOSCOPY,LIGAT VARIX   01/20/2013           Prior Level of Function/Home Situation: see above  Personal factors and/or comorbidities impacting plan of care: etoh; questionable compliance     Home Situation  Home Environment: Private residence  One/Two Story Residence: Two Government social research officer Chair Available: No  Living Alone: No  Support Systems: Games developer  Patient Expects to be Discharged to:: Private residence   Current DME Used/Available at Home: None  Tub or Shower Type: Tub/Shower combination     EXAMINATION/PRESENTATION/DECISION MAKING:   Critical Behavior:  Neurologic State: Agitated  Orientation Level: Oriented X4  Cognition: Appropriate decision making, Appropriate for age attention/concentration, Appropriate safety awareness     Hearing:  Auditory  Auditory Impairment: None  Skin:  nt  Edema: nt  Range Of Motion:  AROM: Within functional limits                       Strength:    Strength: Within functional limits                    Tone & Sensation:   Tone: Normal              Sensation: Impaired (R hand; L knee)               Coordination:  Coordination: Within functional limits  Vision:      Functional Mobility:  Bed Mobility:  Rolling: Independent  Supine to Sit: Independent  Sit to Supine: Independent  Scooting: Independent  Transfers:  Sit to Stand: Modified independent  Stand to Sit: Independent  Balance:   Sitting: Intact  Standing: Intact  Ambulation/Gait Training:  Distance (ft): 150 Feet (ft)  Assistive Device: Other (comment) (none)  Ambulation - Level of Assistance: Supervision        Gait Abnormalities: Decreased step clearance;Trunk sway increased        Base of Support: Widened     Speed/Cadence: Slow  Step Length: Right shortened;Left shortened                               Therapeutic Exercises:   Wrist, finger stretches  Neural stretches     Functional Measure:  Tinetti test:      Sitting Balance: 1  Arises: 1  Attempts to Rise: 2  Immediate Standing Balance: 2  Standing Balance: 1  Nudged: 2  Eyes Closed: 1  Turn 360 Degrees - Continuous/Discontinuous: 1  Turn 360 Degrees - Steady/Unsteady: 1  Sitting Down: 1  Balance Score: 13  Indication of Gait: 0  R Step Length/Height: 1  L Step Length/Height: 1  R Foot Clearance: 1  L Foot Clearance: 1  Step Symmetry: 1  Step Continuity: 1  Path: 1  Trunk: 1  Walking Time: 0  Gait Score: 8  Total Score: 21          Tinetti Test and G-code impairment scale:  Percentage of Impairment CH     0%    CI     1-19% CJ     20-39% CK     40-59% CL     60-79% CM     80-99% CN      100%   Tinetti  Score 0-28 28 23-27 17-22 12-16 6-11 1-5 0          Tinetti Tool Score Risk of Falls  <19 = High Fall Risk  19-24 = Moderate Fall Risk  25-28 = Low Fall Risk  Tinetti ME. Performance-Oriented Assessment of Mobility Problems in Elderly Patients. JAGS 1986; [id number]. (Scoring Description: PT Bulletin Feb. 10, 1993)     Older adults: Lonn [place](Ko et al, 2009; n = 1000 [place] elderly evaluated with ABC, POMA, ADL, and IADL)  ?? Mean POMA score for males aged 65-79 years = 26.21(3.40)  ?? Mean POMA score for females age 63-79 years = 25.16(4.30)  ?? Mean POMA score for males over 80 years = 23.29(6.02)  ?? Mean POMA score for females over 80 years = 17.20(8.32)         G codes:  In compliance with CMS???s Claims Based Outcome Reporting, the following G-code set was chosen for this patient based on their primary functional limitation being treated:     The outcome measure chosen to determine the severity of the functional limitation was the tinetti with a score of 21/28 which was correlated with the impairment scale.      ?? Mobility - Walking and Moving Around:              [id number] - CURRENT STATUS:    CJ - 20%-39% impaired, limited or restricted              [id number] - GOAL STATUS:           CJ - 20%-39% impaired, limited or restricted              [id number] - D/C STATUS:  CJ - 20%-39% impaired, limited or restricted         Physical Therapy Evaluation Charge Determination   History Examination Presentation Decision-Making   MEDIUM  Complexity : 1-2 comorbidities / personal factors will impact the outcome/ POC  LOW Complexity : 1-2 Standardized tests and measures addressing body structure, function, activity limitation and / or participation in recreation  LOW Complexity : Stable, uncomplicated  Other outcome measures tinetti  LOW        Based on the above components, the patient evaluation is determined to be of the following complexity level: LOW      Pain:  Pain Scale 1: Numeric (0 - 10)  Pain Intensity 1: 9  Pain Location 1: Abdomen  Activity Tolerance:   Mild sob with activity  Please refer to the flowsheet for vital signs taken during this treatment.  After treatment:   [ ]    Patient left in no apparent distress sitting up in chair  [X]    Patient left in no apparent distress in bed  [X]    Call bell left within reach  [X]    Nursing notified  [ ]    Caregiver present  [ ]    Bed alarm activated      COMMUNICATION/EDUCATION:   Communication/Collaboration:  [X]    Fall prevention education was provided and the patient/caregiver indicated understanding.  [X]    Patient/family have participated as able and agree with findings and recommendations.  [ ]    Patient is unable to participate in plan of care at this time.  Findings and recommendations were discussed with: Occupational Therapist, Registered Nurse and Certified Nursing Assistant/Patient Care Technician     Thank you for this referral.  Kathi DerSharon Idalia Allbritton, PT   Time Calculation: 14 mins

## 2015-08-17 NOTE — Progress Notes (Addendum)
Occupational therapy note:    1300:  2nd attempt to see patient for OT evaluation . Patient declined activity at this time and irritable as he has not been given food yet.  Patient requesting follow up at later time.  Jocelyn C. Skipper, MS OTR/L      Orders received, chart reviewed.  Patient cleared by nursing.  Attempted to see patient for OT evaluation.  Patient currently receiving bedside ECHO.  Will follow up with patient as able.  Jocelyn C. Skipper, MS OTR/L

## 2015-08-17 NOTE — Progress Notes (Signed)
Physical Therapy Attempted initial assessment.  Patient irritable; anxious to eat.  Will return at a later time.  [name] [name] PT

## 2015-08-17 NOTE — Consults (Signed)
Nile Dearhristopher D. Nunzio CoryLyons, MD  [phone] office  [phone] voicemail   Gastroenterology Consultation Note      Admit Date: 08/16/2015  Consult Date: 08/17/2015   I greatly appreciate your asking me to see Todd CascoRandolph C Solecki, thank you very much for the opportunity to participate in his care.    Narrative Assessment and Plan   ?? Cirrhosis  ?? HCV - questionable treatment for this, not clear if he completed therapy or responded.  Check HCV PCR for viral load  ?? EtOH abuse - last consumption 2 days prior to admission - unequivocally advised total abstinence from ethanol, he and wife indicate understanding and agree  ?? Ascites- treatment to include paracentesis (drain dry) and afterwards low sodium diet and diuretics.  Nutrition consult for low sodium diet ed.  ?? Varices with history of bleeding - EGD tomorrow NPO after midnight  ?? HCC screening - had CT today    After discharge he will need [facility]iff[facility] to consider anti-HCV therapy and longitudinal therapy for cirrhosis.     Subjective:     Chief Complaint: Cirrhosis    History of Present Illness: Cirrhosis, established and long standing.  EtOH abuse but now he reports low volume intake, wife disputes, and has history of HCV.  He describes some treatment for hepatitis but details are not clear.  [facility].  Now complains of abdominal distension and he is having paracentesis.    PCP:  Phys Other, MD    Past Medical History:   Diagnosis Date   ??? Alcohol abuse    ??? Anemia    ??? GI bleed    ??? Hepatitis C virus    ??? Hernia    ??? Hypertension    ??? Ill-defined condition     anemia   ??? Obesity (BMI 30-39.9) 08/17/2015   ??? Varices         Past Surgical History:   Procedure Laterality Date   ??? UPPER GI ENDOSCOPY,LIGAT VARIX  01/20/2013            Social History   Substance Use Topics   ??? Smoking status: Former Smoker   ??? Smokeless tobacco: Never Used   ??? Alcohol use No       Comment: quit drinking December 2014        Family History   Problem Relation Age of Onset   ??? Heart Disease Father [id number]     MI   ??? Heart Disease Brother         No Known Allergies         Home Medications:  Prior to Admission Medications   Prescriptions Last Dose Informant Patient Reported? Taking?   bismuth subsalicylate (PEPTO-BISMOL) 262 mg/15 mL suspension   Yes Yes   Sig: Take 30 mL by mouth daily as needed for Indigestion.   cyanocobalamin 1,000 mcg tablet 08/09/2015  Yes Yes   Sig: Take 1,000 mcg by mouth daily.   ferrous sulfate 325 mg (65 mg iron) tablet 08/09/2015  No Yes   Sig: Take 1 Tab by mouth Daily (before breakfast).   furosemide (LASIX) 40 mg tablet 08/09/2015  Yes Yes   Sig: Take 40 mg by mouth daily.   ibuprofen (MOTRIN) 200 mg tablet   Yes Yes   Sig: Take 200 mg by mouth daily as needed for Pain.   multivitamin capsule 08/09/2015  No Yes   Sig: Take 1 Cap by mouth  daily.   spironolactone (ALDACTONE) 50 mg tablet 08/09/2015  Yes Yes   Sig: Take 50 mg by mouth daily.      Facility-Administered Medications: None       Hospital Medications:  Current Facility-Administered Medications   Medication Dose Route Frequency   ??? HYDROmorphone (PF) (DILAUDID) injection 0.5 mg  0.5 mg IntraVENous Q3H PRN   ??? albumin human 5% (BUMINATE) solution 25 g  25 g IntraVENous ONCE   ??? sodium chloride (NS) flush 5-10 mL  5-10 mL IntraVENous Q8H   ??? sodium chloride (NS) flush 5-10 mL  5-10 mL IntraVENous PRN   ??? naloxone (NARCAN) injection 0.4 mg  0.4 mg IntraVENous PRN   ??? diphenhydrAMINE (BENADRYL) injection 12.5 mg  12.5 mg IntraVENous Q4H PRN   ??? prochlorperazine (COMPAZINE) injection 5 mg  5 mg IntraVENous Q8H PRN   ??? lactulose (CHRONULAC) solution 20 g  20 g Oral BID   ??? pantoprazole (PROTONIX) 40 mg in sodium chloride 0.9 % 10 mL injection  40 mg IntraVENous Q12H   ??? cefTRIAXone (ROCEPHIN) 1 g in 0.9% sodium chloride (MBP/ADV) 50 mL  1 g IntraVENous Q24H   ??? furosemide (LASIX) tablet 40 mg  40 mg Oral DAILY    ??? spironolactone (ALDACTONE) tablet 50 mg  50 mg Oral DAILY   ??? zolpidem (AMBIEN) tablet 5 mg  5 mg Oral QHS PRN       Review of Systems: Admission ROS by Melynda Kellerobert O Omuria, MD from 08/16/2015 were reviewed with the patient and changes (other than per HPI) include: none      Objective:     Physical Exam:  Visit Vitals   ??? BP (!) 140/97 (BP 1 Location: Right arm, BP Patient Position: Sitting)   ??? Pulse 97   ??? Temp 97.9 ??F (36.6 ??C)   ??? Resp 20   ??? Ht 5\' 9"  (1.753 m)   ??? Wt 115 kg (253 lb 8.5 oz)   ??? SpO2 94%   ??? BMI 37.44 kg/m2     SpO2 Readings from Last 6 Encounters:   08/17/15 94%   10/16/14 98%   09/18/14 97%   05/20/14 97%   05/09/14 99%   05/04/14 96%          Intake/Output Summary (Last 24 hours) at 08/17/15 1819  Last data filed at 08/17/15 [id number]   Gross per 24 hour   Intake                0 ml   Output             2000 ml   Net            -2000 ml      General: no distress, comfortable  Skin:  No rash or palpable dermatologic mass lesions  HEENT: Pupils equal, sclera anicteric, oropharynx with no gross lesions  Cardiovascular: No abnormal audible heart sounds, well perfused, no edema  Respiratory:  No abnormal audible breath sounds, normal respiratory effort, no throacic deformity  GI:  , Abdomen distended, nontender, no mass, no free fluid, no rebound or guarding.  Musculoskeletal:  No skeletal deformity nor acute arthritis noted.  Neurological:  Motor and sensory function intact in upper extremeties  Psychiatric:  Normal affect, memory intact, appears to have insight into current illness  Lymphatic:  No cervical, supraclavicular, or periumbilic lymphadenopathy    Laboratory:    Recent Results (from the past 24 hour(s))   URINALYSIS W/MICROSCOPIC  Collection Time: 08/17/15 12:10 AM   Result Value Ref Range    Color DARK YELLOW      Appearance CLEAR CLEAR      Specific gravity 1.026 1.003 - 1.030      pH (UA) 6.0 5.0 - 8.0      Protein NEGATIVE  NEG mg/dL    Glucose NEGATIVE  NEG mg/dL     Ketone NEGATIVE  NEG mg/dL    Bilirubin NEGATIVE  NEG      Blood NEGATIVE  NEG      Urobilinogen 0.2 0.2 - 1.0 EU/dL    Nitrites NEGATIVE  NEG      Leukocyte Esterase NEGATIVE  NEG      WBC 0-4 0 - 4 /hpf    RBC 0-5 0 - 5 /hpf    Epithelial cells FEW FEW /lpf    Bacteria NEGATIVE  NEG /hpf   CBC WITH AUTOMATED DIFF    Collection Time: 08/17/15  4:45 AM   Result Value Ref Range    WBC 3.8 (L) 4.1 - 11.1 K/uL    RBC 3.01 (L) 4.10 - 5.70 M/uL    HGB 8.7 (L) 12.1 - 17.0 g/dL    HCT 54.0 (L) 98.1 - 50.3 %    MCV 88.7 80.0 - 99.0 FL    MCH 28.9 26.0 - 34.0 PG    MCHC 32.6 30.0 - 36.5 g/dL    RDW 19.1 (H) 47.8 - 14.5 %    PLATELET 50 (L) 150 - 400 K/uL    NEUTROPHILS 56 32 - 75 %    LYMPHOCYTES 13 12 - 49 %    MONOCYTES 18 (H) 5 - 13 %    EOSINOPHILS 12 (H) 0 - 7 %    BASOPHILS 1 0 - 1 %    ABS. NEUTROPHILS 2.2 1.8 - 8.0 K/UL    ABS. LYMPHOCYTES 0.5 (L) 0.8 - 3.5 K/UL    ABS. MONOCYTES 0.7 0.0 - 1.0 K/UL    ABS. EOSINOPHILS 0.5 (H) 0.0 - 0.4 K/UL    ABS. 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

## 2015-08-17 NOTE — Progress Notes (Signed)
Primary Nurse Theressa StampsAaron J Brower and Hessie DienerMAUREEN DUDEK, RN performed a dual skin assessment on this patient No impairment noted  Braden score is 23

## 2015-08-17 NOTE — Other (Signed)
Bedside shift change report given to Maureen, RN (oncoming nurse) by Chandra R. Sally, RN   (offgoing nurse). Report included the following information SBAR, Kardex and MAR.

## 2015-08-17 NOTE — Progress Notes (Signed)
[facility]  [address], [place], [place] [id number]  [phone]      Medical Progress Note      NAME: [name]   DOB:  02/04/1951  MRM:  [id number]    Date/Time: 08/17/2015  11:06 AM         Subjective:     Chief Complaint:  Pain: abdomen, moderate to severe, no better with pain meds, diffuse, aching    ROS:  (bold if positive, if negative)                Abd Pain                Objective:       Vitals:          Last 24hrs VS reviewed since prior progress note. Most recent are:    Visit Vitals   ??? BP 114/67 (BP 1 Location: Right arm, BP Patient Position: Supine)   ??? Pulse 85   ??? Temp 97.8 ??F (36.6 ??C)   ??? Resp 14   ??? Ht 5\' 9"  (1.753 m)   ??? Wt 115 kg (253 lb 8.5 oz)   ??? SpO2 94%   ??? BMI 37.44 kg/m2     SpO2 Readings from Last 6 Encounters:   08/17/15 94%   10/16/14 98%   09/18/14 97%   05/20/14 97%   05/09/14 99%   05/04/14 96%            Intake/Output Summary (Last 24 hours) at 08/17/15 1224  Last data filed at 08/17/15 [id number]   Gross per 24 hour   Intake                0 ml   Output             2000 ml   Net            -2000 ml          Exam:     Physical Exam:    Gen:  Well-developed, obese, in no acute distress  HEENT:  Pink conjunctivae, PERRL, hearing intact to voice, moist mucous membranes  Neck:  Supple, without masses, thyroid non-tender  Resp:  No accessory muscle use, clear breath sounds without wheezes rales or rhonchi  Card:  No murmurs, normal S1, S2 without thrills, bruits or peripheral edema, 2+ pedal pulses  Abd:  Soft, diffusely tender, distended without definite fluid wave, normoactive bowel sounds are present, no palpable organomegaly and no detectable hernias  Lymph:  No cervical or inguinal adenopathy  Musc:  No cyanosis or clubbing  Skin:  No rashes or ulcers, skin turgor is good, cap refill <2 sec  Neuro:  Cranial nerves are grossly intact, no focal motor weakness, follows commands appropriately   Psych:  Good insight, oriented to person, place and time, alert    Medications Reviewed: (see below)    Lab Data Reviewed: (see below)    ______________________________________________________________________    Medications:     Current Facility-Administered Medications   Medication Dose Route Frequency   ??? perflutren lipid microspheres (DEFINITY) contrast injection 2 mL  2 mL IntraVENous ONCE   ??? HYDROmorphone (PF) (DILAUDID) injection 0.5 mg  0.5 mg IntraVENous Q3H PRN   ??? sodium chloride (NS) flush 5-10 mL  5-10 mL IntraVENous Q8H   ??? sodium chloride (NS) flush 5-10 mL  5-10 mL IntraVENous PRN   ??? naloxone (NARCAN) injection 0.4 mg  0.4  mg IntraVENous PRN   ??? diphenhydrAMINE (BENADRYL) injection 12.5 mg  12.5 mg IntraVENous Q4H PRN   ??? prochlorperazine (COMPAZINE) injection 5 mg  5 mg IntraVENous Q8H PRN   ??? lactulose (CHRONULAC) solution 20 g  20 g Oral BID   ??? pantoprazole (PROTONIX) 40 mg in sodium chloride 0.9 % 10 mL injection  40 mg IntraVENous Q12H   ??? cefTRIAXone (ROCEPHIN) 1 g in 0.9% sodium chloride (MBP/ADV) 50 mL  1 g IntraVENous Q24H   ??? furosemide (LASIX) tablet 40 mg  40 mg Oral DAILY   ??? spironolactone (ALDACTONE) tablet 50 mg  50 mg Oral DAILY   ??? zolpidem (AMBIEN) tablet 5 mg  5 mg Oral QHS PRN            Lab Review:     Recent Labs      08/17/15   0445  08/16/15   1245   WBC  3.8*  3.4*   HGB  8.7*  8.9*   HCT  26.7*  28.3*   PLT  50*  53*     Recent Labs      08/16/15   1720  08/16/15   1245   NA   --   141   K   --   4.1   CL   --   108   CO2   --   31   GLU   --   154*   BUN   --   11   CREA   --   0.69*   CA   --   6.9*   ALB   --   2.6*   TBILI   --   1.0   SGOT   --   155*   ALT   --   121*   INR  1.3*   --      Lab Results   Component Value Date/Time    Glucose (POC) 110 03/12/2014 06:31 PM    Glucose (POC) 202 01/23/2013 02:22 PM    Glucose (POC) 103 01/23/2013 07:45 AM    Glucose (POC) 135 01/22/2013 09:12 PM    Glucose (POC) 141 01/22/2013 03:25 PM     Glucose (POC) 140 01/22/2013 11:22 AM    Glucose, POC 104 05/18/2009 07:19 PM     No results for input(s): PH, PCO2, PO2, HCO3, FIO2 in the last 72 hours.  Recent Labs      08/16/15   1720   INR  1.3*     Lab Results   Component Value Date/Time    Specimen Description: NASAL 03/11/2012 02:55 PM    Specimen Description: STOOL 07/27/2011 02:32 AM     Lab Results   Component Value Date/Time    Culture result: NO GROWTH AFTER 14 HOURS 08/16/2015 04:18 PM    Culture result: NO GROWTH 6 DAYS 02/06/2013 01:23 PM    Culture result: NO GROWTH 6 DAYS 02/06/2013 01:08 PM            Assessment:     Principal Problem:    Abdominal pain, acute, generalized (08/16/2015)    Active Problems:    Hepatitis C infection (12/23/2011)      Pancytopenia (HCC) (11/08/2012)      Melena (01/19/2013)      Varices, 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)      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)      Hyperammonemia (HCC) (09/15/2014)      Hepatic encephalopathy (HCC) (09/16/2014)  Obesity (BMI 30-39.9) (08/17/2015)           Plan:     Principal Problem:    Abdominal pain, acute, generalized (08/16/2015)   - due to ascites, likely mechanical pain   - cell count NOT c/w infection   - GI to see   - abdominal CT with portal hypertensive changes, minimal ascites   - patient wants something by mouth, will give clears for now    Active Problems:    Hepatitis C infection (12/23/2011)/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)   - GI to see      Pancytopenia (HCC) (11/08/2012)   - counts stable   - Hem/Onc input appreciated      Melena (01/19/2013)/Varices, 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)   - Hgb down with hydration     - likely a component of acute GI blood loss as well, potentially life threatening   - likely to need upper endoscopy to evaluate     - GI to see   - continue ceftriaxone in light of GI bleed      Hepatic encephalopathy, acute (HCC) (09/16/2014)/Hyperammonemia (HCC) (09/15/2014)   - ammonia down, continue lactulose       Total time spent with patient: 35 minutes                  Care Plan discussed with: Patient, Care Manager and Nursing Staff    Discussed:  Code Status, Care Plan and D/C Planning    Prophylaxis:  SCD's    Disposition:  Home w/Family           ___________________________________________________    Attending Physician: Marijo Filelay Pattie Flaharty, MD

## 2015-08-17 NOTE — Consults (Signed)
[facility]  [phone]    Hematology / Oncology Consult    Reason for Visit:   [name] is a 65 y.o. male who is seen in consultation at the request of Dr. Odette Horns for evaluation of pancytopenia.    History of Present Illness:     [name] was admitted on 08/16/2015 from the ED when he presented with c/o abd discomfort. Hemoccult in ED positive . Hx of cirrhosis with ascites drained (700 ml and drain remains in ) Therefore he was admitted for further eval and management.     [name] is a poor historian   Unable to state if he has been told in the past he has any problems with his Hgb or white cell count. 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   Abd pain has gotten better with drainage but continues to be very sore and does hurt where the pain is inserted. Denies SOB at present. Denies N/V.   States ETOH varies; only drank 1/2 beer last week.       No family at bedside.     Past Medical History:   Diagnosis Date   ??? Alcohol abuse    ??? Anemia    ??? GI bleed    ??? Hepatitis C virus    ??? Hernia    ??? Hypertension    ??? Ill-defined condition     anemia   ??? Varices       Past Surgical History:   Procedure Laterality Date   ??? UPPER GI ENDOSCOPY,LIGAT VARIX  01/20/2013           Social History   Substance Use Topics   ??? Smoking status: Former Smoker   ??? Smokeless tobacco: Never Used   ??? Alcohol use No      Comment: quit drinking December 2014      Family History   Problem Relation Age of Onset   ??? Heart Disease Father 10     MI   ??? Heart Disease Brother      Current Facility-Administered Medications   Medication Dose Route Frequency   ??? sodium chloride (NS) flush 5-10 mL  5-10 mL IntraVENous Q8H   ??? sodium chloride (NS) flush 5-10 mL  5-10 mL IntraVENous PRN   ??? morphine injection 2 mg  2 mg IntraVENous Q4H PRN    ??? naloxone (NARCAN) injection 0.4 mg  0.4 mg IntraVENous PRN   ??? diphenhydrAMINE (BENADRYL) injection 12.5 mg  12.5 mg IntraVENous Q4H PRN   ??? prochlorperazine (COMPAZINE) injection 5 mg  5 mg IntraVENous Q8H PRN   ??? lactulose (CHRONULAC) solution 20 g  20 g Oral BID   ??? pantoprazole (PROTONIX) 40 mg in sodium chloride 0.9 % 10 mL injection  40 mg IntraVENous Q12H   ??? cefTRIAXone (ROCEPHIN) 1 g in 0.9% sodium chloride (MBP/ADV) 50 mL  1 g IntraVENous Q24H   ??? furosemide (LASIX) tablet 40 mg  40 mg Oral DAILY   ??? spironolactone (ALDACTONE) tablet 50 mg  50 mg Oral DAILY   ??? zolpidem (AMBIEN) tablet 5 mg  5 mg Oral QHS PRN      No Known Allergies     Review of Systems: A complete review of systems was obtained, negative  except as described above.      Physical Exam:     Visit Vitals   ??? BP 114/67 (BP 1 Location: Right arm, BP Patient Position: Supine)   ??? Pulse 85   ??? Temp 97.8 ??F (36.6 ??C)   ??? Resp 14   ??? Ht 5' 9"  (1.753 m)   ??? Wt 115 kg (253 lb 8.5 oz)   ??? SpO2 94%   ??? BMI 37.44 kg/m2     ECOG PS: 2  General: No distress  Eyes: anicteric sclerae  HENT: Atraumatic, OP clear  Neck: Supple  Lymphatic: No cervical, supraclavicular, or inguinal adenopathy  Respiratory: CTAB, normal respiratory effort  CV: Normal rate, regular rhythm, no murmurs, no peripheral edema  GI: distended ; percutaneous drainage tube in place; Soft, nontender no masses, no hepatomegaly, no splenomegaly  Skin: No rashes, ecchymoses, or petechiae. Normal temperature, turgor, and texture.  Psych: Alert, oriented, appropriate affect, normal judgment/insight  Neuro: CN II-XII intact    Results:     Lab Results   Component Value Date/Time    WBC 3.8 08/17/2015 04:45 AM    HGB 8.7 08/17/2015 04:45 AM    HCT 26.7 08/17/2015 04:45 AM    PLATELET 50 08/17/2015 04:45 AM    MCV 88.7 08/17/2015 04:45 AM    ABS. NEUTROPHILS 2.2 08/17/2015 04:45 AM    Hemoglobin, POC 15.6 05/18/2009 07:19 PM    Hematocrit, POC 46 05/18/2009 07:19 PM     Lab Results    Component Value Date/Time    Sodium 141 08/16/2015 12:45 PM    Potassium 4.1 08/16/2015 12:45 PM    Chloride 108 08/16/2015 12:45 PM    CO2 31 08/16/2015 12:45 PM    Glucose 154 08/16/2015 12:45 PM    BUN 11 08/16/2015 12:45 PM    Creatinine 0.69 08/16/2015 12:45 PM    GFR est AA >60 08/16/2015 12:45 PM    GFR est non-AA >60 08/16/2015 12:45 PM    Calcium 6.9 08/16/2015 12:45 PM    Sodium, POC 140 05/18/2009 07:19 PM    Potassium, POC 4.2 05/18/2009 07:19 PM    Chloride, POC 102 05/18/2009 07:19 PM    Glucose (POC) 110 03/12/2014 06:31 PM    Creatinine, POC 0.8 05/18/2009 07:19 PM    Calcium, ionized (POC) 1.13 05/18/2009 07:19 PM     Lab Results   Component Value Date/Time    Bilirubin, total 1.0 08/16/2015 12:45 PM    ALT (SGPT) 121 08/16/2015 12:45 PM    AST (SGOT) 155 08/16/2015 12:45 PM    Alk. phosphatase 115 08/16/2015 12:45 PM    Protein, total 6.2 08/16/2015 12:45 PM    Albumin 2.6 08/16/2015 12:45 PM    Globulin 3.6 08/16/2015 12:45 PM     Lab Results   Component Value Date/Time    Reticulocyte count 4.2 11/08/2012 11:25 PM    Iron % saturation 19 01/20/2013 02:00 AM    TIBC 290 01/20/2013 02:00 AM    Ferritin 21 01/20/2013 02:00 AM    Vitamin B12 520 01/20/2013 02:00 AM    Folate 13.9 01/20/2013 02:00 AM    Folate, RBC 1906 01/20/2013 02:00 AM    Homocysteine, plasma 8.9 11/08/2012 11:25 PM    Sed rate, automated 15 01/20/2013 02:00 AM    C-Reactive protein <0.3 01/20/2013 02:00 AM    TSH 0.84 01/20/2013 02:00 AM    Lipase 222 08/16/2015 12:45 PM    Hep C  virus Ab Interp. POSITIVE 07/28/2011  03:02 AM    Hepatitis C virus Ab REACTIVE 03/06/2014 06:45 AM     Lab Results   Component Value Date/Time    INR 1.3 08/16/2015 05:20 PM    aPTT 28.3 08/16/2015 05:20 PM    aPTT 31.0 10/16/2014 02:25 PM    D DIMER 3.13 04/27/2010 03:45 PM     08/16/2015 XR CHEST   IMPRESSION  Impression: No acute process.    08/17/2015 CT ABD   pending    Assessment and Recommendations:   1. Pancytopenia  appears to be chronic   Likely secondary to cirrhosis of liver  Will obtain CT of liver today      2. Ascites  Secondary to cirrhosis  Percutaneous tube in place  abx coverage  GI consulted    3. SOB  Improved  CXR unremarkable    4. Abd pain  Secondary to cirrhosis/ascites  Continue to monitor    5. Hemoccult stool  Positive   GI consulted    The above exam and treatment plan were reviewed with Dr Laurena Bering.    Signed By: Lorelle Gibbs, NP     August 17, 2015

## 2015-08-17 NOTE — Other (Deleted)
=>  Acute Hepatic Encephalopathy without coma AEB slow speech and  hyperammonemia treated with lactulose   =>Other Explanation of clinical findings  =>Unable to Determine (no explanation of clinical findings)    The medical record reflects the following clinical findings, treatment, and risk factors:  Risk Factors: 65 yo M admitted  cirrhosis of the liver with ascites   Clinical Indicators: H/P states " Hepatic encephalopathy (HCC) (09/16/2014)/  Hyperammonemia (HCC) (09/15/2014): speech seems slow. Ammonia level of 75. Start lactulose. Monitor clinically??"   Treatment: lactulose      Please clarify and document your clinical opinion in the progress notes and discharge summary including the definitive and/or presumptive diagnosis, (suspected or probable), related to the above clinical findings. Please include clinical findings supporting your diagnosis.    Thank you for your time   [facility] Northeast HospitalJanine W.Conley RN/BSN, CCDS  Desk:   [phone]   Other:  [phone]

## 2015-08-17 NOTE — Progress Notes (Signed)
Echo completed. Report to follow.

## 2015-08-17 NOTE — Progress Notes (Signed)
Dr. Verdie MosherLiu notified that pt pulled out ascites drain to right abdomen.

## 2015-08-17 NOTE — Consults (Signed)
Consults  by Lorelle Gibbs, NP at 08/17/15 1100                Author: Lorelle Gibbs, NP  Service: Hematology  Author Type: Nurse Practitioner       Filed: 08/17/15 1144  Date of Service: 08/17/15 1100  Status: Attested           Editor: Lorelle Gibbs, NP (Nurse Practitioner)  Cosigner: Joyce Gross, MD at 08/17/15 1607            Consult Orders        1. IP CONSULT TO HEMATOLOGY [[id number]] ordered by Wilber Bihari, MD at 08/16/15 1844                         Attestation signed by Joyce Gross, MD at 08/17/15 1607          I have seen and examined the patient and agree with below.      Pancytopenia -- appears to be secondary to cirrhosis of liver, will obtain CT of liver to evaluate.  Will consider bone marrow biopsy pending test result.      Thank you for this consult.  All of the patient's questions were answered today                                 [facility] [place]   [phone]      Hematology / Oncology Consult        Reason for Visit:     [name] is a 65 y.o.  male who is seen in consultation at the request of Dr. Odette Horns for evaluation of pancytopenia.        History of Present Illness:        [name] was admitted on 08/16/2015 from the ED when he presented with c/o abd discomfort. Hemoccult in ED positive . Hx of cirrhosis with ascites drained (700 ml and drain remains in ) Therefore he was admitted for further eval and management.       Todd Rennert is a poor historian    Unable to state if he has been told in the past he has any problems with his Hgb or white cell count. 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    Abd pain has gotten better with drainage but continues to be very sore and does hurt where the pain is inserted. Denies SOB at present. Denies N/V.    States ETOH varies;  only drank 1/2 beer last week.          No family at bedside.         Past Medical History:        Diagnosis  Date         ?  Alcohol abuse       ?  Anemia       ?  GI bleed       ?  Hepatitis C virus       ?  Hernia       ?  Hypertension       ?  Ill-defined condition  anemia         ?  Varices             Past Surgical History:         Procedure  Laterality  Date          ?  UPPER GI ENDOSCOPY,LIGAT VARIX    01/20/2013                      Social History       Substance Use Topics         ?  Smoking status:  Former Smoker     ?  Smokeless tobacco:  Never Used     ?  Alcohol use  No                Comment: quit drinking December 2014           Family History         Problem  Relation  Age of Onset          ?  Heart Disease  Father  68             MI          ?  Heart Disease  Brother            Current Facility-Administered Medications          Medication  Dose  Route  Frequency           ?  sodium chloride (NS) flush 5-10 mL   5-10 mL  IntraVENous  Q8H     ?  sodium chloride (NS) flush 5-10 mL   5-10 mL  IntraVENous  PRN     ?  morphine injection 2 mg   2 mg  IntraVENous  Q4H PRN     ?  naloxone (NARCAN) injection 0.4 mg   0.4 mg  IntraVENous  PRN     ?  diphenhydrAMINE (BENADRYL) injection 12.5 mg   12.5 mg  IntraVENous  Q4H PRN     ?  prochlorperazine (COMPAZINE) injection 5 mg   5 mg  IntraVENous  Q8H PRN     ?  lactulose (CHRONULAC) solution 20 g   20 g  Oral  BID     ?  pantoprazole (PROTONIX) 40 mg in sodium chloride 0.9 % 10 mL injection   40 mg  IntraVENous  Q12H     ?  cefTRIAXone (ROCEPHIN) 1 g in 0.9% sodium chloride (MBP/ADV) 50 mL   1 g  IntraVENous  Q24H     ?  furosemide (LASIX) tablet 40 mg   40 mg  Oral  DAILY     ?  spironolactone (ALDACTONE) tablet 50 mg   50 mg  Oral  DAILY           ?  zolpidem (AMBIEN) tablet 5 mg   5 mg  Oral  QHS PRN         No Known Allergies       Review of Systems: A complete review of systems was obtained, negative except as described above.           Physical  Exam:          Visit Vitals         ?  BP  114/67 (BP 1 Location: Right arm, BP Patient Position: Supine)     ?  Pulse  85     ?  Temp  97.8 ??F (36.6 ??C)     ?  Resp  14     ?  Ht  5' 9" (1.753 m)     ?  Wt  115 kg (253 lb 8.5 oz)     ?  SpO2  94%         ?  BMI  37.44 kg/m2        ECOG PS: 2   General: No distress   Eyes: a nicteric sclerae   HENT: Atraumatic, OP clear   Neck: Supple   Lymphatic: No cervical, supraclavicular, or inguinal adenopathy   Respiratory: CTAB, normal respiratory effort   CV: Normal rate, regular rhythm, no murmurs, no peripheral edema   GI: distended ; percutaneous drainage tube in place;  Soft, nontender no masses, no hepatomegaly, no splenomegaly   Skin: No rashes, ecchymoses, or petechiae. Normal temperature, turgor, and texture.   Psych: Alert, oriented, appropriate affect, normal judgment/insight   Neuro: CN II-XII intact        Results:          Lab Results         Component  Value  Date/Time            WBC  3.8  08/17/2015 04:45 AM       HGB  8.7  08/17/2015 04:45 AM       HCT  26.7  08/17/2015 04:45 AM       PLATELET  50  08/17/2015 04:45 AM       MCV  88.7  08/17/2015 04:45 AM       ABS. NEUTROPHILS  2.2  08/17/2015 04:45 AM       Hemoglobin, POC  15.6  05/18/2009 07:19 PM            Hematocrit, POC  46  05/18/2009 07:19 PM          Lab Results         Component  Value  Date/Time            Sodium  141  08/16/2015 12:45 PM       Potassium  4.1  08/16/2015 12:45 PM       Chloride  108  08/16/2015 12:45 PM       CO2  31  08/16/2015 12:45 PM       Glucose  154  08/16/2015 12:45 PM       BUN  11  08/16/2015 12:45 PM       Creatinine  0.69  08/16/2015 12:45 PM       GFR est AA  >60  08/16/2015 12:45 PM       GFR est non-AA  >60  08/16/2015 12:45 PM       Calcium  6.9  08/16/2015 12:45 PM       Sodium, POC  140  05/18/2009 07:19 PM       Potassium, POC  4.2  05/18/2009 07:19 PM       Chloride, POC  102  05/18/2009 07:19 PM            Glucose (POC)  110  03/12/2014 06:31 PM             Creatinine, POC  0.8  05/18/2009 07:19 PM            Calcium, ionized (POC)  1.13  05/18/2009 07:19 PM          Lab Results  Component  Value  Date/Time            Bilirubin, total  1.0  08/16/2015 12:45 PM       ALT (SGPT)  121  08/16/2015 12:45 PM       AST (SGOT)  155  08/16/2015 12:45 PM       Alk. phosphatase  115  08/16/2015 12:45 PM       Protein, total  6.2  08/16/2015 12:45 PM       Albumin  2.6  08/16/2015 12:45 PM            Globulin  3.6  08/16/2015 12:45 PM          Lab Results         Component  Value  Date/Time            Reticulocyte count  4.2  11/08/2012 11:25 PM       Iron % saturation  19  01/20/2013 02:00 AM       TIBC  290  01/20/2013 02:00 AM       Ferritin  21  01/20/2013 02:00 AM       Vitamin B12  520  01/20/2013 02:00 AM       Folate  13.9  01/20/2013 02:00 AM       Folate, RBC  1906  01/20/2013 02:00 AM       Homocysteine, plasma  8.9  11/08/2012 11:25 PM       Sed rate, automated  15  01/20/2013 02:00 AM       C-Reactive protein  <0.3  01/20/2013 02:00 AM       TSH  0.84  01/20/2013 02:00 AM       Lipase  222  08/16/2015 12:45 PM       Hep C  virus Ab Interp.  POSITIVE  07/28/2011 03:02 AM            Hepatitis C virus Ab  REACTIVE  03/06/2014 06:45 AM          Lab Results         Component  Value  Date/Time            INR  1.3  08/16/2015 05:20 PM       aPTT  28.3  08/16/2015 05:20 PM       aPTT  31.0  10/16/2014 02:25 PM            D DIMER  3.13  04/27/2010 03:45 PM        08/16/2015 XR CHEST    IMPRESSION   Impression: No acute process.      08/17/2015 CT ABD    pending        Assessment and Recommendations:     1. Pancytopenia   appears to be chronic   Likely secondary to cirrhosis of liver   Will obtain CT of liver today         2. Ascites   Secondary to cirrhosis   Percutaneous tube in place   abx coverage   GI consulted      3. SOB   Improved   CXR unremarkable      4. Abd pain   Secondary to cirrhosis/ascites   Continue to monitor      5. Hemoccult stool   Positive    GI  consulted      The above exam and treatment plan were reviewed with Dr Laurena Bering.  Signed By:  Lorelle Gibbs, NP        August 17, 2015

## 2015-08-17 NOTE — Consults (Signed)
Consults  by Leta SpellerLyons, Karyme Mcconathy D, MD at 08/17/15 1819                Author: Leta SpellerLyons, Ravonda Brecheen D, MD  Service: --  Author Type: Physician       Filed: 08/17/15 1828  Date of Service: 08/17/15 1819  Status: Signed          Editor: Leta SpellerLyons, Analaya Hoey D, MD (Physician)            Consult Orders        1. IP CONSULT TO GASTROENTEROLOGY [[id number]] ordered by Melynda Kellermuria, Robert O, MD at 08/16/15 1847                                                              Cristal Deerhristopher D. Nunzio CoryLyons, MD   [phone] office   [phone] voicemail     Gastroenterology Consultation Note         Admit Date: 08/16/2015   Consult Date: 08/17/2015     I greatly appreciate your asking me to see [name] , thank you very much for the opportunity to participate in his care.        Narrative Assessment and Plan     ??  Cirrhosis   ??  HCV - questionable treatment for this, not clear if he completed therapy or responded.  Check HCV PCR for viral load   ??  EtOH abuse - last consumption 2 days prior to admission - unequivocally advised total abstinence from ethanol, he and wife indicate understanding and agree   ??  Ascites- treatment to include paracentesis (drain dry) and afterwards low sodium diet and diuretics.  Nutrition consult for low sodium diet ed.   ??  Varices with history of bleeding - EGD tomorrow NPO after midnight   ??  HCC screening - had CT today      After discharge he will need follow up [facility] to consider anti-HCV therapy and longitudinal therapy for cirrhosis.         Subjective:        Chief Complaint: Cirrhosis      History of Present Illness: Cirrhosis, established and long standing.  EtOH abuse but now he reports low volume intake, wife disputes, and has history of HCV.  He describes some treatment for hepatitis but details are not clear.  [facility].  Now complains of abdominal distension and he is having paracentesis.      PCP:   Phys Other, MD         Past Medical History:        Diagnosis  Date         ?  Alcohol abuse       ?  Anemia       ?  GI bleed       ?  Hepatitis C virus       ?  Hernia       ?  Hypertension       ?  Ill-defined condition            anemia         ?  Obesity (BMI 30-39.9)  08/17/2015         ?  Varices  Past Surgical History:         Procedure  Laterality  Date          ?  UPPER GI ENDOSCOPY,LIGAT VARIX    01/20/2013                        Social History       Substance Use Topics         ?  Smoking status:  Former Smoker     ?  Smokeless tobacco:  Never Used     ?  Alcohol use  No                Comment: quit drinking December 2014              Family History         Problem  Relation  Age of Onset          ?  Heart Disease  Father  19             MI          ?  Heart Disease  Brother              No Known Allergies             Home Medications:     Prior to Admission Medications     Prescriptions  Last Dose  Informant  Patient Reported?  Taking?      bismuth subsalicylate (PEPTO-BISMOL) 262 mg/15 mL suspension      Yes  Yes      Sig: Take 30 mL by mouth daily as needed for Indigestion.      cyanocobalamin 1,000 mcg tablet  08/09/2015    Yes  Yes      Sig: Take 1,000 mcg by mouth daily.      ferrous sulfate 325 mg (65 mg iron) tablet  08/09/2015    No  Yes      Sig: Take 1 Tab by mouth Daily (before breakfast).      furosemide (LASIX) 40 mg tablet  08/09/2015    Yes  Yes      Sig: Take 40 mg by mouth daily.      ibuprofen (MOTRIN) 200 mg tablet      Yes  Yes      Sig: Take 200 mg by mouth daily as needed for Pain.      multivitamin capsule  08/09/2015    No  Yes      Sig: Take 1 Cap by mouth daily.      spironolactone (ALDACTONE) 50 mg tablet  08/09/2015    Yes  Yes      Sig: Take 50 mg by mouth daily.               Facility-Administered Medications: None           Hospital Medications:     Current Facility-Administered Medications          Medication  Dose  Route  Frequency           ?  HYDROmorphone (PF) (DILAUDID) injection  0.5 mg   0.5 mg  IntraVENous  Q3H PRN     ?  albumin human 5% (BUMINATE) solution 25 g   25 g  IntraVENous  ONCE     ?  sodium chloride (NS) flush 5-10 mL   5-10 mL  IntraVENous  Q8H     ?  sodium chloride (NS) flush 5-10 mL   5-10 mL  IntraVENous  PRN     ?  naloxone (NARCAN) injection 0.4 mg   0.4 mg  IntraVENous  PRN     ?  diphenhydrAMINE (BENADRYL) injection 12.5 mg   12.5 mg  IntraVENous  Q4H PRN     ?  prochlorperazine (COMPAZINE) injection 5 mg   5 mg  IntraVENous  Q8H PRN     ?  lactulose (CHRONULAC) solution 20 g   20 g  Oral  BID     ?  pantoprazole (PROTONIX) 40 mg in sodium chloride 0.9 % 10 mL injection   40 mg  IntraVENous  Q12H     ?  cefTRIAXone (ROCEPHIN) 1 g in 0.9% sodium chloride (MBP/ADV) 50 mL   1 g  IntraVENous  Q24H     ?  furosemide (LASIX) tablet 40 mg   40 mg  Oral  DAILY     ?  spironolactone (ALDACTONE) tablet 50 mg   50 mg  Oral  DAILY           ?  zolpidem (AMBIEN) tablet 5 mg   5 mg  Oral  QHS PRN           Review of Systems: Admission ROS by Melynda Keller, MD  from 08/16/2015 were reviewed with the patient and changes (other than per HPI) include: none           Objective:        Physical Exam:     Visit Vitals         ?  BP  (!) 140/97 (BP 1 Location: Right arm, BP Patient Position: Sitting)     ?  Pulse  97     ?  Temp  97.9 ??F (36.6 ??C)     ?  Resp  20     ?  Ht  5\' 9"  (1.753 m)     ?  Wt  115 kg (253 lb 8.5 oz)     ?  SpO2  94%         ?  BMI  37.44 kg/m2        SpO2 Readings from Last 6 Encounters:      08/17/15  94%      10/16/14  98%      09/18/14  97%      05/20/14  97%      05/09/14  99%      05/04/14  96%                    Intake/Output Summary (Last 24 hours) at 08/17/15 1819  Last data filed at 08/17/15 1610        Gross per 24 hour     Intake                 0 ml     Output              2000 ml     Net             -2000 ml         General: no distress, comfortable   Skin:  No rash or palpable dermatologic mass lesions   HEENT: Pupils equal, sclera anicteric,  oropharynx with no gross lesions   Cardiovascular: No abnormal audible heart sounds, well perfused, no edema   Respiratory:  No abnormal audible breath sounds, normal respiratory effort, no  throacic deformity   GI:  , Abdomen distended, nontender, no mass, no free fluid, no rebound or guarding.   Musculoskeletal:  No skeletal deformity nor acute arthritis noted.   Neurological:  Motor and sensory function intact in upper extremeties   Psychiatric:  Normal affect, memory intact, appears to have insight into current illness   Lymphatic:  No cervical, supraclavicular, or periumbilic lymphadenopathy      Laboratory:       Recent Results (from the past 24 hour(s))     URINALYSIS W/MICROSCOPIC          Collection Time: 08/17/15 12:10 AM         Result  Value  Ref Range            Color  DARK YELLOW          Appearance  CLEAR  CLEAR         Specific gravity  1.026  1.003 - 1.030         pH (UA)  6.0  5.0 - 8.0         Protein  NEGATIVE   NEG mg/dL       Glucose  NEGATIVE   NEG mg/dL       Ketone  NEGATIVE   NEG mg/dL       Bilirubin  NEGATIVE   NEG         Blood  NEGATIVE   NEG         Urobilinogen  0.2  0.2 - 1.0 EU/dL       Nitrites  NEGATIVE   NEG         Leukocyte Esterase  NEGATIVE   NEG         WBC  0-4  0 - 4 /hpf       RBC  0-5  0 - 5 /hpf       Epithelial cells  FEW  FEW /lpf       Bacteria  NEGATIVE   NEG /hpf       CBC WITH AUTOMATED DIFF          Collection Time: 08/17/15  4:45 AM         Result  Value  Ref Range            WBC  3.8 (L)  4.1 - 11.1 K/uL       RBC  3.01 (L)  4.10 - 5.70 M/uL       HGB  8.7 (L)  12.1 - 17.0 g/dL       HCT  16.126.7 (L)  09.636.6 - 50.3 %       MCV  88.7  80.0 - 99.0 FL       MCH  28.9  26.0 - 34.0 PG       MCHC  32.6  30.0 - 36.5 g/dL       RDW  04.518.2 (H)  40.911.5 - 14.5 %       PLATELET  50 (L)  150 - 400 K/uL       NEUTROPHILS  56  32 - 75 %       LYMPHOCYTES  13  12 - 49 %       MONOCYTES  18 (H)  5 - 13 %       EOSINOPHILS  12 (H)  0 - 7 %            BASOPHILS  1  0 - 1 %  ABS.  NEUTROPHILS  2.2  1.8 - 8.0 K/UL       ABS. LYMPHOCYTES  0.5 (L)  0.8 - 3.5 K/UL       ABS. MONOCYTES  0.7  0.0 - 1.0 K/UL       ABS. EOSINOPHILS  0.5 (H)  0.0 - 0.4 K/UL       ABS. 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

## 2015-08-18 LAB — METABOLIC PANEL, BASIC
Anion gap: 4 mmol/L — ABNORMAL LOW (ref 5–15)
BUN/Creatinine ratio: 10 — ABNORMAL LOW (ref 12–20)
BUN: 6 MG/DL (ref 6–20)
CO2: 29 mmol/L (ref 21–32)
Calcium: 7 MG/DL — ABNORMAL LOW (ref 8.5–10.1)
Chloride: 103 mmol/L (ref 97–108)
Creatinine: 0.62 MG/DL — ABNORMAL LOW (ref 0.70–1.30)
GFR est AA: >60 mL/min/{1.73_m2} (ref >60)
GFR est non-AA: >60 mL/min/{1.73_m2} (ref >60)
Glucose: 68 mg/dL (ref 65–100)
Potassium: 4.8 mmol/L (ref 3.5–5.1)
Sodium: 136 mmol/L (ref 136–145)

## 2015-08-18 LAB — CBC W/O DIFF
HCT: 25.4 % — ABNORMAL LOW (ref 36.6–50.3)
HGB: 8.2 g/dL — ABNORMAL LOW (ref 12.1–17.0)
MCH: 28.3 PG (ref 26.0–34.0)
MCHC: 32.3 g/dL (ref 30.0–36.5)
MCV: 87.6 FL (ref 80.0–99.0)
PLATELET: 52 10*3/uL — ABNORMAL LOW (ref 150–400)
RBC: 2.9 M/uL — ABNORMAL LOW (ref 4.10–5.70)
RDW: 17.9 % — ABNORMAL HIGH (ref 11.5–14.5)
WBC: 3.7 10*3/uL — ABNORMAL LOW (ref 4.1–11.1)

## 2015-08-18 LAB — EKG, 12 LEAD, INITIAL
Atrial Rate: 85 {beats}/min
Calculated P Axis: 15 degrees
Calculated R Axis: 28 degrees
Calculated T Axis: 54 degrees
P-R Interval: 98 ms
Q-T Interval: 396 ms
QRS Duration: 78 ms
QTC Calculation (Bezet): 471 ms
Ventricular Rate: 85 {beats}/min

## 2015-08-18 LAB — AMMONIA: Ammonia, plasma: 59 umol/L — ABNORMAL HIGH (ref <32)

## 2015-08-18 LAB — PHOSPHORUS: Phosphorus: 3.5 MG/DL (ref 2.6–4.7)

## 2015-08-18 LAB — MAGNESIUM: Magnesium: 1.5 mg/dL — ABNORMAL LOW (ref 1.6–2.4)

## 2015-08-18 LAB — EKG 12-LEAD
Atrial Rate: 85 {beats}/min
P Axis: 15 degrees
P-R Interval: 98 ms
Q-T Interval: 396 ms
QRS Duration: 78 ms
QTc Calculation (Bazett): 471 ms
R Axis: 28 degrees
T Axis: 54 degrees
Ventricular Rate: 85 {beats}/min

## 2015-08-18 MED ORDER — PROPOFOL 10 MG/ML IV EMUL
10 mg/mL | INTRAVENOUS | Status: AC
Start: 2015-08-18 — End: ?

## 2015-08-18 MED ORDER — LIDOCAINE (PF) 20 MG/ML (2 %) IV SYRINGE
100 mg/5 mL (2 %) | INTRAVENOUS | Status: AC
Start: 2015-08-18 — End: ?

## 2015-08-18 MED ORDER — LIDOCAINE (PF) 20 MG/ML (2 %) IJ SOLN
20 mg/mL (2 %) | INTRAMUSCULAR | Status: DC | PRN
Start: 2015-08-18 — End: 2015-08-18
  Administered 2015-08-18: 12:00:00 via INTRAVENOUS

## 2015-08-18 MED ORDER — MIDAZOLAM 1 MG/ML IJ SOLN
1 mg/mL | INTRAMUSCULAR | Status: DC | PRN
Start: 2015-08-18 — End: 2015-08-18

## 2015-08-18 MED ORDER — FLUMAZENIL 0.1 MG/ML IV SOLN
0.1 mg/mL | INTRAVENOUS | Status: DC | PRN
Start: 2015-08-18 — End: 2015-08-18

## 2015-08-18 MED ORDER — NALOXONE 0.4 MG/ML INJECTION
0.4 mg/mL | INTRAMUSCULAR | Status: DC | PRN
Start: 2015-08-18 — End: 2015-08-18

## 2015-08-18 MED ORDER — MAGNESIUM SULFATE 2 GRAM/50 ML IVPB
2 gram/50 mL (4 %) | INTRAVENOUS | Status: AC
Start: 2015-08-18 — End: 2015-08-18
  Administered 2015-08-18 (×2): via INTRAVENOUS

## 2015-08-18 MED ORDER — PROPOFOL 10 MG/ML IV EMUL
10 mg/mL | INTRAVENOUS | Status: DC | PRN
Start: 2015-08-18 — End: 2015-08-18
  Administered 2015-08-18: 12:00:00 via INTRAVENOUS

## 2015-08-18 MED ORDER — SODIUM CHLORIDE 0.9 % IV
INTRAVENOUS | Status: AC
Start: 2015-08-18 — End: 2015-08-18
  Administered 2015-08-18: 12:00:00 via INTRAVENOUS

## 2015-08-18 MED ORDER — FENTANYL CITRATE (PF) 50 MCG/ML IJ SOLN
50 mcg/mL | INTRAMUSCULAR | Status: DC | PRN
Start: 2015-08-18 — End: 2015-08-18

## 2015-08-18 MED ORDER — SIMETHICONE 40 MG/0.6 ML ORAL DROPS, SUSP
40 mg/0.6 mL | ORAL | Status: DC | PRN
Start: 2015-08-18 — End: 2015-08-18

## 2015-08-18 MED FILL — MAGNESIUM SULFATE 2 GRAM/50 ML IVPB: 2 gram/50 mL (4 %) | INTRAVENOUS | Qty: 50

## 2015-08-18 MED FILL — HYDROMORPHONE (PF) 1 MG/ML IJ SOLN: 1 mg/mL | INTRAMUSCULAR | Qty: 1

## 2015-08-18 MED FILL — SPIRONOLACTONE 25 MG TAB: 25 mg | ORAL | Qty: 2

## 2015-08-18 MED FILL — PROTONIX 40 MG INTRAVENOUS SOLUTION: 40 mg | INTRAVENOUS | Qty: 40

## 2015-08-18 MED FILL — LACTULOSE 20 GRAM/30 ML ORAL SOLUTION: 20 gram/30 mL | ORAL | Qty: 30

## 2015-08-18 MED FILL — SODIUM CHLORIDE 0.9 % IV: INTRAVENOUS | Qty: 1000

## 2015-08-18 MED FILL — INFANTS GAS RELIEF 40 MG/0.6 ML ORAL DROPS,SUSPENSION: 40 mg/0.6 mL | ORAL | Qty: 30

## 2015-08-18 MED FILL — DIPRIVAN 10 MG/ML INTRAVENOUS EMULSION: 10 mg/mL | INTRAVENOUS | Qty: 120

## 2015-08-18 MED FILL — DIPRIVAN 10 MG/ML INTRAVENOUS EMULSION: 10 mg/mL | INTRAVENOUS | Qty: 50

## 2015-08-18 MED FILL — FUROSEMIDE 40 MG TAB: 40 mg | ORAL | Qty: 1

## 2015-08-18 MED FILL — CEFTRIAXONE 1 GRAM SOLUTION FOR INJECTION: 1 gram | INTRAMUSCULAR | Qty: 1

## 2015-08-18 MED FILL — LIDOCAINE (PF) 20 MG/ML (2 %) IJ SOLN: 20 mg/mL (2 %) | INTRAMUSCULAR | Qty: 100

## 2015-08-18 MED FILL — LIDOCAINE (PF) 20 MG/ML (2 %) IV SYRINGE: 100 mg/5 mL (2 %) | INTRAVENOUS | Qty: 5

## 2015-08-18 MED FILL — ZOLPIDEM 5 MG TAB: 5 mg | ORAL | Qty: 1

## 2015-08-18 NOTE — Anesthesia Pre-Procedure Evaluation (Signed)
Anesthetic History   No history of anesthetic complications            Review of Systems / Medical History  Patient summary reviewed and pertinent labs reviewed    Pulmonary  Within defined limits                 Neuro/Psych   Within defined limits           Cardiovascular  Within defined limits                Exercise tolerance: >4 METS     GI/Hepatic/Renal     GERD  Hepatitis: type C    Liver disease    Comments: cirrhosis Endo/Other        Anemia     Other Findings   Comments: Thrombocytopenia   Hx of EtOH abuse  Esophageal varices           Physical Exam    Airway  Mallampati: III  TM Distance: 4 - 6 cm  Neck ROM: normal range of motion   Mouth opening: Normal     Cardiovascular    Rhythm: regular  Rate: normal         Dental    Dentition: Lower braces     Pulmonary  Breath sounds clear to auscultation               Abdominal         Other Findings            Anesthetic Plan    ASA: 3  Anesthesia type: MAC            Anesthetic plan and risks discussed with: Patient

## 2015-08-18 NOTE — Other (Signed)
Evalyn CascoRandolph C Sotto  08/06/1950  [id number]    Situation:    Scheduled Procedure: Procedure(s):  ESOPHAGOGASTRODUODENOSCOPY (EGD)  Verbal report received from: Hessie DienerMaureen Dudek, RN  Preoperative diagnosis: cirrhosis    Background:    Procedure: Procedure(s):  ESOPHAGOGASTRODUODENOSCOPY (EGD)  Physician performing procedure; Dr. Nunzio CoryLyons    SBAR QUESTIONS FLOOR TO ENDO RN    NPO Status/Last PO Intake: midnight    Pregnancy Test:Not applicable If yes, result: none    Is the patient taking Blood Thinners: NO   Is the patient diabetic:no             Does the patient have a Pacemaker/Defibrillator in place?: no   Does the patient need antibiotics before/during/after procedure: no   If the patient is having a colon, How much prep was drank? n/a    Does the patient have SCD in place:no   Is patient on CONTACT precautions:no            Assessment:  Are the vital signs stable prior to patient coming to ENDO?  yes  Is the patient alert/oriented and able to sign consent for the procedures:yes    Does the patient have a patient IV in place? yes     Recommendation:  Family or Friend present no     Permission to share finding with Family or Friend n/a

## 2015-08-18 NOTE — Progress Notes (Signed)
TRANSFER - OUT REPORT:    Verbal report given to Dois DavenportSandra on [name]  being transferred to Main 5 for routine progression of care       Report consisted of patient???s Situation, Background, Assessment and   Recommendations(SBAR).     Information from the following report(s) Procedure Summary was reviewed with the receiving nurse.    Lines:   Peripheral IV 08/18/15 Right Wrist (Active)   Site Assessment Clean, dry, & intact 08/18/2015  7:41 AM   Phlebitis Assessment 0 08/18/2015  7:41 AM   Infiltration Assessment 0 08/18/2015  7:41 AM   Dressing Status Clean, dry, & intact 08/18/2015  7:41 AM   Dressing Type Tape;Transparent 08/18/2015  7:41 AM   Hub Color/Line Status Pink 08/18/2015  7:41 AM   Action Taken Blood drawn 08/18/2015  3:51 AM   Alcohol Cap Used Yes 08/18/2015  7:41 AM        Opportunity for questions and clarification was provided.      Patient transported with:   Engineer, manufacturing systemsTransport tech

## 2015-08-18 NOTE — Progress Notes (Signed)
Dr Lyons in to speak to patient

## 2015-08-18 NOTE — Anesthesia Post-Procedure Evaluation (Signed)
Post-Anesthesia Evaluation and Assessment    Patient: [name] MRN: [id number]  SSN: [id number]    Date of Birth: 08/16/1950  Age: 65 y.o.  Sex: male       Cardiovascular Function/Vital Signs  Visit Vitals   ??? BP 116/81   ??? Pulse 96   ??? Temp 36.9 ??C (98.5 ??F)   ??? Resp 11   ??? Ht 5\' 9"  (1.753 m)   ??? Wt 115 kg (253 lb 8.5 oz)   ??? SpO2 96%   ??? BMI 37.44 kg/m2       Patient is status post MAC anesthesia for Procedure(s):  ESOPHAGOGASTRODUODENOSCOPY (EGD).    Nausea/Vomiting: None    Postoperative hydration reviewed and adequate.    Pain:  Pain Scale 1: Numeric (0 - 10) (08/18/15 0840)  Pain Intensity 1: 0 (08/18/15 0840)   Managed    Neurological Status:   Neuro  Neurologic State: Alert (08/18/15 0725)  Orientation Level: Oriented X4 (08/18/15 0725)  Cognition: Appropriate decision making;Appropriate for age attention/concentration;Appropriate safety awareness;Follows commands (08/18/15 0725)  Speech: Clear (08/18/15 0725)  LUE Motor Response: Purposeful (08/18/15 0725)  LLE Motor Response: Purposeful (08/18/15 0725)  RUE Motor Response: Purposeful (08/18/15 0725)  RLE Motor Response: Purposeful (08/18/15 0725)   At baseline    Mental Status and Level of Consciousness: Arousable    Pulmonary Status:   O2 Device: Room air (08/18/15 0840)   Adequate oxygenation and airway patent    Complications related to anesthesia: None    Post-anesthesia assessment completed. No concerns    Signed By: Neldon NewportWilliam Christian Nam Vossler, MD     August 18, 2015

## 2015-08-18 NOTE — Progress Notes (Signed)
Occupational therapy note:  Chart reviewed.  Attempted to see patient yesterday for OT evaluation, patient declined activity. Today, patient received, supine in bed, girlfriend lying next to patient.  Therapist provided introduction and role of OT. Patient reports no difficulty with ADL tasks and girlfriend confirms. She adds, that patient is slow to perform dressing tasks due to increased fluid, but he able to perform without assistance. Patient reports only problem is pain to left wrist secondary to carpel tunnel. Patient reports no need for OT services, patient girlfriend in agreement.  Noted patient supervision to independent level at PT evaluation.   Will sign off at this time.

## 2015-08-18 NOTE — Progress Notes (Signed)
[facility]  [address], [place], [place] [id number]  [phone]      Medical Progress Note      NAME: Todd CascoRandolph C Wickliff   DOB:  03/21/1950  MRM:  [id number]    Date/Time: 08/18/2015  9:48 AM          Subjective:     Chief Complaint:  Pain: abdomen, moderate to severe, better with IV pain meds, diffuse, aching    ROS:  (bold if positive, if negative)                Abd Pain                Objective:       Vitals:          Last 24hrs VS reviewed since prior progress note. Most recent are:    Visit Vitals   ??? BP 132/85 (BP 1 Location: Left arm, BP Patient Position: At rest)   ??? Pulse 98   ??? Temp 97.8 ??F (36.6 ??C)   ??? Resp 16   ??? Ht 5\' 9"  (1.753 m)   ??? Wt 115 kg (253 lb 8.5 oz)   ??? SpO2 94%   ??? BMI 37.44 kg/m2     SpO2 Readings from Last 6 Encounters:   08/18/15 94%   10/16/14 98%   09/18/14 97%   05/20/14 97%   05/09/14 99%   05/04/14 96%            Intake/Output Summary (Last 24 hours) at 08/18/15 0948  Last data filed at 08/18/15 [id number]   Gross per 24 hour   Intake             1260 ml   Output              800 ml   Net              460 ml          Exam:     Physical Exam:    Gen:  Well-developed, obese, in no acute distress  HEENT:  Pink conjunctivae, PERRL, hearing intact to voice, moist mucous membranes  Neck:  Supple, without masses, thyroid non-tender  Resp:  No accessory muscle use, clear breath sounds without wheezes rales or rhonchi  Card:  No murmurs, normal S1, S2 without thrills, bruits or peripheral edema, 2+ pedal pulses  Abd:  Soft, diffusely tender, distended without definite fluid wave, normoactive bowel sounds are present, no palpable organomegaly and no detectable hernias  Lymph:  No cervical or inguinal adenopathy  Musc:  No cyanosis or clubbing  Skin:  No rashes or ulcers, skin turgor is good, cap refill <2 sec  Neuro:  Cranial nerves are grossly intact, no focal motor weakness, follows commands appropriately   Psych:  Good insight, oriented to person, place and time, alert    Medications Reviewed: (see below)    Lab Data Reviewed: (see below)    ______________________________________________________________________    Medications:     Current Facility-Administered Medications   Medication Dose Route Frequency   ??? 0.9% sodium chloride infusion  50 mL/hr IntraVENous CONTINUOUS   ??? magnesium sulfate 2 g/50 ml IVPB (premix or compounded)  2 g IntraVENous Q1H   ??? HYDROmorphone (PF) (DILAUDID) injection 0.5 mg  0.5 mg IntraVENous Q3H PRN   ??? sodium chloride (NS) flush 5-10 mL  5-10 mL IntraVENous Q8H   ??? sodium chloride (NS) flush 5-10 mL  5-10 mL IntraVENous PRN   ??? naloxone (NARCAN) injection 0.4 mg  0.4 mg IntraVENous PRN   ??? diphenhydrAMINE (BENADRYL) injection 12.5 mg  12.5 mg IntraVENous Q4H PRN   ??? prochlorperazine (COMPAZINE) injection 5 mg  5 mg IntraVENous Q8H PRN   ??? lactulose (CHRONULAC) solution 20 g  20 g Oral BID   ??? pantoprazole (PROTONIX) 40 mg in sodium chloride 0.9 % 10 mL injection  40 mg IntraVENous Q12H   ??? cefTRIAXone (ROCEPHIN) 1 g in 0.9% sodium chloride (MBP/ADV) 50 mL  1 g IntraVENous Q24H   ??? furosemide (LASIX) tablet 40 mg  40 mg Oral DAILY   ??? spironolactone (ALDACTONE) tablet 50 mg  50 mg Oral DAILY   ??? zolpidem (AMBIEN) tablet 5 mg  5 mg Oral QHS PRN            Lab Review:     Recent Labs      08/18/15   0330  08/17/15   0445  08/16/15   1245   WBC  3.7*  3.8*  3.4*   HGB  8.2*  8.7*  8.9*   HCT  25.4*  26.7*  28.3*   PLT  52*  50*  53*     Recent Labs      08/18/15   0330  08/16/15   1720  08/16/15   1245   NA  136   --   141   K  4.8   --   4.1   CL  103   --   108   CO2  29   --   31   GLU  68   --   154*   BUN  6   --   11   CREA  0.62*   --   0.69*   CA  7.0*   --   6.9*   MG  1.5*   --    --    PHOS  3.5   --    --    ALB   --    --   2.6*   TBILI   --    --   1.0   SGOT   --    --   155*   ALT   --    --   121*   INR   --   1.3*   --      Lab Results   Component Value Date/Time     Glucose (POC) 110 03/12/2014 06:31 PM    Glucose (POC) 202 01/23/2013 02:22 PM    Glucose (POC) 103 01/23/2013 07:45 AM    Glucose (POC) 135 01/22/2013 09:12 PM    Glucose (POC) 141 01/22/2013 03:25 PM    Glucose (POC) 140 01/22/2013 11:22 AM    Glucose, POC 104 05/18/2009 07:19 PM     No results for input(s): PH, PCO2, PO2, HCO3, FIO2 in the last 72 hours.  Recent Labs      08/16/15   1720   INR  1.3*     Lab Results   Component Value Date/Time    Specimen Description: NASAL 03/11/2012 02:55 PM    Specimen Description: STOOL 07/27/2011 02:32 AM     Lab Results   Component Value Date/Time    Culture result: NO GROWTH AFTER 17 HOURS 08/16/2015 04:18 PM    Culture result: NO GROWTH 6 DAYS 02/06/2013 01:23 PM    Culture result: NO GROWTH 6 DAYS 02/06/2013  01:08 PM            Assessment:     Principal Problem:    Abdominal pain, acute, generalized (08/16/2015)    Active Problems:    Hepatitis C infection (12/23/2011)      Pancytopenia (HCC) (11/08/2012)      Melena (01/19/2013)      Varices, 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)      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)      Hyperammonemia (HCC) (09/15/2014)      Hepatic encephalopathy (HCC) (09/16/2014)      Obesity (BMI 30-39.9) (08/17/2015)           Plan:     Principal Problem:    Abdominal pain, acute, generalized (08/16/2015)   - due to ascites, likely mechanical pain, did not improve much with LVP   - cell count NOT c/w infection   - GI input appreciated   - abdominal CT with portal hypertensive changes, minimal ascites   - requiring IV Dilaudid for pain control, continue for now, will need to transition to oral pain medication    Active Problems:    Hepatitis C infection (12/23/2011)/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)   - GI following, needs outpatient referral to Hepatology   - complete alcohol cessation advised      Pancytopenia (HCC) (11/08/2012)   - counts stable   - Hem/Onc input appreciated       Melena (01/19/2013)/Varices, 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)   - Hgb down with hydration     - likely a component of acute GI blood loss as well, potentially life threatening   - s/p EGD with portal hypertensive changes without signs of recent bleeding      Hepatic encephalopathy, acute (HCC) (09/16/2014)/Hyperammonemia (HCC) (09/15/2014)   - ammonia down, continue lactulose      Total time spent with patient: 35 minutes                  Care Plan discussed with: Patient, Care Manager and Nursing Staff    Discussed:  Code Status, Care Plan and D/C Planning    Prophylaxis:  SCD's    Disposition:  Home w/Family           ___________________________________________________    Attending Physician: Marijo Filelay Aaisha Sliter, MD

## 2015-08-18 NOTE — Progress Notes (Signed)
Assumed care of patient from anesthesia. Endoscope was pre-cleaned at bedside immediately following procedure by Tony

## 2015-08-18 NOTE — Other (Signed)
Bedside shift change report given to Stephanie, RN (oncoming nurse) by Chandra R. Sally, RN   (offgoing nurse). Report included the following information SBAR, Kardex and MAR.

## 2015-08-18 NOTE — Procedures (Signed)
[facility]   [facility] [facility]  Amere Iott D. Nunzio CoryLyons, MD  [phone]) [phone]      August 18, 2015    Esophagogastroduodenoscopy (EGD) Procedure Note  Todd CascoRandolph C Wallace  DOB: 04/28/1950  [name]  Medical Record Number: [id number]      Indications:    Cirrhosis with history of varices, due for surveillance exam.  He has had bleeding and is status post EVL 8 weeks ago.  Referring Physician:  Phys Other, MD  Anesthesia/Sedation: Conscious Sedation/Moderate Sedation  Endoscopist:  Dr. Theodoro Parmahristopher Toniesha Zellner  Complications:  None  Estimated Blood Loss:  None    Permit:  The indications, risks, benefits and alternatives were reviewed with the patient or their decision maker who was provided an opportunity to ask questions and all questions were answered.  The specific risks of esophagogastroduodenoscopy with conscious sedation were reviewed, including but not limited to anesthetic complication, bleeding, adverse drug reaction, missed lesion, infection, IV site reactions, and intestinal perforation which would lead to the need for surgical repair.  Alternatives to EGD including radiographic imaging, observation without testing, or laboratory testing were reviewed as well as the limitations of those alternatives discussed.  After considering the options and having all their questions answered, the patient or their decision maker provided both verbal and written consent to proceed.       Procedure in Detail:  After obtaining informed consent, positioning of the patient in the left lateral decubitus position, and conduction of a pre-procedure pause or "time out" the endoscope was introduced into the mouth and advanced to the duodenum.  A careful inspection was made, and findings or interventions are described below.    Findings:   Esophagus:There is sclerosis of the distal esophagus but no varices noted on full insufflation.  Repeat therapy is not required today.   Stomach: Portal hypertensive gastropathy but no gastric varices or ulcers or bleeding.  Duodenum/jejunum: normal      Specimens: none    Impression: Varices are controlled.             Recommendations:  -repeat EGD in 6 months  -No ethanol  -refer to Dr. Larose KellsShiffman for consideration of anti-HCV therapy, for consideration of liver transplant, for ongoing management of liver disease.  -for ascites: 2gm Na restricted diet, and lasix 40mg /spironolactone 100mg  daily with BMP in 1-2 weeks to check lytes and creatinine on this therapy.  -needs to have Hep B surface ANTIBODY checked and if negative immunize against hepatitis B, needs to have hepatitis A TOTAL antibody testing and if this is negative hep A immunization as well.      Thank you for entrusting me with this patient's care.  Please do not hesitate to contact me with any questions or if I can be of assistance with any of your other patients' GI needs.  Signed By: Leta Spellerhristopher D Maryfrances Portugal, MD                        August 18, 2015

## 2015-08-18 NOTE — Progress Notes (Signed)
See anesthesia flow-sheet for procedural sedation and vital signs. No respiratory distress. Abdomen without distention. Stable for transfer to recovery per anesthesia. No specimens obtained

## 2015-08-18 NOTE — Other (Signed)
Evalyn CascoRandolph C Pallo  05/13/1950  [id number]    Situation:  Verbal report received from: Danton Clapenise Doyle, RN  Procedure: Procedure(s):  ESOPHAGOGASTRODUODENOSCOPY (EGD)    Background:    Preoperative diagnosis: cirrhosis  Postoperative diagnosis: * No post-op diagnosis entered *    Operator:  Dr. Nunzio CoryLyons  Assistant(s): Endoscopy Technician-1: Deetta PerlaAntal G Moldovanyi  Endoscopy RN-1: Oretha Milchenise A Doyle, RN    Specimens: * No specimens in log *  H. Pylori  no    Assessment:  Intra-procedure medications   Anesthesia gave intra-procedure sedation and medications, see anesthesia flow sheet yes    Intravenous fluids: NS@ KVO     Vital signs stable     Abdominal assessment: round and soft     Recommendation:  Discharge patient per MD order.  Return to floor  Family or Friend   Permission to share finding with family or friend yes

## 2015-08-18 NOTE — Progress Notes (Signed)
7:18 AM Katha HammingGave Beth, RN in ENDO report using SBAR, sending for patient now. Bedside and Verbal shift change report given to Karren Burlyhandra, Charity fundraiserN (oncoming nurse) by Marisue HumbleMaureen, RN (offgoing nurse). Report included the following information SBAR and Kardex.

## 2015-08-18 NOTE — Procedures (Signed)
Procedures by Leta SpellerLyons, Amsi Grimley D, MD at 08/18/15 [phone]                Author: Leta SpellerLyons, Laquita Harlan D, MD  Service: --  Author Type: Physician       Filed: 08/18/15 0815  Date of Service: 08/18/15 0803  Status: Signed          Editor: Leta SpellerLyons, Bobbyjo Marulanda D, MD (Physician)            Procedures        1. EGD [[id number] (Custom)]                                                           [facility]   [place] [facility]   [name], MD   [phone]) [phone]        August 18, 2015      Esophagogastroduodenoscopy (EGD) Procedure Note   [name]   DOB: 10/26/1950   [name] [place] Medical Record Number: [id number]         Indications:    Cirrhosis with history of varices, due for surveillance exam.  He  has had bleeding and is status post EVL 8 weeks ago.   Referring Physician:  Phys Other, MD   Anesthesia/Sedation: Conscious Sedation/Moderate Sedation   Endoscopist:  Dr. Theodoro Parmahristopher Brennah Quraishi   Complications:  None   Estimated Blood Loss:  None      Permit:   The indications, risks, benefits and alternatives were reviewed with the patient or their decision maker who was provided an opportunity to ask questions  and all questions were answered.  The specific risks of esophagogastroduodenoscopy with conscious sedation were reviewed, including but not limited to anesthetic complication, bleeding, adverse drug reaction, missed lesion, infection, IV site reactions,  and intestinal perforation which would lead to the need for surgical repair.  Alternatives to EGD including radiographic imaging, observation without testing, or laboratory testing were reviewed as well as the limitations of those alternatives discussed.   After considering the options and having all their questions answered, the patient or their decision maker provided both verbal and written consent to proceed.         Procedure in Detail:   After obtaining informed consent, positioning of the patient in the left  lateral decubitus position, and conduction of a pre-procedure pause or "time out" the endoscope was introduced into the mouth and advanced to the duodenum.  A careful inspection  was made, and findings or interventions are described below.      Findings:    Esophagus:There is sclerosis of the distal esophagus but no varices noted on full insufflation.  Repeat therapy is not required today.   Stomach: Portal hypertensive gastropathy but no gastric varices or ulcers or bleeding.   Duodenum/jejunum: normal         Specimens: none      Impression: Varices are controlled.                Recommendations:   -repeat EGD in 6 months   -No ethanol   -refer to Dr. Larose KellsShiffman for consideration of anti-HCV therapy, for consideration of liver transplant, for ongoing management of liver disease.   -for ascites: 2gm Na restricted diet, and lasix 40mg /spironolactone 100mg  daily with BMP in 1-2 weeks  to check lytes and creatinine on this therapy.   -needs to have Hep B surface ANTIBODY checked and if negative immunize against hepatitis B, needs to have hepatitis A TOTAL antibody testing and if this is negative hep A immunization as well.         Thank you for entrusting me with this patient's care.  Please do not hesitate to contact me with any questions or if I can be of assistance with any of your other patients' GI needs.   Signed By: Leta Spellerhristopher D Mateen Franssen, MD                         August 18, 2015

## 2015-08-19 LAB — CBC W/O DIFF
HCT: 25.4 % — ABNORMAL LOW (ref 36.6–50.3)
HGB: 7.9 g/dL — ABNORMAL LOW (ref 12.1–17.0)
MCH: 27.6 PG (ref 26.0–34.0)
MCHC: 31.1 g/dL (ref 30.0–36.5)
MCV: 88.8 FL (ref 80.0–99.0)
PLATELET: 43 10*3/uL — CL (ref 150–400)
RBC: 2.86 M/uL — ABNORMAL LOW (ref 4.10–5.70)
RDW: 17.9 % — ABNORMAL HIGH (ref 11.5–14.5)
WBC: 2.5 10*3/uL — ABNORMAL LOW (ref 4.1–11.1)

## 2015-08-19 LAB — METABOLIC PANEL, BASIC
Anion gap: 2 mmol/L — ABNORMAL LOW (ref 5–15)
BUN/Creatinine ratio: 10 — ABNORMAL LOW (ref 12–20)
BUN: 8 MG/DL (ref 6–20)
CO2: 32 mmol/L (ref 21–32)
Calcium: 7 MG/DL — ABNORMAL LOW (ref 8.5–10.1)
Chloride: 103 mmol/L (ref 97–108)
Creatinine: 0.79 MG/DL (ref 0.70–1.30)
GFR est AA: >60 mL/min/{1.73_m2} (ref >60)
GFR est non-AA: >60 mL/min/{1.73_m2} (ref >60)
Glucose: 128 mg/dL — ABNORMAL HIGH (ref 65–100)
Potassium: 3.4 mmol/L — ABNORMAL LOW (ref 3.5–5.1)
Sodium: 137 mmol/L (ref 136–145)

## 2015-08-19 LAB — HEP B SURFACE AB
Hep B surface Ab Interp.: REACTIVE
Hepatitis B surface Ab: 42.41 m[IU]/mL

## 2015-08-19 LAB — HCV RT-PCR, QUANT (NON-GRAPH)
HCV log10: 6.292 log10 IU/mL
Hepatitis C Quantitation: 1960000 IU/mL

## 2015-08-19 LAB — MAGNESIUM: Magnesium: 1.8 mg/dL (ref 1.6–2.4)

## 2015-08-19 LAB — PHOSPHORUS: Phosphorus: 2.7 MG/DL (ref 2.6–4.7)

## 2015-08-19 LAB — HEPATITIS B SURFACE ANTIBODY
Hep B S Ab: REACTIVE
Hepatitis B Surface Ag: 42.41 m[IU]/mL

## 2015-08-19 MED ORDER — OXYCODONE 5 MG TAB
5 mg | ORAL | Status: DC | PRN
Start: 2015-08-19 — End: 2015-08-20

## 2015-08-19 MED ORDER — OXYCODONE 20 MG/ML ORAL CONCENTRATE
20 mg/mL | ORAL | Status: DC | PRN
Start: 2015-08-19 — End: 2015-08-19

## 2015-08-19 MED ORDER — OXYCODONE 20 MG/ML ORAL CONCENTRATE
20 mg/mL | ORAL | Status: DC | PRN
Start: 2015-08-19 — End: 2015-08-19
  Administered 2015-08-19: 17:00:00 via ORAL

## 2015-08-19 MED ORDER — OXYCODONE 5 MG TAB
5 mg | ORAL | Status: DC | PRN
Start: 2015-08-19 — End: 2015-08-20
  Administered 2015-08-19 – 2015-08-20 (×4): via ORAL

## 2015-08-19 MED ORDER — BUTALBITAL-ACETAMINOPHEN-CAFFEINE 50 MG-325 MG-40 MG TAB
50-325-40 mg | Freq: Once | ORAL | Status: AC
Start: 2015-08-19 — End: 2015-08-19
  Administered 2015-08-19: 12:00:00 via ORAL

## 2015-08-19 MED FILL — LACTULOSE 20 GRAM/30 ML ORAL SOLUTION: 20 gram/30 mL | ORAL | Qty: 30

## 2015-08-19 MED FILL — SPIRONOLACTONE 25 MG TAB: 25 mg | ORAL | Qty: 2

## 2015-08-19 MED FILL — HYDROMORPHONE (PF) 1 MG/ML IJ SOLN: 1 mg/mL | INTRAMUSCULAR | Qty: 1

## 2015-08-19 MED FILL — FUROSEMIDE 40 MG TAB: 40 mg | ORAL | Qty: 1

## 2015-08-19 MED FILL — CEFTRIAXONE 1 GRAM SOLUTION FOR INJECTION: 1 gram | INTRAMUSCULAR | Qty: 1

## 2015-08-19 MED FILL — ZOLPIDEM 5 MG TAB: 5 mg | ORAL | Qty: 1

## 2015-08-19 MED FILL — BUTALBITAL-ACETAMINOPHEN-CAFFEINE 50 MG-325 MG-40 MG TAB: 50-325-40 mg | ORAL | Qty: 1

## 2015-08-19 MED FILL — OXYCODONE 5 MG TAB: 5 mg | ORAL | Qty: 2

## 2015-08-19 MED FILL — OXYCODONE 20 MG/ML ORAL CONCENTRATE: 20 mg/mL | ORAL | Qty: 1

## 2015-08-19 MED FILL — PROTONIX 40 MG INTRAVENOUS SOLUTION: 40 mg | INTRAVENOUS | Qty: 40

## 2015-08-19 NOTE — Progress Notes (Addendum)
[facility]  [address] Leonette Monarch [place], [place] [address]  [phone]      Medical Progress Note      NAME: [name]   DOB:  [birth year]  MRM:  [id number]    Date/Time: 08/19/2015  10:08 AM          Subjective:     Chief Complaint:  Pain: abdomen, moderate to severe, better with IV pain meds but returns quickly, diffuse, aching, no change from admission, no improvement with paracentesis    ROS:  (bold if positive, if negative)                Abd Pain                Objective:       Vitals:          Last 24hrs VS reviewed since prior progress note. Most recent are:    Visit Vitals   ??? BP 139/81 (BP 1 Location: Left arm, BP Patient Position: Sitting)   ??? Pulse 85   ??? Temp 97.9 ??F (36.6 ??C)   ??? Resp 18   ??? Ht  (1.753 m)   ??? Wt 115 kg (253 lb 8.5 oz)   ??? SpO2 93%   ??? BMI 37.44 kg/m2     SpO2 Readings from Last 6 Encounters:   08/19/15 93%   10/16/14 98%   09/18/14 97%   05/20/14 97%   05/09/14 99%   05/04/14 96%            Intake/Output Summary (Last 24 hours) at 08/19/15 1008  Last data filed at 08/18/15 1830   Gross per 24 hour   Intake              610 ml   Output                0 ml   Net              610 ml          Exam:     Physical Exam:    Gen:  Well-developed, obese, in no acute distress  HEENT:  Pink conjunctivae, PERRL, hearing intact to voice, moist mucous membranes  Neck:  Supple, without masses, thyroid non-tender  Resp:  No accessory muscle use, clear breath sounds without wheezes rales or rhonchi  Card:  No murmurs, normal S1, S2 without thrills, bruits or peripheral edema, 2+ pedal pulses  Abd:  Soft, diffusely tender, distended without definite fluid wave, normoactive bowel sounds are present, no palpable organomegaly and no detectable hernias  Lymph:  No cervical or inguinal adenopathy  Musc:  No cyanosis or clubbing  Skin:  No rashes or ulcers, skin turgor is good, cap refill <2 sec  Neuro:  Cranial nerves are grossly intact, no focal motor weakness,  follows commands appropriately  Psych:  Good insight, oriented to person, place and time, alert    Medications Reviewed: (see below)    Lab Data Reviewed: (see below)    ______________________________________________________________________    Medications:     Current Facility-Administered Medications   Medication Dose Route Frequency   ??? HYDROmorphone (PF) (DILAUDID) injection 0.5 mg  0.5 mg IntraVENous Q3H PRN   ??? sodium chloride (NS) flush 5-10 mL  5-10 mL IntraVENous Q8H   ??? sodium chloride (NS) flush 5-10 mL  5-10 mL IntraVENous PRN   ??? naloxone (NARCAN) injection 0.4 mg  0.4 mg  IntraVENous PRN   ??? diphenhydrAMINE (BENADRYL) injection 12.5 mg  12.5 mg IntraVENous Q4H PRN   ??? prochlorperazine (COMPAZINE) injection 5 mg  5 mg IntraVENous Q8H PRN   ??? lactulose (CHRONULAC) solution 20 g  20 g Oral BID   ??? pantoprazole (PROTONIX) 40 mg in sodium chloride 0.9 % 10 mL injection  40 mg IntraVENous Q12H   ??? cefTRIAXone (ROCEPHIN) 1 g in 0.9% sodium chloride (MBP/ADV) 50 mL  1 g IntraVENous Q24H   ??? furosemide (LASIX) tablet 40 mg  40 mg Oral DAILY   ??? spironolactone (ALDACTONE) tablet 50 mg  50 mg Oral DAILY   ??? zolpidem (AMBIEN) tablet 5 mg  5 mg Oral QHS PRN            Lab Review:     Recent Labs      08/19/15   0457  08/18/15   0330  08/17/15   0445   WBC  2.5*  3.7*  3.8*   HGB  7.9*  8.2*  8.7*   HCT  25.4*  25.4*  26.7*   PLT  43*  52*  50*     Recent Labs      08/19/15   0457  08/18/15   0330  08/16/15   1720  08/16/15   1245   NA  137  136   --   141   K  3.4*  4.8   --   4.1   CL  103  103   --   108   CO2  32  29   --   31   GLU  128*  68   --   154*   BUN  8  6   --   11   CREA  0.79  0.62*   --   0.69*   CA  7.0*  7.0*   --   6.9*   MG  1.8  1.5*   --    --    PHOS  2.7  3.5   --    --    ALB   --    --    --   2.6*   TBILI   --    --    --   1.0   SGOT   --    --    --   155*   ALT   --    --    --   121*   INR   --    --   1.3*   --      Lab Results   Component Value Date/Time     Glucose (POC) 110 03/12/2014 06:31 PM    Glucose (POC) 202 01/23/2013 02:22 PM    Glucose (POC) 103 01/23/2013 07:45 AM    Glucose (POC) 135 01/22/2013 09:12 PM    Glucose (POC) 141 01/22/2013 03:25 PM    Glucose (POC) 140 01/22/2013 11:22 AM    Glucose, POC 104 05/18/2009 07:19 PM     No results for input(s): PH, PCO2, PO2, HCO3, FIO2 in the last 72 hours.  Recent Labs      08/16/15   1720   INR  1.3*     Lab Results   Component Value Date/Time    Specimen Description: NASAL 03/11/2012 02:55 PM    Specimen Description: STOOL 07/27/2011 02:32 AM     Lab Results   Component Value Date/Time    Culture result:  NO GROWTH 2 DAYS 08/16/2015 04:18 PM    Culture result: NO GROWTH 6 DAYS 02/06/2013 01:23 PM    Culture result: NO GROWTH 6 DAYS 02/06/2013 01:08 PM            Assessment:     Principal Problem:    Abdominal pain, acute, generalized (08/16/2015)    Active Problems:    Hepatitis C infection (12/23/2011)      Pancytopenia (HCC) (11/08/2012)      Melena (01/19/2013)      Varices, 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)      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)      Hyperammonemia (HCC) (09/15/2014)      Hepatic encephalopathy (HCC) (09/16/2014)      Obesity (BMI 30-39.9) (08/17/2015)           Plan:     Principal Problem:    Abdominal pain, acute, generalized (08/16/2015)   - does not appear to be due to ascites primarily   - he admits to chronic pain which he usually "just suffers with"   - he is requiring frequent dosing with IV Dilaudid here in the hospital   - this did not change after LVP   - consult Palliative Care for pain management, I am seriously concerned about the addictive potential of long term opiates in this patient   - if his pain issues were controlled, he could be discharged   - he is also requesting sleep medication, I am very hesitant to prescribe anything else with abuse potential    Active Problems:    Hepatitis C infection (12/23/2011)/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)    - GI following, needs outpatient referral to Hepatology   - complete alcohol cessation advised      Pancytopenia (HCC) (11/08/2012)   - counts stable   - Hem/Onc input appreciated      Melena (01/19/2013)/Varices, 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)   - Hgb down with hydration     - likely a component of acute GI blood loss as well, potentially life threatening   - s/p EGD with portal hypertensive changes without signs of recent bleeding      Hepatic encephalopathy, acute (HCC) (09/16/2014)/Hyperammonemia (HCC) (09/15/2014)   - ammonia down, continue lactulose      Total time spent with patient: 35 minutes                  Care Plan discussed with: Patient, Care Manager and Nursing Staff    Discussed:  Code Status, Care Plan and D/C Planning    Prophylaxis:  SCD's    Disposition:  Home w/Family           ___________________________________________________    Attending Physician: Marijo Filelay Kyion Gautier, MD

## 2015-08-19 NOTE — Progress Notes (Signed)
Bedside and Verbal shift change report given to Will, RN (oncoming nurse) by Steph, RN (offgoing nurse). Report included the following information SBAR, Kardex, Intake/Output, MAR, Recent Results and Med Rec Status.

## 2015-08-19 NOTE — Progress Notes (Signed)
NUTRITION education       Nutrition Assessment: Pt appetite is close to normal, eating 75-100%. Complaining of abdominal pain, states his hernia is hurting. Bowels moving regularly. LBM 6/27. States he's been eating "low salt" for 15 yrs. Pt is a truck driver, discussed how fast food and road food can be very salty even if it doesn't taste "salty." Pt didn't seem overly interested in changing food habits.      Nutrition Diagnoses:  Nutrition/food related knowledge.     Intervention:   1. Brief Nutrition education (E - 1.2) including identification of high sodium foods, reading food labels and replacing high sodium choices with low sodium foods.  Left Low Na nutrition manual information with pt.     2. Nutrition Counseling (C-2) including strategies for behavior modification, goal setting and planning    Monitoring/Evaluation: Pt expressed understanding of education topics and acknowledged ability in applying diet goals. Pt answered questions posed to demonstrate learning.     Jannet MantisWesley A Nyeli Holtmeyer

## 2015-08-19 NOTE — Progress Notes (Addendum)
08/19/15     MSW met with the patient. Addressed confirmed. The house is a two story with 3 steps to enter and bedroom on 2nd floor. Patient reports that he either stays with his brother's at this address or with his girlfriend. He does work so has been independent. He uses no medical equipment and never had HH. He states he stopped drinking and reads his bible to help him. He refused information on AA. He uses [facility] on [address]. His family or girlfriend can transport him home.    Care Management Interventions  PCP Verified by CM: Yes (He cannot remember the name but does hae a PCP)  Palliative Care Consult (Criteria: CHF and RRAT>21): No  Transition of Care Consult (CM Consult): Discharge Planning  Discharge Durable Medical Equipment: No  Physical Therapy Consult: Yes  Occupational Therapy Consult: Yes  Current Support Network: Other (either stays with brother or girlfriend)  Confirm Follow Up Transport: Family  Plan discussed with Pt/Family/Caregiver: Yes  Discharge Location  Discharge Placement: Home     Cleopatra Cedar, MSW  Case Manager

## 2015-08-19 NOTE — Other (Signed)
Bedside and Verbal shift change report given to Stephanie RN (oncoming nurse) by Will RN (offgoing nurse). Report included the following information SBAR, Kardex, Intake/Output and MAR.

## 2015-08-19 NOTE — Progress Notes (Signed)
[facility] [place]  [phone]    Hematology / Oncology Follow up    Reason for Visit:   [name] is a 65 y.o. male who is seen in consultation at the request of Dr. Odette Horns for evaluation of pancytopenia.    History of Present Illness:     Mr [name] was admitted on 08/16/2015 from the ED when he presented with c/o abd discomfort. Hemoccult in ED positive . Hx of cirrhosis with ascites drained (700 ml and drain remains in ) Therefore he was admitted for further eval and management.     Mr [name] is a poor historian   Unable to state if he has been told in the past he has any problems with his Hgb or white cell count. 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   Abd pain has gotten better with drainage but continues to be very sore and does hurt where the pain is inserted. Denies SOB at present. Denies N/V.   States ETOH varies; only drank 1/2 beer last week.       No family at bedside.       Interval History:   Sitting up at bedside; states pain to abd is better. No further complaints at present.     No family at bedside.     Current Facility-Administered Medications   Medication Dose Route Frequency   ??? oxyCODONE (ROXICODONE INTENSOL) 20 mg/mL concentrated solution 5 mg  5 mg Oral Q4H PRN   ??? oxyCODONE (ROXICODONE INTENSOL) 20 mg/mL concentrated solution 10 mg  10 mg Oral Q4H PRN   ??? sodium chloride (NS) flush 5-10 mL  5-10 mL IntraVENous Q8H   ??? sodium chloride (NS) flush 5-10 mL  5-10 mL IntraVENous PRN   ??? naloxone (NARCAN) injection 0.4 mg  0.4 mg IntraVENous PRN   ??? diphenhydrAMINE (BENADRYL) injection 12.5 mg  12.5 mg IntraVENous Q4H PRN   ??? prochlorperazine (COMPAZINE) injection 5 mg  5 mg IntraVENous Q8H PRN   ??? lactulose (CHRONULAC) solution 20 g  20 g Oral BID    ??? pantoprazole (PROTONIX) 40 mg in sodium chloride 0.9 % 10 mL injection  40 mg IntraVENous Q12H   ??? cefTRIAXone (ROCEPHIN) 1 g in 0.9% sodium chloride (MBP/ADV) 50 mL  1 g IntraVENous Q24H   ??? furosemide (LASIX) tablet 40 mg  40 mg Oral DAILY   ??? spironolactone (ALDACTONE) tablet 50 mg  50 mg Oral DAILY   ??? zolpidem (AMBIEN) tablet 5 mg  5 mg Oral QHS PRN      No Known Allergies     Review of Systems: A complete review of systems was obtained, negative except as described above.      Physical Exam:     Visit Vitals   ??? BP 104/71 (BP 1 Location: Left arm, BP Patient Position: At rest)   ??? Pulse (!) 55   ??? Temp 98 ??F (36.7 ??C)   ??? Resp 16   ??? Ht 5' 9"  (1.753 m)   ??? Wt 115 kg (253 lb 8.5 oz)   ??? SpO2 100%   ??? BMI 37.44 kg/m2     ECOG PS: 2  General: No distress  Eyes: anicteric sclerae  HENT: Atraumatic, OP clear  Neck: Supple  Respiratory:  normal respiratory effort  CV: Normal rate, regular rhythm, no murmurs, no peripheral edema  GI: distended ; percutaneous drainage tube in place; Soft, nontender no masses, no hepatomegaly, no splenomegaly  Skin: No rashes, ecchymoses, or petechiae. Normal temperature, turgor, and texture.  Psych: Alert, oriented, appropriate affect, normal judgment/insight  Neuro: CN II-XII intact    Results:     Lab Results   Component Value Date/Time    WBC 2.5 08/19/2015 04:57 AM    HGB 7.9 08/19/2015 04:57 AM    HCT 25.4 08/19/2015 04:57 AM    PLATELET 43 08/19/2015 04:57 AM    MCV 88.8 08/19/2015 04:57 AM    ABS. NEUTROPHILS 2.2 08/17/2015 04:45 AM    Hemoglobin, POC 15.6 05/18/2009 07:19 PM    Hematocrit, POC 46 05/18/2009 07:19 PM     Lab Results   Component Value Date/Time    Sodium 137 08/19/2015 04:57 AM    Potassium 3.4 08/19/2015 04:57 AM    Chloride 103 08/19/2015 04:57 AM    CO2 32 08/19/2015 04:57 AM    Glucose 128 08/19/2015 04:57 AM    BUN 8 08/19/2015 04:57 AM    Creatinine 0.79 08/19/2015 04:57 AM    GFR est AA >60 08/19/2015 04:57 AM    GFR est non-AA >60 08/19/2015 04:57 AM     Calcium 7.0 08/19/2015 04:57 AM    Sodium, POC 140 05/18/2009 07:19 PM    Potassium, POC 4.2 05/18/2009 07:19 PM    Chloride, POC 102 05/18/2009 07:19 PM    Glucose (POC) 110 03/12/2014 06:31 PM    Creatinine, POC 0.8 05/18/2009 07:19 PM    Calcium, ionized (POC) 1.13 05/18/2009 07:19 PM     Lab Results   Component Value Date/Time    Bilirubin, total 1.0 08/16/2015 12:45 PM    ALT (SGPT) 121 08/16/2015 12:45 PM    AST (SGOT) 155 08/16/2015 12:45 PM    Alk. phosphatase 115 08/16/2015 12:45 PM    Protein, total 6.2 08/16/2015 12:45 PM    Albumin 2.6 08/16/2015 12:45 PM    Globulin 3.6 08/16/2015 12:45 PM     Lab Results   Component Value Date/Time    Reticulocyte count 4.2 11/08/2012 11:25 PM    Iron % saturation 19 01/20/2013 02:00 AM    TIBC 290 01/20/2013 02:00 AM    Ferritin 21 01/20/2013 02:00 AM    Vitamin B12 520 01/20/2013 02:00 AM    Folate 13.9 01/20/2013 02:00 AM    Folate, RBC 1906 01/20/2013 02:00 AM    Homocysteine, plasma 8.9 11/08/2012 11:25 PM    Sed rate, automated 15 01/20/2013 02:00 AM    C-Reactive protein <0.3 01/20/2013 02:00 AM    TSH 0.84 01/20/2013 02:00 AM    Lipase 222 08/16/2015 12:45 PM    Hep C  virus Ab Interp. POSITIVE 07/28/2011 03:02 AM    Hepatitis C virus Ab REACTIVE 03/06/2014 06:45 AM     Lab Results   Component Value Date/Time    INR 1.3 08/16/2015 05:20 PM    aPTT 28.3 08/16/2015 05:20 PM    aPTT 31.0 10/16/2014 02:25 PM    D DIMER 3.13 04/27/2010 03:45 PM     08/16/2015 XR CHEST   IMPRESSION  Impression: No acute process.    08/17/2015 CT ABD   IMPRESSION: Cirrhosis with portal hypertensive sequela including splenomegaly  and periesophageal varices, and there is within fat-containing umbilical hernia.  Small residual ascites status post right peritoneal drainage catheter placement.  ??  NOTE: Single phase exam may not be sensitive for hepatocellular carcinoma and if  there is elevated clinical suspicion, consider dedicated liver MRI or  three-phase liver CT.     08/18/2015 EGD Ottis Stain)  Findings:   Esophagus:There is sclerosis of the distal esophagus but no varices noted on full insufflation.  Repeat therapy is not required today.  Stomach: Portal hypertensive gastropathy but no gastric varices or ulcers or bleeding.  Duodenum/jejunum: normal    Assessment and Recommendations:   1. Pancytopenia  appears to be chronic for several years  Most likely secondary to cirrhosis of liver  Counts stable      2. Ascites/hepatitis C infection  Secondary to cirrhosis  S/p/ paracentesis  EGD 6/27;   abx coverage  GI following; recommending referral to [place] / [facility]atology [facility])     3. SOB  Improved  CXR unremarkable    4. Abd pain  Secondary to cirrhosis/ascites  Continue to monitor  Palliative team consulted    5. Hemoccult stool  Positive   GI following; s/p EGD     We will sign off at this time but are available for further questions or assistance if needed.     The above exam and treatment plan were reviewed with Dr Laurena Bering.    Signed By: Lorelle Gibbs, NP     August 19, 2015

## 2015-08-19 NOTE — Progress Notes (Addendum)
Gastroenterology Progress Note    August 19, 2015  Admit Date: 08/16/2015         Narrative Assessment and Plan   ?? Cirrhosis  ?? HCV  ?? Etoh abuse  ?? Ascites s/p paracentesis  ?? Recent EGD showed controlled varices no active bleeding    Plan  - repeat EGD in 6 months  - hep A/B and HCV viral load still pending  - continue with low sodium diet and lasix/spironolactone for ascites management  - again discussed with patient importance of completely abstaining from alcohol    -----  From GI perspective he can be discharged.  His abdominal pain is not an indication for chronic opioids, and I will not refill that were he to be discharged with that type of Rx.    I will arrange follow up varices screening and will ask my office to arrange appointment in [facility]ology[facility] office.  If we need to see again, let me know but for now I am signing off.  Leta Spellerhristopher D Evert Wenrich, MD     Subjective:   ?? Doing relatively well. Still complains of some intermittemt, sharp lower abdominal pain. Not improved after having paracentesis. States it has been present for some time. Attributes it to his hernia but "can't get anybody to fix it". Denies nausea or vomiting.     ROS:  The previous review of systems on initial consultation / H&P is noted and reviewed.  Specific changes noted above in HPI.    Current Medications:     Current Facility-Administered Medications   Medication Dose Route Frequency   ??? HYDROmorphone (PF) (DILAUDID) injection 0.5 mg  0.5 mg IntraVENous Q3H PRN   ??? sodium chloride (NS) flush 5-10 mL  5-10 mL IntraVENous Q8H   ??? sodium chloride (NS) flush 5-10 mL  5-10 mL IntraVENous PRN   ??? naloxone (NARCAN) injection 0.4 mg  0.4 mg IntraVENous PRN   ??? diphenhydrAMINE (BENADRYL) injection 12.5 mg  12.5 mg IntraVENous Q4H PRN   ??? prochlorperazine (COMPAZINE) injection 5 mg  5 mg IntraVENous Q8H PRN   ??? lactulose (CHRONULAC) solution 20 g  20 g Oral BID    ??? pantoprazole (PROTONIX) 40 mg in sodium chloride 0.9 % 10 mL injection  40 mg IntraVENous Q12H   ??? cefTRIAXone (ROCEPHIN) 1 g in 0.9% sodium chloride (MBP/ADV) 50 mL  1 g IntraVENous Q24H   ??? furosemide (LASIX) tablet 40 mg  40 mg Oral DAILY   ??? spironolactone (ALDACTONE) tablet 50 mg  50 mg Oral DAILY   ??? zolpidem (AMBIEN) tablet 5 mg  5 mg Oral QHS PRN       Objective:     VITALS:   Last 24hrs VS reviewed since prior progress note. Most recent are:  Visit Vitals   ??? BP 139/81 (BP 1 Location: Left arm, BP Patient Position: Sitting)   ??? Pulse 85   ??? Temp 97.9 ??F (36.6 ??C)   ??? Resp 18   ??? Ht 5\' 9"  (1.753 m)   ??? Wt 115 kg (253 lb 8.5 oz)   ??? SpO2 93%   ??? BMI 37.44 kg/m2     Temp (24hrs), Avg:98.1 ??F (36.7 ??C), Min:97.9 ??F (36.6 ??C), Max:98.3 ??F (36.8 ??C)      Intake/Output Summary (Last 24 hours) at 08/19/15 1132  Last data filed at 08/18/15 1830   Gross per 24 hour   Intake  610 ml   Output                0 ml   Net              610 ml       EXAM:  General: Comfortable, no distress????  HEENT: Sclera anicteric  Abdomen: Soft, nontender, umbilical hernia noted - soft, easily reducible.  No mass, guarding or rebound  Ext:                  No edema    Lab Data Reviewed:   Recent Labs      08/19/15   0457  08/18/15   0330  08/17/15   0445   WBC  2.5*  3.7*  3.8*   HGB  7.9*  8.2*  8.7*   HCT  25.4*  25.4*  26.7*   PLT  43*  52*  50*     Recent Labs      08/19/15   0457  08/18/15   0330  08/16/15   1720  08/16/15   1245   NA  137  136   --   141   K  3.4*  4.8   --   4.1   CL  103  103   --   108   CO2  32  29   --   31   GLU  128*  68   --   154*   BUN  8  6   --   11   CREA  0.79  0.62*   --   0.69*   CA  7.0*  7.0*   --   6.9*   MG  1.8  1.5*   --    --    PHOS  2.7  3.5   --    --    ALB   --    --    --   2.6*   TBILI   --    --    --   1.0   SGOT   --    --    --   155*   ALT   --    --    --   121*   INR   --    --   1.3*   --      Lab Results   Component Value Date/Time     Glucose (POC) 110 03/12/2014 06:31 PM    Glucose (POC) 202 01/23/2013 02:22 PM    Glucose (POC) 103 01/23/2013 07:45 AM    Glucose (POC) 135 01/22/2013 09:12 PM    Glucose (POC) 141 01/22/2013 03:25 PM    Glucose (POC) 140 01/22/2013 11:22 AM    Glucose, POC 104 05/18/2009 07:19 PM     No results for input(s): PH, PCO2, PO2, HCO3, FIO2 in the last 72 hours.  

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
## 2015-08-20 LAB — CULTURE, BODY FLUID W GRAM STAIN
Culture result:: NO GROWTH
GRAM STAIN: NONE SEEN

## 2015-08-20 LAB — HEP A AB, TOTAL: Hep A Ab, total: POSITIVE — AB

## 2015-08-20 MED ORDER — ZOLPIDEM 5 MG TAB
5 mg | ORAL_TABLET | Freq: Every evening | ORAL | 0 refills | Status: AC | PRN
Start: 2015-08-20 — End: ?

## 2015-08-20 MED ORDER — OXYCODONE 10 MG TAB
10 mg | ORAL_TABLET | ORAL | 0 refills | Status: AC | PRN
Start: 2015-08-20 — End: ?

## 2015-08-20 MED FILL — LACTULOSE 20 GRAM/30 ML ORAL SOLUTION: 20 gram/30 mL | ORAL | Qty: 30

## 2015-08-20 MED FILL — OXYCODONE 5 MG TAB: 5 mg | ORAL | Qty: 2

## 2015-08-20 MED FILL — PROTONIX 40 MG INTRAVENOUS SOLUTION: 40 mg | INTRAVENOUS | Qty: 40

## 2015-08-20 MED FILL — SPIRONOLACTONE 25 MG TAB: 25 mg | ORAL | Qty: 2

## 2015-08-20 MED FILL — FUROSEMIDE 40 MG TAB: 40 mg | ORAL | Qty: 1

## 2015-08-20 MED FILL — ZOLPIDEM 5 MG TAB: 5 mg | ORAL | Qty: 1

## 2015-08-20 NOTE — Progress Notes (Signed)
[facility]  [address], [place], [place] [id number]  [phone]      Medical Progress Note      NAME: Todd CascoRandolph C Wallace   DOB:  04/16/1950  MRM:  [id number]    Date/Time: 08/20/2015  10:37 AM          Subjective:     Chief Complaint:  Pain: abdomen, states it is much better with PO pain meds and is ready to go    ROS:  (bold if positive, if negative)                Abd Pain                Objective:       Vitals:          Last 24hrs VS reviewed since prior progress note. Most recent are:    Visit Vitals   ??? BP 124/84 (BP 1 Location: Left arm, BP Patient Position: Sitting)   ??? Pulse 65   ??? Temp 97.6 ??F (36.4 ??C)   ??? Resp 16   ??? Ht 5\' 9"  (1.753 m)   ??? Wt 115 kg (253 lb 8.5 oz)   ??? SpO2 96%   ??? BMI 37.44 kg/m2     SpO2 Readings from Last 6 Encounters:   08/20/15 96%   10/16/14 98%   09/18/14 97%   05/20/14 97%   05/09/14 99%   05/04/14 96%            Intake/Output Summary (Last 24 hours) at 08/20/15 1037  Last data filed at 08/19/15 2018   Gross per 24 hour   Intake                0 ml   Output              450 ml   Net             -450 ml          Exam:     Physical Exam:    Gen:  Well-developed, obese, in no acute distress  HEENT:  Pink conjunctivae, PERRL, hearing intact to voice, moist mucous membranes  Neck:  Supple, without masses, thyroid non-tender  Resp:  No accessory muscle use, clear breath sounds without wheezes rales or rhonchi  Card:  No murmurs, normal S1, S2 without thrills, bruits or peripheral edema, 2+ pedal pulses  Abd:  Soft, diffusely tender, distended without definite fluid wave, normoactive bowel sounds are present, no palpable organomegaly and no detectable hernias  Lymph:  No cervical or inguinal adenopathy  Musc:  No cyanosis or clubbing  Skin:  No rashes or ulcers, skin turgor is good, cap refill <2 sec  Neuro:  Cranial nerves are grossly intact, no focal motor weakness, follows commands appropriately   Psych:  Good insight, oriented to person, place and time, alert    Medications Reviewed: (see below)    Lab Data Reviewed: (see below)    ______________________________________________________________________    Medications:     Current Facility-Administered Medications   Medication Dose Route Frequency   ??? oxyCODONE IR (ROXICODONE) tablet 5 mg  5 mg Oral Q4H PRN   ??? oxyCODONE IR (ROXICODONE) tablet 10 mg  10 mg Oral Q4H PRN   ??? sodium chloride (NS) flush 5-10 mL  5-10 mL IntraVENous Q8H   ??? sodium chloride (NS) flush 5-10 mL  5-10 mL IntraVENous PRN   ??? naloxone (  NARCAN) injection 0.4 mg  0.4 mg IntraVENous PRN   ??? diphenhydrAMINE (BENADRYL) injection 12.5 mg  12.5 mg IntraVENous Q4H PRN   ??? prochlorperazine (COMPAZINE) injection 5 mg  5 mg IntraVENous Q8H PRN   ??? lactulose (CHRONULAC) solution 20 g  20 g Oral BID   ??? pantoprazole (PROTONIX) 40 mg in sodium chloride 0.9 % 10 mL injection  40 mg IntraVENous Q12H   ??? cefTRIAXone (ROCEPHIN) 1 g in 0.9% sodium chloride (MBP/ADV) 50 mL  1 g IntraVENous Q24H   ??? furosemide (LASIX) tablet 40 mg  40 mg Oral DAILY   ??? spironolactone (ALDACTONE) tablet 50 mg  50 mg Oral DAILY   ??? zolpidem (AMBIEN) tablet 5 mg  5 mg Oral QHS PRN            Lab Review:     Recent Labs      08/19/15   0457  08/18/15   0330   WBC  2.5*  3.7*   HGB  7.9*  8.2*   HCT  25.4*  25.4*   PLT  43*  52*     Recent Labs      08/19/15   0457  08/18/15   0330   NA  137  136   K  3.4*  4.8   CL  103  103   CO2  32  29   GLU  128*  68   BUN  8  6   CREA  0.79  0.62*   CA  7.0*  7.0*   MG  1.8  1.5*   PHOS  2.7  3.5     Lab Results   Component Value Date/Time    Glucose (POC) 110 03/12/2014 06:31 PM    Glucose (POC) 202 01/23/2013 02:22 PM    Glucose (POC) 103 01/23/2013 07:45 AM    Glucose (POC) 135 01/22/2013 09:12 PM    Glucose (POC) 141 01/22/2013 03:25 PM    Glucose (POC) 140 01/22/2013 11:22 AM    Glucose, POC 104 05/18/2009 07:19 PM      No results for input(s): PH, PCO2, PO2, HCO3, FIO2 in the last 72 hours.  No results for input(s): INR in the last 72 hours.    No lab exists for component: Rennie NatterINREXT, INREXT  Lab Results   Component Value Date/Time    Specimen Description: NASAL 03/11/2012 02:55 PM    Specimen Description: STOOL 07/27/2011 02:32 AM     Lab Results   Component Value Date/Time    Culture result: NO GROWTH 4 DAYS 08/16/2015 04:18 PM    Culture result: NO GROWTH 6 DAYS 02/06/2013 01:23 PM    Culture result: NO GROWTH 6 DAYS 02/06/2013 01:08 PM            Assessment:     Principal Problem:    Abdominal pain, acute, generalized (08/16/2015)    Active Problems:    Hepatitis C infection (12/23/2011)      Pancytopenia (HCC) (11/08/2012)      Melena (01/19/2013)      Varices, 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)      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)      Hyperammonemia (HCC) (09/15/2014)      Hepatic encephalopathy (HCC) (09/16/2014)      Obesity (BMI 30-39.9) (08/17/2015)           Plan:     Principal Problem:    Abdominal pain, acute, generalized (08/16/2015)   - agree with GI this does  not require chronic pain meds   - doing well on OxyIR, home today on oral meds    Active Problems:    Hepatitis C infection (12/23/2011)/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)   - GI following, needs outpatient referral to Hepatology   - complete alcohol cessation advised      Pancytopenia (HCC) (11/08/2012)   - counts stable   - Hem/Onc input appreciated      Melena (01/19/2013)/Varices, 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)   - Hgb down with hydration     - likely a component of acute GI blood loss as well, potentially life threatening   - s/p EGD with portal hypertensive changes without signs of recent bleeding      Hepatic encephalopathy, acute (HCC) (09/16/2014)/Hyperammonemia (HCC) (09/15/2014)   - ammonia down, continue lactulose      Total time spent with patient: 35 minutes                   Care Plan discussed with: Patient, Care Manager and Nursing Staff    Discussed:  Code Status, Care Plan and D/C Planning    Prophylaxis:  SCD's    Disposition:  Home w/Family           ___________________________________________________    Attending Physician: Marijo File, MD

## 2015-08-20 NOTE — Other (Addendum)
Left message with patient's cell phone regarding Lactulose prescription ([phone]). Attempted to call Courtney ParisRenata (patient's girlfriend), and answering machine said she will not be in town until next Monday. Notified by MD to call patient to inform him he can swing by and pick up prescription or we can call it into a pharmacy for him. Will await to hear back from patient.     1542: Attempted to call patient again and inform him of lactulose prescription, and was sent straight to voicemail.     1746: Left message on patient's cell phone stating that prescription is with unit secretary, and he may pick up at his earliest convince since unable to reach him.

## 2015-08-20 NOTE — Other (Signed)
Discharge instructions reviewed withPatient. Patient verbalized understanding of instructions. Opportunities for questions were provided and explained. Patient discharged Home. Discharge medications reviewed with patient and appropriate educational materials and side effects teaching were provided. Patient signed paper copy due to technical error.

## 2015-08-20 NOTE — Discharge Summary (Addendum)
Physician Discharge Summary     Patient ID:  [name]  [id number]  64 y.o.  08/25/1950    Admit date: 08/16/2015    Discharge date: 08/20/2015    Admission Diagnoses: Abdominal pain, acute, generalized  cirrhosis    Discharge Diagnoses:  Principal Diagnosis Abdominal pain, acute, generalized                                            Principal Problem:    Abdominal pain, acute, generalized (08/16/2015)    Active Problems:    Hepatitis C infection (12/23/2011)      Pancytopenia ([facility]) (11/08/2012)      Melena (01/19/2013)      Varices, 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)      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)      Hyperammonemia ([facility]CC) (09/15/2014)      Hepatic encephalopathy ([facility]CC) (09/16/2014)      Obesity (BMI 30-39.9) (08/17/2015)         Resolved Problems:  Problem List as of 08/20/2015  Date Reviewed: 05/19/2014          Codes Class Noted - Resolved    Obesity (BMI 30-39.9) (Chronic) ICD-10-CM: E66.9  ICD-9-CM: 278.00  08/17/2015 - Present        * (Principal)Abdominal pain, acute, generalized ICD-10-CM: R10.84  ICD-9-CM: 789.07, 338.19  08/16/2015 - Present        Hepatic encephalopathy (HCC) ICD-10-CM: K72.90  ICD-9-CM: 572.2  09/16/2014 - Present        Hyperammonemia (HCC) ICD-10-CM: E72.20  ICD-9-CM: 270.6  09/15/2014 - Present        Cirrhosis of liver with ascites (HCC) (Chronic) ICD-10-CM: K74.60  ICD-9-CM: 571.5  05/09/2014 - Present        Varices, esophageal (HCC) (Chronic) ICD-10-CM: I85.00  ICD-9-CM: 456.1  03/25/2013 - Present        Acute upper GI bleeding ICD-10-CM: K92.2  ICD-9-CM: 578.9  01/20/2013 - Present        Hematemesis ICD-10-CM: K92.0  ICD-9-CM: 578.0  01/19/2013 - Present        Melena ICD-10-CM: K92.1  ICD-9-CM: 578.1  01/19/2013 - Present        Thrombocytopenia (HCC) ICD-10-CM: D69.6  ICD-9-CM: 287.5  01/19/2013 - Present        Alcoholic cirrhosis (HCC) ICD-10-CM: K70.30  ICD-9-CM: 571.2  01/19/2013 - Present        Chest pain ICD-10-CM: R07.9   ICD-9-CM: 786.50  01/19/2013 - Present        Pancytopenia (HCC) (Chronic) ICD-10-CM: O13.08661.818  ICD-9-CM: 284.19  11/08/2012 - Present        Alcohol abuse ICD-10-CM: F10.10  ICD-9-CM: 305.00  03/11/2012 - Present        Thrombocytopenia, unspecified (HCC) ICD-10-CM: D69.6  ICD-9-CM: 287.5  03/10/2012 - Present        Hepatitis C infection (Chronic) ICD-10-CM: B19.20  ICD-9-CM: 070.70  12/23/2011 - Present        Alcoholic cirrhosis of liver (HCC) (Chronic) ICD-10-CM: K70.30  ICD-9-CM: 571.2  07/26/2011 - Present        RESOLVED: SOB (shortness of breath) ICD-10-CM: R06.02  ICD-9-CM: 786.05  08/16/2015 - 08/17/2015        RESOLVED: GI bleed ICD-10-CM: K92.2  ICD-9-CM: 578.9  01/20/2013 - 05/20/2014        RESOLVED: Drug-seeking behavior ICD-10-CM: Z76.5  ICD-9-CM: 305.90  03/11/2012 -  03/14/2012        RESOLVED: Upper GI bleed ICD-10-CM: K92.2  ICD-9-CM: 578.9  03/10/2012 - 03/14/2012        RESOLVED: Acute blood loss anemia ICD-10-CM: D62  ICD-9-CM: 285.1  03/10/2012 - 03/14/2012        RESOLVED: Acute encephalopathy ICD-10-CM: G93.40  ICD-9-CM: 348.30  03/10/2012 - 03/11/2012        RESOLVED: Other and unspecified noninfectious gastroenteritis and colitis ICD-10-CM: K52.9  ICD-9-CM: 558.9  07/26/2011 - 03/11/2012                Hospital Course:   [name] was admitted to the Hospitalist Service on the 5th floor for treatment of abdominal pain.  He was empirically started on IV antibiotics for possible SBP.  He underwent LVP which showed no signs of infection.  He did report recent UGI bleeding and so he was continued on antibiotics for possible variceal bleeding as well as acid suppression.  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  Despite removal of almost all of his ascites, he continued to have severe pain requiring IV Dilaudid for pain control.  He was counseled on compliance with his home regimen as  it appeared he was non-compliant prior to admission.  He was discharged home on 08/20/2015 in improved condition.        PCP: Phys Other, MD    Consults: GI and Palliative Care    Discharge Exam:  See my Progress Note from today.    Disposition: home    Patient Instructions:   A lactulose prescription was inadvertently missed at the time of discharge and was provided to the patient after discharge.    Discharge Medication List as of 08/20/2015 10:21 AM      START taking these medications    Details   oxyCODONE IR (ROXICODONE) 10 mg tab immediate release tablet Take 1 Tab by mouth every four (4) hours as needed. Max Daily Amount: 60 mg., Print, Disp-42 Tab, R-0      zolpidem (AMBIEN) 5 mg tablet Take 1 Tab by mouth nightly as needed for Sleep. Max Daily Amount: 5 mg., Print, Disp-7 Tab, R-0         CONTINUE these medications which have NOT CHANGED    Details   furosemide (LASIX) 40 mg tablet Take 40 mg by mouth daily., Historical Med      spironolactone (ALDACTONE) 50 mg tablet Take 50 mg by mouth daily., Historical Med      cyanocobalamin 1,000 mcg tablet Take 1,000 mcg by mouth daily., Historical Med      bismuth subsalicylate (PEPTO-BISMOL) 262 mg/15 mL suspension Take 30 mL by mouth daily as needed for Indigestion., Historical Med      ibuprofen (MOTRIN) 200 mg tablet Take 200 mg by mouth daily as needed for Pain., Historical Med      ferrous sulfate 325 mg (65 mg iron) tablet Take 1 Tab by mouth Daily (before breakfast)., Print, Disp-30 Tab, R-1      multivitamin capsule Take 1 Cap by mouth daily., Print, Disp-30 Cap, R-1            Activity: Activity as tolerated  Diet: Cardiac Diet  Wound Care: None needed    Follow-up Information     Follow up With Details Comments Contact Info    Leta Speller, MD  Dr. Nunzio Cory' office will contact you with follow up [address]  [address]  [place] [id number]  [phone]      Phys Other, MD   Patient can only remember the practice name and not the physician             35 minutes were spend on this discharge.    Signed:  Marijo Filelay Venora Kautzman, MD  08/20/2015  12:00 PM

## 2015-08-20 NOTE — Progress Notes (Signed)
Bedside and Verbal shift change report given to Will, RN (oncoming nurse) by Steph, RN (offgoing nurse). Report included the following information SBAR, Kardex, Intake/Output, MAR, Recent Results and Med Rec Status.

## 2015-08-20 NOTE — Progress Notes (Signed)
08/20/2015  10:57 AM  Spoke w/ MD Verlin FesterBeveridge, Pt is stable for D/C today, no CM needs.  Jacinta ShoeJeanmarie Young  Case Management

## 2015-08-20 NOTE — Discharge Summary (Signed)
Discharge Summary by Marijo File, MD at 08/20/15 1149                Author: Marijo File, MD  Service: Hospitalist  Author Type: Physician       Filed: 08/20/15 1214  Date of Service: 08/20/15 1149  Status: Addendum          Editor: Marijo File, MD (Physician)          Related Notes: Original Note by Marijo File, MD (Physician) filed at 08/20/15 1203                         Physician Discharge Summary        Patient ID:   [name]   [id number]   65 y.o.   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)        Hyperammonemia (HCC) (09/15/2014)        Hepatic encephalopathy (HCC) (09/16/2014)        Obesity (BMI 30-39.9) (08/17/2015)             Resolved Problems:      Problem List as of 08/20/2015   Date Reviewed:  2014-06-14                Codes  Class  Noted - Resolved             Obesity (BMI 30-39.9) (Chronic)  ICD-10-CM: E66.9   ICD-9-CM: 278.00    08/17/2015 - Present                       * (Principal)Abdominal pain, acute, generalized  ICD-10-CM: R10.84   ICD-9-CM: 789.07, 338.19    08/16/2015 - Present                       Hepatic encephalopathy (HCC)  ICD-10-CM: K72.90   ICD-9-CM: 572.2    09/16/2014 - Present                       Hyperammonemia (HCC)  ICD-10-CM: M57.84   ICD-9-CM: 270.6    09/15/2014 - Present                       Cirrhosis of liver with ascites (HCC) (Chronic)  ICD-10-CM: K74.60   ICD-9-CM: 571.5    05/09/2014 - Present                       Varices,  esophageal (HCC) (Chronic)  ICD-10-CM: I85.00  ICD-9-CM: 456.1    03/25/2013 - Present                       Acute upper GI bleeding  ICD-10-CM: K92.2   ICD-9-CM: 578.9    01/20/2013 - Present                       Hematemesis  ICD-10-CM: K92.0   ICD-9-CM: 578.0    01/19/2013 - Present                       Melena  ICD-10-CM: K92.1   ICD-9-CM: 578.1    01/19/2013 - Present                       Thrombocytopenia (HCC)  ICD-10-CM: D69.6   ICD-9-CM: 287.5    01/19/2013 - Present                       Alcoholic cirrhosis (HCC)  ICD-10-CM: K70.30   ICD-9-CM: 571.2    01/19/2013 - Present                       Chest pain  ICD-10-CM: R07.9   ICD-9-CM: 786.50    01/19/2013 - Present                       Pancytopenia (HCC) (Chronic)  ICD-10-CM: Z61.09661.818   ICD-9-CM: 284.19    11/08/2012 - Present                       Alcohol abuse  ICD-10-CM: F10.10   ICD-9-CM: 305.00    03/11/2012 - Present                       Thrombocytopenia, unspecified (HCC)  ICD-10-CM: D69.6   ICD-9-CM: 287.5    03/10/2012 - Present                       Hepatitis C infection (Chronic)  ICD-10-CM: B19.20   ICD-9-CM: 070.70    12/23/2011 - Present                       Alcoholic cirrhosis of liver (HCC) (Chronic)  ICD-10-CM: K70.30   ICD-9-CM: 571.2    07/26/2011 - Present                       RESOLVED: SOB (shortness of breath)  ICD-10-CM: R06.02   ICD-9-CM: 786.05    08/16/2015 - 08/17/2015                       RESOLVED: GI bleed  ICD-10-CM: K92.2   ICD-9-CM: 578.9    01/20/2013 - 05/20/2014                       RESOLVED: Drug-seeking behavior  ICD-10-CM: Z76.5   ICD-9-CM: 305.90    03/11/2012 - 03/14/2012                       RESOLVED: Upper GI bleed  ICD-10-CM: K92.2   ICD-9-CM: 578.9    03/10/2012 - 03/14/2012  RESOLVED: Acute blood loss anemia  ICD-10-CM: D62   ICD-9-CM: 285.1    03/10/2012 - 03/14/2012                       RESOLVED: Acute encephalopathy  ICD-10-CM: G93.40   ICD-9-CM: 348.30    03/10/2012 - 03/11/2012                        RESOLVED: Other and unspecified noninfectious gastroenteritis and colitis  ICD-10-CM: K52.9   ICD-9-CM: 558.9    07/26/2011 - 03/11/2012                             Hospital Course:    [name] was admitted to the  Hospitalist Service on the 5th floor for treatment of abdominal pain.  He was empirically started on IV antibiotics for possible SBP.  He underwent LVP which showed no signs of infection.  He did report recent UGI bleeding and so he was continued on antibiotics  for possible variceal bleeding as well as acid suppression.  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  Despite removal of almost all of his ascites, he continued to  have severe pain requiring IV Dilaudid for pain control.  He was counseled on compliance with his home regimen as it appeared he was non-compliant prior to admission.  He  was discharged home on 08/20/2015 in improved condition.           PCP: Phys Other, MD      Consults: GI and Palliative Care      Discharge Exam:   See my Progress Note from today.      Disposition: home      Patient Instructions:    A lactulose prescription was inadvertently missed at the time of discharge and was provided to the patient after discharge.        Discharge Medication List as of 08/20/2015 10:21 AM              START taking these medications          Details        oxyCODONE IR (ROXICODONE) 10 mg tab immediate release tablet  Take 1 Tab by mouth every four (4) hours as needed. Max Daily Amount: 60 mg., Print, Disp-42 Tab, R-0               zolpidem (AMBIEN) 5 mg tablet  Take 1 Tab by mouth nightly as needed for Sleep. Max Daily Amount: 5 mg., Print, Disp-7 Tab, R-0                     CONTINUE these medications which have NOT CHANGED          Details        furosemide (LASIX) 40 mg tablet  Take 40 mg by mouth daily., Historical Med               spironolactone (ALDACTONE) 50 mg tablet  Take 50 mg by mouth daily.,  Historical Med               cyanocobalamin 1,000 mcg tablet  Take 1,000 mcg by mouth daily., Historical Med               bismuth subsalicylate (PEPTO-BISMOL) 262 mg/15 mL suspension  Take 30 mL by mouth daily as needed for Indigestion., Historical Med               ibuprofen (MOTRIN) 200 mg tablet  Take 200 mg by mouth daily as needed for Pain., Historical Med               ferrous sulfate 325 mg (65 mg iron) tablet  Take 1 Tab by mouth Daily (before breakfast)., Print, Disp-30 Tab, R-1               multivitamin capsule  Take 1 Cap by mouth daily., Print, Disp-30 Cap, R-1                       Activity: Activity as tolerated   Diet: Cardiac Diet   Wound Care: None needed        Follow-up Information        Follow up With  Details  Comments  Contact Info             Leta Speller, MD    Dr. Nunzio Cory' office will contact you with follow up  [address]   [address] [place] [place] [address] [place] [address]   [phone]                Phys Other, MD      Patient can only remember the practice name and not the physician                   35 minutes were spend on this discharge.      Signed:   Marijo File, MD   08/20/2015   12:00 PM

## 2015-11-24 ENCOUNTER — Encounter: Attending: Gastroenterology | Primary: Family Medicine

## 2016-02-19 ENCOUNTER — Encounter: Attending: Gastroenterology | Primary: Family Medicine

## 2016-05-01 ENCOUNTER — Inpatient Hospital Stay (HOSPITAL_COMMUNITY)
Admission: EM | Admit: 2016-05-01 | Discharge: 2016-05-05 | DRG: 378 | Disposition: A | Discharge status: Home / Self Care | Payer: Self-pay | Attending: Internal Medicine | Admitting: Internal Medicine

## 2016-05-01 ENCOUNTER — Emergency Department (HOSPITAL_COMMUNITY): Payer: Self-pay

## 2016-05-01 ENCOUNTER — Encounter (HOSPITAL_COMMUNITY): Payer: Self-pay | Admitting: Emergency Medicine

## 2016-05-01 DIAGNOSIS — Y900 Blood alcohol level of less than 20 mg/100 ml: Secondary | ICD-10-CM | POA: Diagnosis present

## 2016-05-01 DIAGNOSIS — K922 Gastrointestinal hemorrhage, unspecified: Secondary | ICD-10-CM

## 2016-05-01 DIAGNOSIS — R109 Unspecified abdominal pain: Secondary | ICD-10-CM

## 2016-05-01 DIAGNOSIS — R55 Syncope and collapse: Secondary | ICD-10-CM

## 2016-05-01 DIAGNOSIS — R740 Nonspecific elevation of levels of transaminase and lactic acid dehydrogenase [LDH]: Secondary | ICD-10-CM | POA: Diagnosis present

## 2016-05-01 DIAGNOSIS — K7290 Hepatic failure, unspecified without coma: Secondary | ICD-10-CM | POA: Diagnosis present

## 2016-05-01 DIAGNOSIS — R079 Chest pain, unspecified: Secondary | ICD-10-CM

## 2016-05-01 DIAGNOSIS — G629 Polyneuropathy, unspecified: Secondary | ICD-10-CM | POA: Diagnosis present

## 2016-05-01 DIAGNOSIS — K766 Portal hypertension: Secondary | ICD-10-CM | POA: Diagnosis present

## 2016-05-01 DIAGNOSIS — D62 Acute posthemorrhagic anemia: Secondary | ICD-10-CM

## 2016-05-01 DIAGNOSIS — K429 Umbilical hernia without obstruction or gangrene: Secondary | ICD-10-CM | POA: Diagnosis present

## 2016-05-01 DIAGNOSIS — I8500 Esophageal varices without bleeding: Secondary | ICD-10-CM | POA: Diagnosis present

## 2016-05-01 DIAGNOSIS — B1920 Unspecified viral hepatitis C without hepatic coma: Secondary | ICD-10-CM | POA: Diagnosis present

## 2016-05-01 DIAGNOSIS — K3189 Other diseases of stomach and duodenum: Secondary | ICD-10-CM | POA: Diagnosis present

## 2016-05-01 DIAGNOSIS — K921 Melena: Principal | ICD-10-CM | POA: Diagnosis present

## 2016-05-01 DIAGNOSIS — Z9114 Patient's other noncompliance with medication regimen: Secondary | ICD-10-CM

## 2016-05-01 DIAGNOSIS — R188 Other ascites: Secondary | ICD-10-CM | POA: Diagnosis present

## 2016-05-01 DIAGNOSIS — K7460 Unspecified cirrhosis of liver: Secondary | ICD-10-CM | POA: Diagnosis present

## 2016-05-01 DIAGNOSIS — Z87891 Personal history of nicotine dependence: Secondary | ICD-10-CM

## 2016-05-01 DIAGNOSIS — F1021 Alcohol dependence, in remission: Secondary | ICD-10-CM | POA: Diagnosis present

## 2016-05-01 DIAGNOSIS — D72819 Decreased white blood cell count, unspecified: Secondary | ICD-10-CM | POA: Diagnosis present

## 2016-05-01 DIAGNOSIS — Z79899 Other long term (current) drug therapy: Secondary | ICD-10-CM

## 2016-05-01 DIAGNOSIS — D6959 Other secondary thrombocytopenia: Secondary | ICD-10-CM | POA: Diagnosis present

## 2016-05-01 DIAGNOSIS — R161 Splenomegaly, not elsewhere classified: Secondary | ICD-10-CM | POA: Diagnosis present

## 2016-05-01 HISTORY — DX: Unspecified cirrhosis of liver: K74.60

## 2016-05-01 LAB — ETHANOL: Alcohol, Ethyl (B): <5 mg/dL (ref <5)

## 2016-05-01 LAB — CBC WITH DIFFERENTIAL/PLATELET
BASOS ABS: 0 10*3/uL (ref 0.0–0.1)
BASOS PCT: 1 %
Basophils Absolute: 0 10*3/uL (ref 0.0–0.1)
Basophils Relative: 1 %
EOS ABS: 0.2 10*3/uL (ref 0.0–0.7)
Eosinophils Absolute: 0.3 10*3/uL (ref 0.0–0.7)
Eosinophils Relative: 7 %
Eosinophils Relative: 5 %
HEMATOCRIT: 27.5 % — AB (ref 39.0–52.0)
HEMATOCRIT: 26.8 % — AB (ref 39.0–52.0)
HEMOGLOBIN: 8.7 g/dL — AB (ref 13.0–17.0)
HEMOGLOBIN: 8.5 g/dL — AB (ref 13.0–17.0)
LYMPHS ABS: 0.5 10*3/uL — AB (ref 0.7–4.0)
LYMPHS ABS: 0.7 10*3/uL (ref 0.7–4.0)
LYMPHS PCT: 12 %
Lymphocytes Relative: 15 %
MCH: 26.7 pg (ref 26.0–34.0)
MCH: 26.8 pg (ref 26.0–34.0)
MCHC: 31.6 g/dL (ref 30.0–36.0)
MCHC: 31.7 g/dL (ref 30.0–36.0)
MCV: 84.4 fL (ref 78.0–100.0)
MCV: 84.5 fL (ref 78.0–100.0)
Monocytes Absolute: 0.4 10*3/uL (ref 0.1–1.0)
Monocytes Absolute: 0.7 10*3/uL (ref 0.1–1.0)
Monocytes Relative: 11 %
Monocytes Relative: 15 %
NEUTROS ABS: 2.7 10*3/uL (ref 1.7–7.7)
NEUTROS ABS: 2.8 10*3/uL (ref 1.7–7.7)
NEUTROS PCT: 70 %
NEUTROS PCT: 64 %
PLATELETS: 32 10*3/uL — AB (ref 150–400)
Platelets: 38 10*3/uL — ABNORMAL LOW (ref 150–400)
RBC: 3.26 MIL/uL — AB (ref 4.22–5.81)
RBC: 3.17 MIL/uL — AB (ref 4.22–5.81)
RDW: 19.6 % — ABNORMAL HIGH (ref 11.5–15.5)
RDW: 19.7 % — ABNORMAL HIGH (ref 11.5–15.5)
WBC: 3.9 10*3/uL — AB (ref 4.0–10.5)
WBC: 4.3 10*3/uL (ref 4.0–10.5)

## 2016-05-01 LAB — POC OCCULT BLOOD, ED: Fecal Occult Bld: POSITIVE — AB

## 2016-05-01 LAB — CBC
HEMATOCRIT: 25.7 % — AB (ref 39.0–52.0)
Hemoglobin: 8 g/dL — ABNORMAL LOW (ref 13.0–17.0)
MCH: 26.1 pg (ref 26.0–34.0)
MCHC: 31.1 g/dL (ref 30.0–36.0)
MCV: 84 fL (ref 78.0–100.0)
Platelets: 33 10*3/uL — ABNORMAL LOW (ref 150–400)
RBC: 3.06 MIL/uL — AB (ref 4.22–5.81)
RDW: 19.6 % — ABNORMAL HIGH (ref 11.5–15.5)
WBC: 4.1 10*3/uL (ref 4.0–10.5)

## 2016-05-01 LAB — COMPREHENSIVE METABOLIC PANEL
ALT: 69 U/L — AB (ref 17–63)
AST: 101 U/L — ABNORMAL HIGH (ref 15–41)
Albumin: 2.6 g/dL — ABNORMAL LOW (ref 3.5–5.0)
Alkaline Phosphatase: 72 U/L (ref 38–126)
Anion gap: 4 — ABNORMAL LOW (ref 5–15)
BUN: 17 mg/dL (ref 6–20)
CHLORIDE: 107 mmol/L (ref 101–111)
CO2: 26 mmol/L (ref 22–32)
Calcium: 7.3 mg/dL — ABNORMAL LOW (ref 8.9–10.3)
Creatinine, Ser: 0.72 mg/dL (ref 0.61–1.24)
Glucose, Bld: 138 mg/dL — ABNORMAL HIGH (ref 65–99)
POTASSIUM: 3.9 mmol/L (ref 3.5–5.1)
Sodium: 137 mmol/L (ref 135–145)
Total Bilirubin: 1.2 mg/dL (ref 0.3–1.2)
Total Protein: 5.3 g/dL — ABNORMAL LOW (ref 6.5–8.1)

## 2016-05-01 LAB — RAPID URINE DRUG SCREEN, HOSP PERFORMED
Amphetamines: NOT DETECTED
BARBITURATES: NOT DETECTED
BENZODIAZEPINES: NOT DETECTED
COCAINE: NOT DETECTED
OPIATES: NOT DETECTED
TETRAHYDROCANNABINOL: POSITIVE — AB

## 2016-05-01 LAB — I-STAT TROPONIN, ED: Troponin i, poc: 0 ng/mL (ref 0.00–0.08)

## 2016-05-01 LAB — LIPASE, BLOOD: LIPASE: 69 U/L — AB (ref 11–51)

## 2016-05-01 LAB — TROPONIN I: Troponin I: <0.03 ng/mL (ref <0.03)

## 2016-05-01 MED ORDER — ONDANSETRON HCL 4 MG PO TABS
4.0000 mg | ORAL_TABLET | Freq: Four times a day (QID) | ORAL | Status: DC | PRN
  Administered 2016-05-03: 4 mg via ORAL
  Filled 2016-05-01: qty 1

## 2016-05-01 MED ORDER — SODIUM CHLORIDE 0.9 % IV SOLN
50.0000 ug/h | INTRAVENOUS | Status: DC
  Administered 2016-05-01 – 2016-05-02 (×3): 50 ug/h via INTRAVENOUS
  Filled 2016-05-01 (×10): qty 1

## 2016-05-01 MED ORDER — OCTREOTIDE LOAD VIA INFUSION
50.0000 ug | Freq: Once | INTRAVENOUS | Status: AC
  Administered 2016-05-01: 50 ug via INTRAVENOUS
  Filled 2016-05-01: qty 25

## 2016-05-01 MED ORDER — ALPRAZOLAM 0.5 MG PO TABS
0.5000 mg | ORAL_TABLET | Freq: Once | ORAL | Status: AC
  Administered 2016-05-01: 0.5 mg via ORAL
  Filled 2016-05-01: qty 1

## 2016-05-01 MED ORDER — ONDANSETRON HCL 4 MG/2ML IJ SOLN
4.0000 mg | Freq: Four times a day (QID) | INTRAMUSCULAR | Status: DC | PRN
  Administered 2016-05-04: 4 mg via INTRAVENOUS

## 2016-05-01 MED ORDER — MORPHINE SULFATE (PF) 4 MG/ML IV SOLN
4.0000 mg | Freq: Once | INTRAVENOUS | Status: AC
  Administered 2016-05-01: 4 mg via INTRAVENOUS
  Filled 2016-05-01: qty 1

## 2016-05-01 MED ORDER — MECLIZINE HCL 25 MG PO TABS
25.0000 mg | ORAL_TABLET | Freq: Once | ORAL | Status: AC
  Administered 2016-05-01: 25 mg via ORAL
  Filled 2016-05-01: qty 1

## 2016-05-01 MED ORDER — SODIUM CHLORIDE 0.9 % IV BOLUS (SEPSIS)
1000.0000 mL | Freq: Once | INTRAVENOUS | Status: AC
  Administered 2016-05-01: 1000 mL via INTRAVENOUS

## 2016-05-01 MED ORDER — SODIUM CHLORIDE 0.9 % IV SOLN
80.0000 mg | Freq: Once | INTRAVENOUS | Status: AC
  Administered 2016-05-01: 80 mg via INTRAVENOUS
  Filled 2016-05-01: qty 80

## 2016-05-01 MED ORDER — ACETAMINOPHEN 650 MG RE SUPP: 650.0000 mg | Freq: Four times a day (QID) | RECTAL | Status: DC | PRN

## 2016-05-01 MED ORDER — IOPAMIDOL (ISOVUE-300) INJECTION 61%
INTRAVENOUS | Status: AC
  Administered 2016-05-01: 30 mL
  Filled 2016-05-01: qty 30

## 2016-05-01 MED ORDER — SODIUM CHLORIDE 0.9 % IV SOLN
8.0000 mg/h | INTRAVENOUS | Status: DC
  Administered 2016-05-02 (×3): 8 mg/h via INTRAVENOUS
  Filled 2016-05-01 (×11): qty 80

## 2016-05-01 MED ORDER — PANTOPRAZOLE SODIUM 40 MG IV SOLR
40.0000 mg | Freq: Two times a day (BID) | INTRAVENOUS | Status: DC
Start: 2016-05-05 — End: 2016-05-04

## 2016-05-01 MED ORDER — SODIUM CHLORIDE 0.9 % IV SOLN
INTRAVENOUS | Status: AC
  Administered 2016-05-01: via INTRAVENOUS

## 2016-05-01 MED ORDER — MORPHINE SULFATE (PF) 2 MG/ML IV SOLN
1.0000 mg | INTRAVENOUS | Status: DC | PRN
  Administered 2016-05-01 – 2016-05-03 (×6): 1 mg via INTRAVENOUS
  Filled 2016-05-01 (×6): qty 1

## 2016-05-01 MED ORDER — ONDANSETRON HCL 4 MG/2ML IJ SOLN
4.0000 mg | Freq: Once | INTRAMUSCULAR | Status: AC
  Administered 2016-05-01: 4 mg via INTRAVENOUS
  Filled 2016-05-01: qty 2

## 2016-05-01 MED ORDER — ACETAMINOPHEN 325 MG PO TABS: 650.0000 mg | ORAL_TABLET | Freq: Four times a day (QID) | ORAL | Status: DC | PRN

## 2016-05-01 MED ORDER — IOPAMIDOL (ISOVUE-370) INJECTION 76%
INTRAVENOUS | Status: AC
  Administered 2016-05-01: 100 mL
  Filled 2016-05-01: qty 100

## 2016-05-01 NOTE — ED Notes (Signed)
1 gold color earing and 1 silver color necklace placed in pink denture cup and zipped in side pocket of duffle bag.

## 2016-05-01 NOTE — ED Notes (Signed)
Pt to CT

## 2016-05-01 NOTE — ED Notes (Signed)
[place], [place].  Pt in radiology at this time.
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
## 2016-05-01 NOTE — ED Notes (Signed)
Pt is sitting on bedside commode at this time.

## 2016-05-01 NOTE — H&P (Addendum)
History and Physical    Stephen Carney RUE:[id number] DOB: 1950-03-31 DOA: 05/01/2016  PCP: No PCP Per Patient  Patient coming from: Home.  Chief Complaint: Loss of consciousness.  HPI: Stephen Carney is a 66 y.o. male with history of alcohol abuse and cirrhosis of the liver who has had previous esophageal variceal banding last episode of GI bleed in October 2017 following which patient states he has not drank alcohol was traveling in the bus when patient had a syncopal episode and EMS was called and patient was brought to the ER. In the ER patient had frank rectal bleeding. Patient denies any chest pain shortness of breath but has been having abdominal discomfort which is diffuse. Denies any vomiting. Patient states he does take naproxen off and on. An off-and-on has been having some diarrhea. Patient used to be on Lasix spironolactone Xifaxan which patient states he has not recently taken.  ED Course: CT angiogram of the chest was done which was negative for PE but did show features concerning for cirrhosis of the liver. CT head was unremarkable. Hemoglobin was around 8 with no old labs to compare. Patient is also thrombocytopenic. EKG shows normal sinus rhythm with QTC of 479 ms. Patient is being admitted for GI bleed and syncope.  Review of Systems: As per HPI, rest all negative.   Past Medical History:  Diagnosis Date  . Liver cirrhosis (HCC)     History reviewed. No pertinent surgical history.   reports that he has quit smoking. He has never used smokeless tobacco. He reports that he does not drink alcohol or use drugs.  No Known Allergies  Family History  Problem Relation Age of Onset  . Cirrhosis Neg Hx     Prior to Admission medications   Medication Sig Start Date End Date Taking? Authorizing Provider  Ascorbic Acid (VITAMIN C PO) Take 1 tablet by mouth daily.   Yes Historical Provider, MD  Cyanocobalamin (VITAMIN B-12 PO) Take 1 tablet by mouth daily.   Yes  Historical Provider, MD  furosemide (LASIX) 40 MG tablet Take 40 mg by mouth daily as needed for fluid or edema.   Yes Historical Provider, MD  Naproxen Sod-Diphenhydramine (ALEVE PM PO) Take 1 tablet by mouth at bedtime as needed (sleep).   Yes Historical Provider, MD  PRESCRIPTION MEDICATION Inhale 1 puff into the lungs every 6 (six) hours as needed (shortness of breath/wheezing). Unknown inhaler   Yes Historical Provider, MD    Physical Exam: Vitals:   05/01/16 1732 05/01/16 1933 05/01/16 2000 05/01/16 2047  BP:  123/83 120/88 114/71  Pulse:  100 92 90  Resp:  21 19 18   Temp: 97.9 F (36.6 C)   98.3 F (36.8 C)  TempSrc:    Oral  SpO2:  97% 99% 99%  Weight:    106.4 kg (234 lb 8 oz)  Height:    5\' 9"  (1.753 m)      Constitutional: Moderately built and nourished. Vitals:   05/01/16 1732 05/01/16 1933 05/01/16 2000 05/01/16 2047  BP:  123/83 120/88 114/71  Pulse:  100 92 90  Resp:  21 19 18   Temp: 97.9 F (36.6 C)   98.3 F (36.8 C)  TempSrc:    Oral  SpO2:  97% 99% 99%  Weight:    106.4 kg (234 lb 8 oz)  Height:    5\' 9"  (1.753 m)   Eyes: Anicteric. No pallor. ENMT: No discharge from the ears eyes nose or mouth. Neck: No  mass felt there no JVD appreciated. Respiratory: No rhonchi or crepitations. Cardiovascular: S1-S2. No murmurs appreciated. Abdomen: Umbilical hernia seen. No guarding or rigidity or tenderness. Bowel sounds appreciated. Musculoskeletal: No edema. Skin: No rash. Neurologic: Alert awake oriented to time place and person. Moves all extremities. Psychiatric: Appears normal. Normal affect.   Labs on Admission: I have personally reviewed following labs and imaging studies  CBC:  Recent Labs Lab 05/01/16 1509 05/01/16 1951  WBC 3.9* 4.3  NEUTROABS 2.7 2.8  HGB 8.7* 8.5*  HCT 27.5* 26.8*  MCV 84.4 84.5  PLT 32* 38*   Basic Metabolic Panel:  Recent Labs Lab 05/01/16 1509  NA 137  K 3.9  CL 107  CO2 26  GLUCOSE 138*  BUN 17    CREATININE 0.72  CALCIUM 7.3*   GFR: Estimated Creatinine Clearance: 113.6 mL/min (by C-G formula based on SCr of 0.72 mg/dL). Liver Function Tests:  Recent Labs Lab 05/01/16 1509  AST 101*  ALT 69*  ALKPHOS 72  BILITOT 1.2  PROT 5.3*  ALBUMIN 2.6*    Recent Labs Lab 05/01/16 1509  LIPASE 69*   No results for input(s): AMMONIA in the last 168 hours. Coagulation Profile: No results for input(s): INR, PROTIME in the last 168 hours. Cardiac Enzymes: No results for input(s): CKTOTAL, CKMB, CKMBINDEX, TROPONINI in the last 168 hours. BNP (last 3 results) No results for input(s): PROBNP in the last 8760 hours. HbA1C: No results for input(s): HGBA1C in the last 72 hours. CBG: No results for input(s): GLUCAP in the last 168 hours. Lipid Profile: No results for input(s): CHOL, HDL, LDLCALC, TRIG, CHOLHDL, LDLDIRECT in the last 72 hours. Thyroid Function Tests: No results for input(s): TSH, T4TOTAL, FREET4, T3FREE, THYROIDAB in the last 72 hours. Anemia Panel: No results for input(s): VITAMINB12, FOLATE, FERRITIN, TIBC, IRON, RETICCTPCT in the last 72 hours. Urine analysis: No results found for: COLORURINE, APPEARANCEUR, LABSPEC, PHURINE, GLUCOSEU, HGBUR, BILIRUBINUR, KETONESUR, PROTEINUR, UROBILINOGEN, NITRITE, LEUKOCYTESUR Sepsis Labs: @LABRCNTIP (procalcitonin:4,lacticidven:4) )No results found for this or any previous visit (from the past 240 hour(s)).   Radiological Exams on Admission: Ct Head Wo Contrast  Result Date: 05/01/2016 CLINICAL DATA:  Dizziness. Headache. Back pain. Syncope. Personal history of liver cirrhosis. EXAM: CT HEAD WITHOUT CONTRAST TECHNIQUE: Contiguous axial images were obtained from the base of the skull through the vertex without intravenous contrast. COMPARISON:  None. FINDINGS: Brain: Mild atrophy and white matter changes are present. Basal ganglia are intact. No acute or focal infarct is evident. Ventricles are of normal size. No significant  axial fluid collection is present. Vascular: Atherosclerotic calcifications are present within the cavernous internal carotid arteries bilaterally. There is no hyperdense vessel. Skull: The calvarium is within normal limits. No focal lytic or blastic lesions are present. Sinuses/Orbits: Mild mucosal thickening is present in the maxillary sinuses, left greater than right. Wall thickening suggest chronic disease. The remaining paranasal sinuses and mastoid air cells are clear. A high-riding jugular bulb is present on the right. The globes and orbits are within normal limits. IMPRESSION: 1. Mild atrophy and white matter disease. 2. Atherosclerosis. 3. No acute intracranial abnormality. Electronically Signed   By: Marin Roberts M.D.   On: 05/01/2016 15:37   Ct Angio Chest Pe W And/or Wo Contrast  Result Date: 05/01/2016 CLINICAL DATA:  Dizziness.  Chest pain shortness of breath. EXAM: CT ANGIOGRAPHY CHEST WITH CONTRAST TECHNIQUE: Multidetector CT imaging of the chest was performed using the standard protocol during bolus administration of intravenous contrast. Multiplanar CT image  reconstructions and MIPs were obtained to evaluate the vascular anatomy. CONTRAST:  80 mL Isovue 370 COMPARISON:  None. FINDINGS: Cardiovascular: The heart size is normal. Pulmonary arterial opacification is satisfactory. No focal filling defects are present to suggest pulmonary embolus. Minimal atherosclerotic calcifications are present in the aorta. There is no aneurysm. Great vessel origins are within normal limits. Mediastinum/Nodes: No significant mediastinal adenopathy is present. The esophagus is within normal limits. Lungs/Pleura: The lungs are clear. No focal nodule, mass, or airspace disease is present. There is no significant pleural effusion. Mild dependent atelectasis is present. Upper Abdomen: Cirrhotic changes are present in the liver. The spleen is enlarged. Stomach is unremarkable. A small amount of free fluid is  evident adjacent to the liver. No discrete hepatic lesions are present. Musculoskeletal: More remote present. Vertebral body heights alignment are otherwise normal. Ribs are within normal limits. Bilateral gynecomastia is evident. Review of the MIP images confirms the above findings. IMPRESSION: 1. No pulmonary embolus. 2. Hepatic cirrhosis with mild ascites. 3. Associated splenomegaly. 4. Bilateral gynecomastia. Electronically Signed   By: Marin Robertshristopher  Mattern M.D.   On: 05/01/2016 18:34    EKG: Independently reviewed. Normal sinus rhythm with QTC of 479 ms.  Assessment/Plan Principal Problem:   Acute GI bleeding Active Problems:   Acute blood loss anemia   Syncope    1. Acute GI bleeding - given the history of cirrhosis with banding and use of NSAIDs will keep patient on octreotide and Protonix infusion. Empiric GI coverage with antibiotics given the history of cirrhosis. Follow CBC and consult gastroenterologist in a.m. Since patient has an umbilical hernia and also is complaining of diffuse abdominal discomfort we'll get a CT abdomen with by mouth contrast. 2. Syncope - likely from GI bleed. Patient was given fluid bolus in the ER since patient's blood pressure was in the low normal. QTc is normal. Will check 2-D echo. 3. Anemia probably from acute blood loss no old labs to compare - follow CBC. 4. Cirrhosis of the liver - likely from alcohol abuse. Patient states she also was positive for hepatitis does not recall which one. Acute hepatitis panel has been ordered. Empiric antibiotics for GI bleed. 5. Thrombocytopenia - probably from cirrhosis. 6. History of alcoholism - patient states she has not had any alcohol since October 2017. Will keep patient on thiamine.   DVT prophylaxis: SCDs. Code Status: Full code.  Family Communication: Discussed with patient.  Disposition Plan: Home.  Consults called: None.  Admission status: Inpatient.    Stephen ClosKAKRAKANDY,Alantra Popoca N. MD [facility] Pager [phone].  If 7PM-7AM, please contact night-coverage [email or web address] Password [email or web address]  05/01/2016, 9:52 PM

## 2016-05-01 NOTE — ED Triage Notes (Signed)
Pt states he was on a bus going out of town, became dizzy "makes me hurt all over my body"    Had an episode of chest pain "feels like heartburn" while hot, dizzy and on a crowded bus.  Chest pain now dissipated.   Was given zofran IV by EMS.

## 2016-05-02 ENCOUNTER — Encounter (HOSPITAL_COMMUNITY): Payer: Self-pay | Admitting: General Practice

## 2016-05-02 ENCOUNTER — Inpatient Hospital Stay (HOSPITAL_COMMUNITY): Payer: Self-pay

## 2016-05-02 DIAGNOSIS — F1021 Alcohol dependence, in remission: Secondary | ICD-10-CM

## 2016-05-02 DIAGNOSIS — R188 Other ascites: Secondary | ICD-10-CM | POA: Diagnosis present

## 2016-05-02 DIAGNOSIS — K7290 Hepatic failure, unspecified without coma: Secondary | ICD-10-CM

## 2016-05-02 DIAGNOSIS — R55 Syncope and collapse: Secondary | ICD-10-CM

## 2016-05-02 DIAGNOSIS — K7460 Unspecified cirrhosis of liver: Secondary | ICD-10-CM | POA: Diagnosis present

## 2016-05-02 LAB — CBC
HCT: 25.4 % — ABNORMAL LOW (ref 39.0–52.0)
HCT: 24.3 % — ABNORMAL LOW (ref 39.0–52.0)
HEMATOCRIT: 26.9 % — AB (ref 39.0–52.0)
Hemoglobin: 8.5 g/dL — ABNORMAL LOW (ref 13.0–17.0)
Hemoglobin: 8 g/dL — ABNORMAL LOW (ref 13.0–17.0)
Hemoglobin: 7.6 g/dL — ABNORMAL LOW (ref 13.0–17.0)
MCH: 26.5 pg (ref 26.0–34.0)
MCH: 26.4 pg (ref 26.0–34.0)
MCH: 26.6 pg (ref 26.0–34.0)
MCHC: 31.6 g/dL (ref 30.0–36.0)
MCHC: 31.5 g/dL (ref 30.0–36.0)
MCHC: 31.3 g/dL (ref 30.0–36.0)
MCV: 83.8 fL (ref 78.0–100.0)
MCV: 83.8 fL (ref 78.0–100.0)
MCV: 85 fL (ref 78.0–100.0)
PLATELETS: 32 10*3/uL — AB (ref 150–400)
PLATELETS: 31 10*3/uL — AB (ref 150–400)
Platelets: 39 10*3/uL — ABNORMAL LOW (ref 150–400)
RBC: 3.21 MIL/uL — ABNORMAL LOW (ref 4.22–5.81)
RBC: 3.03 MIL/uL — AB (ref 4.22–5.81)
RBC: 2.86 MIL/uL — AB (ref 4.22–5.81)
RDW: 19.9 % — AB (ref 11.5–15.5)
RDW: 19.9 % — ABNORMAL HIGH (ref 11.5–15.5)
RDW: 19.9 % — ABNORMAL HIGH (ref 11.5–15.5)
WBC: 4.8 10*3/uL (ref 4.0–10.5)
WBC: 3.7 10*3/uL — ABNORMAL LOW (ref 4.0–10.5)
WBC: 2.8 10*3/uL — ABNORMAL LOW (ref 4.0–10.5)

## 2016-05-02 LAB — ABO/RH: ABO/RH(D): A POS

## 2016-05-02 LAB — PREPARE RBC (CROSSMATCH)

## 2016-05-02 LAB — BASIC METABOLIC PANEL
ANION GAP: 4 — AB (ref 5–15)
BUN: 15 mg/dL (ref 6–20)
CO2: 27 mmol/L (ref 22–32)
Calcium: 7.1 mg/dL — ABNORMAL LOW (ref 8.9–10.3)
Chloride: 107 mmol/L (ref 101–111)
Creatinine, Ser: 0.71 mg/dL (ref 0.61–1.24)
GFR calc Af Amer: >60 mL/min (ref >60)
Glucose, Bld: 84 mg/dL (ref 65–99)
POTASSIUM: 3.7 mmol/L (ref 3.5–5.1)
SODIUM: 138 mmol/L (ref 135–145)

## 2016-05-02 LAB — HEPATIC FUNCTION PANEL
ALT: 67 U/L — ABNORMAL HIGH (ref 17–63)
AST: 99 U/L — ABNORMAL HIGH (ref 15–41)
Albumin: 2.6 g/dL — ABNORMAL LOW (ref 3.5–5.0)
Alkaline Phosphatase: 59 U/L (ref 38–126)
BILIRUBIN INDIRECT: 1 mg/dL — AB (ref 0.3–0.9)
Bilirubin, Direct: 0.4 mg/dL (ref 0.1–0.5)
Total Bilirubin: 1.4 mg/dL — ABNORMAL HIGH (ref 0.3–1.2)
Total Protein: 5.4 g/dL — ABNORMAL LOW (ref 6.5–8.1)

## 2016-05-02 LAB — ECHOCARDIOGRAM COMPLETE
Height: 69 in
Weight: 3752 oz

## 2016-05-02 LAB — HIV ANTIBODY (ROUTINE TESTING W REFLEX): HIV SCREEN 4TH GENERATION: NONREACTIVE

## 2016-05-02 LAB — INFLUENZA PANEL BY PCR (TYPE A & B)
Influenza A By PCR: NEGATIVE
Influenza B By PCR: NEGATIVE

## 2016-05-02 LAB — GLUCOSE, CAPILLARY
Glucose-Capillary: 192 mg/dL — ABNORMAL HIGH (ref 65–99)
Glucose-Capillary: 126 mg/dL — ABNORMAL HIGH (ref 65–99)

## 2016-05-02 MED ORDER — ZOLPIDEM TARTRATE 5 MG PO TABS
5.0000 mg | ORAL_TABLET | Freq: Once | ORAL | Status: AC
  Administered 2016-05-03: 5 mg via ORAL
  Filled 2016-05-02: qty 1

## 2016-05-02 MED ORDER — THIAMINE HCL 100 MG/ML IJ SOLN
100.0000 mg | Freq: Every day | INTRAMUSCULAR | Status: DC
  Administered 2016-05-02 – 2016-05-05 (×3): 100 mg via INTRAVENOUS
  Filled 2016-05-02 (×4): qty 2

## 2016-05-02 MED ORDER — DEXTROSE 5 % IV SOLN
2.0000 g | Freq: Every day | INTRAVENOUS | Status: DC
  Administered 2016-05-02 – 2016-05-05 (×4): 2 g via INTRAVENOUS
  Filled 2016-05-02 (×5): qty 2

## 2016-05-02 MED ORDER — SODIUM CHLORIDE 0.9 % IV SOLN
Freq: Once | INTRAVENOUS | Status: AC
  Administered 2016-05-02: 18:00:00 via INTRAVENOUS

## 2016-05-02 NOTE — Progress Notes (Signed)
PROGRESS NOTE  Stephen Carney  OZH:[id number] DOB: 25-Aug-1950 DOA: 05/01/2016 PCP: No PCP Per Patient   Brief Narrative: Stephen Carney is a 66 y.o. male with a history of alcohol abuse and cirrhosis of the liver who has had previous esophageal variceal banding (last GI bleed in Oct 2017) who was traveling by bus from home near [place], [place] to a job interview in [place], [place] when he had a syncopal event. EMS was called and on arrival to the ED, he demonstrated frank blood in stool. The patient reported diffuse abdominal pain, chest pain, and dizziness as well as intermittent naproxen use, and nonadherence to lasix, spironolactone and xifaxan.  On arrival, BP 112/77, HR 85, orthostatics negative. Hgb 8.5 > 8.0 and platelets 38 > 32 (unknown baseline of both). Troponin negative. CTA chest was negative for PE but did show features concerning for cirrhosis of the liver. CT abd/pelvis showed decompensated cirrhosis with ascites and fat-containing umbilical hernia. CT head was unremarkable. ECG showed NSR w/ QTc of 479 ms. He was admitted for syncope due to lower GI bleeding. GI was consulted and is planning EGD 3/13. Hemoglobin has remained stable (8.5 > 8 > 8.5 > 8).  Assessment & Plan: Principal Problem:   Acute GI bleeding Active Problems:   Acute blood loss anemia   Syncope  Acute GI bleeding: Homero Fellers BRBPR in ED and this AM. Hgb stable at 8.0 this AM. BP soft. CECT abdomen showed decompensated cirrhosis, ascites, abdominal varices, hepatic enteropathy and small fat-containing umbilical hernia. - Clear liquids, NPO p MN for EGD 3/13 - Continue octreotide and PPI gtt's - Empiric abx given h/o cirrhosis - Serial CBC  Syncope: Due to abrupt GI bleeding/anemia. Negative orthostatics after IV fluids in ID. QTc . Echocardiogram showed EF 55-60%, mild LVH, no RWMA's, normal valves. - Continue telemetry  Anemia: Presumed acute blood loss anemia on unknown baseline. - Monitor CBC -  Anemia panel ordered - Recheck CBC q12h, transfuse < 8 with active bleeding.  Cirrhosis of the liver: Likely from alcohol abuse and hepatitis (reported by pt, unknown type). Elevated transaminases with ascites.  - Acute hepatitis panel has been ordered.  - Check ammonia in AM, currently A&O but mildly drowsy. - Empiric CTX for GI bleed. 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 +THC on UDS.  - Monitor closely - On thiamine  DVT prophylaxis: SCDs Code Status: Full Family Communication: None at bedside Disposition Plan: Continue inpatient work up, anticipate eventual DC to home.  Consultants:   Gastroenterology.   Procedures:   None  Antimicrobials:  Ceftriaxone 3/11 >>    Subjective: Pt reports improving "pain all over" and no dizziness/light-headedness, chest pain, dyspnea. Eating clear liquids without nausea or vomiting.  Objective: Vitals:   05/01/16 1933 05/01/16 2000 05/01/16 2047 05/02/16 0624  BP: 123/83 120/88 114/71 92/64  Pulse: 100 92 90 94  Resp: 21 19 18 16   Temp:   98.3 F (36.8 C) 97.3 F (36.3 C)  TempSrc:   Oral Oral  SpO2: 97% 99% 99% 95%  Weight:   106.4 kg (234 lb 8 oz)   Height:   5\' 9"  (1.753 m)     Intake/Output Summary (Last 24 hours) at 05/02/16 0709 Last data filed at 05/01/16 1815  Gross per 24 hour  Intake  1000 ml  Output                0 ml  Net             1000 ml   Filed Weights   05/01/16 1440 05/01/16 2047  Weight: 102.1 kg (225 lb) 106.4 kg (234 lb 8 oz)    Examination: General exam: Chronically ill-appearing 66 y.o. male in no distress  Respiratory system: Non-labored breathing ambient air. Clear to auscultation bilaterally.  Cardiovascular system: Regular rate and rhythm. No murmur, rub, or gallop. No JVD, and no pedal edema. Gastrointestinal system:  Abdomen distended w/fluid wave. umbilical hernia reducible with mild tenderness. +BS. No hepatosplenomegaly palpated. Central nervous system: Alert and oriented. No focal neurological deficits. Extremities: Warm, no deformities. Skin: No rashes, lesions ulcers. Diffuse tattoos.  Psychiatry: Judgement and insight appear normal. Mood & affect appropriate.   Data Reviewed: I have personally reviewed following labs and imaging studies  CBC:  Recent Labs Lab 05/01/16 1509 05/01/16 1951 05/01/16 2205 05/02/16 0251 05/02/16 0607  WBC 3.9* 4.3 4.1 4.8 3.7*  NEUTROABS 2.7 2.8  --   --   --   HGB 8.7* 8.5* 8.0* 8.5* 8.0*  HCT 27.5* 26.8* 25.7* 26.9* 25.4*  MCV 84.4 84.5 84.0 83.8 83.8  PLT 32* 38* 33* 39* 32*   Basic Metabolic Panel:  Recent Labs Lab 05/01/16 1509  NA 137  K 3.9  CL 107  CO2 26  GLUCOSE 138*  BUN 17  CREATININE 0.72  CALCIUM 7.3*   GFR: Estimated Creatinine Clearance: 113.6 mL/min (by C-G formula based on SCr of 0.72 mg/dL). Liver Function Tests:  Recent Labs Lab 05/01/16 1509  AST 101*  ALT 69*  ALKPHOS 72  BILITOT 1.2  PROT 5.3*  ALBUMIN 2.6*    Recent Labs Lab 05/01/16 1509  LIPASE 69*   No results for input(s): AMMONIA in the last 168 hours. Coagulation Profile: No results for input(s): INR, PROTIME in the last 168 hours. Cardiac Enzymes:  Recent Labs Lab 05/01/16 2205  TROPONINI <0.03   BNP (last 3 results) No results for input(s): PROBNP in the last 8760 hours. HbA1C: No results for input(s): HGBA1C in the last 72 hours. CBG: No results for input(s): GLUCAP in the last 168 hours. Lipid Profile: No results for input(s): CHOL, HDL, LDLCALC, TRIG, CHOLHDL, LDLDIRECT in the last 72 hours. Thyroid Function Tests: No results for input(s): TSH, T4TOTAL, FREET4, T3FREE, THYROIDAB in the last 72 hours. Anemia Panel: No results for input(s): VITAMINB12, FOLATE, FERRITIN, TIBC, IRON, RETICCTPCT in the last 72 hours. Urine analysis: No  results found for: COLORURINE, APPEARANCEUR, LABSPEC, PHURINE, GLUCOSEU, HGBUR, BILIRUBINUR, KETONESUR, PROTEINUR, UROBILINOGEN, NITRITE, LEUKOCYTESUR No results found for this or any previous visit (from the past 240 hour(s)).    Radiology Studies: Ct Abdomen Pelvis Wo Contrast  Result Date: 05/02/2016 CLINICAL DATA:  66 year old male with abdominal pain and known ventral hernia. Patient complaining of shortness of breath and dizziness. EXAM: CT ABDOMEN AND PELVIS WITHOUT CONTRAST TECHNIQUE: Multidetector CT imaging of the abdomen and pelvis was performed following the standard protocol without IV contrast. COMPARISON:  None. FINDINGS: Evaluation of this exam is limited in the absence of intravenous contrast. Lower chest: The visualized lung bases are clear. There is hypoattenuation of the cardiac blood pool suggestive of a degree of anemia. Clinical correlation is recommended. No intra-abdominal free air.  Small ascites. Hepatobiliary: Morphologic changes of cirrhosis with surface irregularity and caudate lobe hypertrophy. The gallbladder is distended.  High attenuating content within the gallbladder likely represents vicarious excretion of intravenous contrast from recent CT study. Small stones or sludge is not excluded. There is diffuse thickened appearance of the gallbladder wall likely related to cirrhosis and portal hypertension. Ultrasound may provide better evaluation of the gallbladder if there is clinical concern for acute cholecystitis. Pancreas: Unremarkable. No pancreatic ductal dilatation or surrounding inflammatory changes. Spleen: Splenomegaly measuring up to 16 cm in the longest dimension. Adrenals/Urinary Tract: The right adrenal gland appears unremarkable. There is focal nodularity of the left adrenal gland which is indeterminate. The kidneys, visualized ureters, and urinary bladder appear unremarkable. Stomach/Bowel: There is diffuse thickened appearance of the colon as well as thickened  and edematous appearance of multiple loops of small bowel most likely related to hepatic enteropathy. Enterocolitis is not entirely excluded. Clinical correlation is recommended. There is no evidence of bowel obstruction. Normal appendix. Vascular/Lymphatic: Evaluation of the vasculature is limited in the absence of intravenous contrast. There is moderate aortoiliac atherosclerotic disease. No aneurysmal dilatation. No portal venous gas identified. Upper abdominal varices including gastroesophageal and perisplenic varices noted. Mildly enlarged aortocaval lymph node measures 14 mm in short axis. Reproductive: The prostate and seminal vesicles are grossly unremarkable. Other: There is a 3 cm peritoneal defect at the level of the umbilicus with herniation of the omental fat. No bowel herniation. There is stranding of the herniated fat with small amount of fluid in the hernia sac likely related to ascites. Strangulated hernia is not entirely excluded. Clinical correlation is recommended. Musculoskeletal: There is multilevel degenerative changes of the spine with endplate irregularity and disc desiccation and vacuum phenomena. No acute fracture. IMPRESSION: 1. Decompensated cirrhosis with evidence of portal hypertension including splenomegaly, ascites, and upper abdominal varices. 2. High attenuating content within the gallbladder likely millicuries excretion of intravenous contrast versus less likely gallbladder sludge and small stones. Diffusely thickened gallbladder wall likely related to cirrhosis and portal hypertension. Ultrasound may provide better evaluation of the gallbladder if there is clinical concern for acute cholecystitis. 3. Thickened and edematous loops of small bowel and colon likely related to hepatic enteropathy. Intra colitis is less likely but not excluded. Clinical correlation is recommended. No bowel obstruction. Normal appendix. 4. Small fat containing umbilical hernia. Stranding of the  herniated fat as well as small amount of fluid within the hernia likely extension of ascites. Correlation with clinical exam is recommended to evaluate for incarceration/strangulation of the hernia. Electronically Signed   By: Elgie Collard M.D.   On: 05/02/2016 02:56   Ct Head Wo Contrast  Result Date: 05/01/2016 CLINICAL DATA:  Dizziness. Headache. Back pain. Syncope. Personal history of liver cirrhosis. EXAM: CT HEAD WITHOUT CONTRAST TECHNIQUE: Contiguous axial images were obtained from the base of the skull through the vertex without intravenous contrast. COMPARISON:  None. FINDINGS: Brain: Mild atrophy and white matter changes are present. Basal ganglia are intact. No acute or focal infarct is evident. Ventricles are of normal size. No significant axial fluid collection is present. Vascular: Atherosclerotic calcifications are present within the cavernous internal carotid arteries bilaterally. There is no hyperdense vessel. Skull: The calvarium is within normal limits. No focal lytic or blastic lesions are present. Sinuses/Orbits: Mild mucosal thickening is present in the maxillary sinuses, left greater than right. Wall thickening suggest chronic disease. The remaining paranasal sinuses and mastoid air cells are clear. A high-riding jugular bulb is present on the right. The globes and orbits are within normal limits. IMPRESSION: 1. Mild atrophy and white matter disease.  2. Atherosclerosis. 3. No acute intracranial abnormality. Electronically Signed   By: Marin Robertshristopher  Mattern M.D.   On: 05/01/2016 15:37   Ct Angio Chest Pe W And/or Wo Contrast  Result Date: 05/01/2016 CLINICAL DATA:  Dizziness.  Chest pain shortness of breath. EXAM: CT ANGIOGRAPHY CHEST WITH CONTRAST TECHNIQUE: Multidetector CT imaging of the chest was performed using the standard protocol during bolus administration of intravenous contrast. Multiplanar CT image reconstructions and MIPs were obtained to evaluate the vascular anatomy.  CONTRAST:  80 mL Isovue 370 COMPARISON:  None. FINDINGS: Cardiovascular: The heart size is normal. Pulmonary arterial opacification is satisfactory. No focal filling defects are present to suggest pulmonary embolus. Minimal atherosclerotic calcifications are present in the aorta. There is no aneurysm. Great vessel origins are within normal limits. Mediastinum/Nodes: No significant mediastinal adenopathy is present. The esophagus is within normal limits. Lungs/Pleura: The lungs are clear. No focal nodule, mass, or airspace disease is present. There is no significant pleural effusion. Mild dependent atelectasis is present. Upper Abdomen: Cirrhotic changes are present in the liver. The spleen is enlarged. Stomach is unremarkable. A small amount of free fluid is evident adjacent to the liver. No discrete hepatic lesions are present. Musculoskeletal: More remote present. Vertebral body heights alignment are otherwise normal. Ribs are within normal limits. Bilateral gynecomastia is evident. Review of the MIP images confirms the above findings. IMPRESSION: 1. No pulmonary embolus. 2. Hepatic cirrhosis with mild ascites. 3. Associated splenomegaly. 4. Bilateral gynecomastia. Electronically Signed   By: Marin Robertshristopher  Mattern M.D.   On: 05/01/2016 18:34    Scheduled Meds: . cefTRIAXone (ROCEPHIN)  IV  2 g Intravenous Q0600  . [START ON 05/05/2016] pantoprazole  40 mg Intravenous Q12H  . thiamine injection  100 mg Intravenous Daily   Continuous Infusions: . sodium chloride 50 mL/hr at 05/01/16 2334  . octreotide  (SANDOSTATIN)    IV infusion 50 mcg/hr (05/01/16 2336)  . pantoprozole (PROTONIX) infusion 8 mg/hr (05/02/16 0032)     LOS: 1 day   Time spent: 25 minutes.  Hazeline Junkeryan Mercedes Valeriano, MD [facility] Pager [phone]  If 7PM-7AM, please contact night-coverage [email or web address] Password [email or web address] 05/02/2016, 7:09 AM

## 2016-05-02 NOTE — Progress Notes (Signed)
Pharmacy Antibiotic Note  Stephen Carney is a 66 y.o. male admitted on 05/01/2016 with intra-abd infection. Pt with known cirrhosis  Pharmacy has been consulted for Rocephin dosing.  Plan: Rocephin 2gm IV q24h Pharmacy will sign off - please reconsult if needed  Height: 5\' 9"  (175.3 cm) Weight: 234 lb 8 oz (106.4 kg) IBW/kg (Calculated) : 70.7  Temp (24hrs), Avg:97.9 F (36.6 C), Min:97.5 F (36.4 C), Max:98.3 F (36.8 C)   Recent Labs Lab 05/01/16 1509 05/01/16 1951 05/01/16 2205 05/02/16 0251  WBC 3.9* 4.3 4.1 4.8  CREATININE 0.72  --   --   --     Estimated Creatinine Clearance: 113.6 mL/min (by C-G formula based on SCr of 0.72 mg/dL).    No Known Allergies  Antimicrobials this admission: 3/12 Rocephin >>   Microbiology results:   Thank you for allowing pharmacy to be a part of this patient's care.  Christoper Fabianaron [place] Behlke, PharmD, BCPS Clinical pharmacist, pager [phone] 05/02/2016 6:15 AM

## 2016-05-02 NOTE — Consult Note (Addendum)
Referring Provider:  [facility] Primary Care Physician:  No PCP Per Patient Primary Gastroenterologist:  In [facility] [place]./Unassigned  Reason for Consultation:  Blood in the stool.  HPI: Stephen Carney is a 67 y.o. male past medical history of cirrhosis probably from alcohol use, history of esophageal varices requiring band ligation in October 2017 came into the hospital with not feeling good. Patient was on his way to [facility] for job interview while on the bus he started feeling weakness and dizziness. Was seen by EMT and was brought in here for further evaluation. Patient complaining of small amount of bright blood in the stool as well as dark stools. Attending of periumbilical discomfort. Denied any vomiting. Denied any fever or chills. Had last bowel movement this morning according to patient which was darker in color.  Patient has a previous GI workup done in  [place] and is scheduled for repeat EGD in April 2018. He Is not sure about the date of last colonoscopy.   Past Medical History:  Diagnosis Date  . Liver cirrhosis (HCC)     History reviewed. No pertinent surgical history.  Prior to Admission medications   Medication Sig Start Date End Date Taking? Authorizing Provider  Ascorbic Acid (VITAMIN C PO) Take 1 tablet by mouth daily.   Yes Historical Provider, MD  Cyanocobalamin (VITAMIN B-12 PO) Take 1 tablet by mouth daily.   Yes Historical Provider, MD  furosemide (LASIX) 40 MG tablet Take 40 mg by mouth daily as needed for fluid or edema.   Yes Historical Provider, MD  Naproxen Sod-Diphenhydramine (ALEVE PM PO) Take 1 tablet by mouth at bedtime as needed (sleep).   Yes Historical Provider, MD  PRESCRIPTION MEDICATION Inhale 1 puff into the lungs every 6 (six) hours as needed (shortness of breath/wheezing). Unknown inhaler   Yes Historical Provider, MD    Scheduled Meds: . cefTRIAXone (ROCEPHIN)  IV  2 g Intravenous Q0600  . [START ON 05/05/2016] pantoprazole  40  mg Intravenous Q12H  . thiamine injection  100 mg Intravenous Daily   Continuous Infusions: . sodium chloride 50 mL/hr at 05/01/16 2334  . octreotide  (SANDOSTATIN)    IV infusion 50 mcg/hr (05/01/16 2336)  . pantoprozole (PROTONIX) infusion 8 mg/hr (05/02/16 0032)   PRN Meds:.acetaminophen **OR** acetaminophen, morphine injection, ondansetron **OR** ondansetron (ZOFRAN) IV  Allergies as of 05/01/2016  . (No Known Allergies)    Family History  Problem Relation Age of Onset  . Cirrhosis Neg Hx     Social History   Social History  . Marital status: Single    Spouse name: N/A  . Number of children: N/A  . Years of education: N/A   Occupational History  . Not on file.   Social History Main Topics  . Smoking status: Former Games developer  . Smokeless tobacco: Never Used  . Alcohol use No     Comment: quit drinking in october due to cirrhosis  . Drug use: No  . Sexual activity: Not on file   Other Topics Concern  . Not on file   Social History Narrative  . No narrative on file    Review of Systems: All negative except as stated above in HPI. Review of Systems  Constitutional: Negative for chills, fever and weight loss.  HENT: Negative for ear discharge, ear pain and hearing loss.   Eyes: Negative for pain and discharge.  Respiratory: Negative for cough and hemoptysis.   Cardiovascular: Negative for chest pain and palpitations.  Gastrointestinal: Positive for abdominal  pain, blood in stool and nausea. Negative for vomiting.  Genitourinary: Negative for dysuria and urgency.  Musculoskeletal: Positive for back pain and myalgias.  Skin: Negative for rash.  Neurological: Negative for focal weakness and seizures.  Endo/Heme/Allergies: Negative for environmental allergies.  Psychiatric/Behavioral: Negative for hallucinations and suicidal ideas.    Physical Exam: Vital signs: Vitals:   05/01/16 2047 05/02/16 0624  BP: 114/71 92/64  Pulse: 90 94  Resp: 18 16  Temp: 98.3  F (36.8 C) 97.3 F (36.3 C)     General:  pleasant and cooperative in NAD HEENT : NS, AT , EOMI.  Sclerae. No icterus. Lungs:  Clear throughout to auscultation.   No wheezes, crackles, or rhonchi. No acute distress. Heart:  Regular rate and rhythm; no murmurs, clicks, rubs,  or gallops. Abdomen: Umbilical hernia with some tenderness in surrounding area. Soft, nondistended, bowel sounds present. Not able to palpate hepatosplenomegaly. LE: no edema or cyanosis  Psych : Alert and oriented 3. Not in acute distress. Mood and affect normal Rectal:  Deferred  GI:  Lab Results:  Recent Labs  05/01/16 2205 05/02/16 0251 05/02/16 0607  WBC 4.1 4.8 3.7*  HGB 8.0* 8.5* 8.0*  HCT 25.7* 26.9* 25.4*  PLT 33* 39* 32*   BMET  Recent Labs  05/01/16 1509 05/02/16 0607  NA 137 138  K 3.9 3.7  CL 107 107  CO2 26 27  GLUCOSE 138* 84  BUN 17 15  CREATININE 0.72 0.71  CALCIUM 7.3* 7.1*   LFT  Recent Labs  05/02/16 0607  PROT 5.4*  ALBUMIN 2.6*  AST 99*  ALT 67*  ALKPHOS 59  BILITOT 1.4*  BILIDIR 0.4  IBILI 1.0*   PT/INR No results for input(s): LABPROT, INR in the last 72 hours.   Studies/Results: Ct Abdomen Pelvis Wo Contrast  Result Date: 05/02/2016 CLINICAL DATA:  66 year old male with abdominal pain and known ventral hernia. Patient complaining of shortness of breath and dizziness. EXAM: CT ABDOMEN AND PELVIS WITHOUT CONTRAST TECHNIQUE: Multidetector CT imaging of the abdomen and pelvis was performed following the standard protocol without IV contrast. COMPARISON:  None. FINDINGS: Evaluation of this exam is limited in the absence of intravenous contrast. Lower chest: The visualized lung bases are clear. There is hypoattenuation of the cardiac blood pool suggestive of a degree of anemia. Clinical correlation is recommended. No intra-abdominal free air.  Small ascites. Hepatobiliary: Morphologic changes of cirrhosis with surface irregularity and caudate lobe hypertrophy.  The gallbladder is distended. High attenuating content within the gallbladder likely represents vicarious excretion of intravenous contrast from recent CT study. Small stones or sludge is not excluded. There is diffuse thickened appearance of the gallbladder wall likely related to cirrhosis and portal hypertension. Ultrasound may provide better evaluation of the gallbladder if there is clinical concern for acute cholecystitis. Pancreas: Unremarkable. No pancreatic ductal dilatation or surrounding inflammatory changes. Spleen: Splenomegaly measuring up to 16 cm in the longest dimension. Adrenals/Urinary Tract: The right adrenal gland appears unremarkable. There is focal nodularity of the left adrenal gland which is indeterminate. The kidneys, visualized ureters, and urinary bladder appear unremarkable. Stomach/Bowel: There is diffuse thickened appearance of the colon as well as thickened and edematous appearance of multiple loops of small bowel most likely related to hepatic enteropathy. Enterocolitis is not entirely excluded. Clinical correlation is recommended. There is no evidence of bowel obstruction. Normal appendix. Vascular/Lymphatic: Evaluation of the vasculature is limited in the absence of intravenous contrast. There is moderate aortoiliac atherosclerotic disease. No  aneurysmal dilatation. No portal venous gas identified. Upper abdominal varices including gastroesophageal and perisplenic varices noted. Mildly enlarged aortocaval lymph node measures 14 mm in short axis. Reproductive: The prostate and seminal vesicles are grossly unremarkable. Other: There is a 3 cm peritoneal defect at the level of the umbilicus with herniation of the omental fat. No bowel herniation. There is stranding of the herniated fat with small amount of fluid in the hernia sac likely related to ascites. Strangulated hernia is not entirely excluded. Clinical correlation is recommended. Musculoskeletal: There is multilevel degenerative  changes of the spine with endplate irregularity and disc desiccation and vacuum phenomena. No acute fracture. IMPRESSION: 1. Decompensated cirrhosis with evidence of portal hypertension including splenomegaly, ascites, and upper abdominal varices. 2. High attenuating content within the gallbladder likely millicuries excretion of intravenous contrast versus less likely gallbladder sludge and small stones. Diffusely thickened gallbladder wall likely related to cirrhosis and portal hypertension. Ultrasound may provide better evaluation of the gallbladder if there is clinical concern for acute cholecystitis. 3. Thickened and edematous loops of small bowel and colon likely related to hepatic enteropathy. Intra colitis is less likely but not excluded. Clinical correlation is recommended. No bowel obstruction. Normal appendix. 4. Small fat containing umbilical hernia. Stranding of the herniated fat as well as small amount of fluid within the hernia likely extension of ascites. Correlation with clinical exam is recommended to evaluate for incarceration/strangulation of the hernia. Electronically Signed   By: Elgie Collard M.D.   On: 05/02/2016 02:56   Ct Head Wo Contrast  Result Date: 05/01/2016 CLINICAL DATA:  Dizziness. Headache. Back pain. Syncope. Personal history of liver cirrhosis. EXAM: CT HEAD WITHOUT CONTRAST TECHNIQUE: Contiguous axial images were obtained from the base of the skull through the vertex without intravenous contrast. COMPARISON:  None. FINDINGS: Brain: Mild atrophy and white matter changes are present. Basal ganglia are intact. No acute or focal infarct is evident. Ventricles are of normal size. No significant axial fluid collection is present. Vascular: Atherosclerotic calcifications are present within the cavernous internal carotid arteries bilaterally. There is no hyperdense vessel. Skull: The calvarium is within normal limits. No focal lytic or blastic lesions are present. Sinuses/Orbits:  Mild mucosal thickening is present in the maxillary sinuses, left greater than right. Wall thickening suggest chronic disease. The remaining paranasal sinuses and mastoid air cells are clear. A high-riding jugular bulb is present on the right. The globes and orbits are within normal limits. IMPRESSION: 1. Mild atrophy and white matter disease. 2. Atherosclerosis. 3. No acute intracranial abnormality. Electronically Signed   By: Marin Roberts M.D.   On: 05/01/2016 15:37   Ct Angio Chest Pe W And/or Wo Contrast  Result Date: 05/01/2016 CLINICAL DATA:  Dizziness.  Chest pain shortness of breath. EXAM: CT ANGIOGRAPHY CHEST WITH CONTRAST TECHNIQUE: Multidetector CT imaging of the chest was performed using the standard protocol during bolus administration of intravenous contrast. Multiplanar CT image reconstructions and MIPs were obtained to evaluate the vascular anatomy. CONTRAST:  80 mL Isovue 370 COMPARISON:  None. FINDINGS: Cardiovascular: The heart size is normal. Pulmonary arterial opacification is satisfactory. No focal filling defects are present to suggest pulmonary embolus. Minimal atherosclerotic calcifications are present in the aorta. There is no aneurysm. Great vessel origins are within normal limits. Mediastinum/Nodes: No significant mediastinal adenopathy is present. The esophagus is within normal limits. Lungs/Pleura: The lungs are clear. No focal nodule, mass, or airspace disease is present. There is no significant pleural effusion. Mild dependent atelectasis is present. Upper  Abdomen: Cirrhotic changes are present in the liver. The spleen is enlarged. Stomach is unremarkable. A small amount of free fluid is evident adjacent to the liver. No discrete hepatic lesions are present. Musculoskeletal: More remote present. Vertebral body heights alignment are otherwise normal. Ribs are within normal limits. Bilateral gynecomastia is evident. Review of the MIP images confirms the above findings.  IMPRESSION: 1. No pulmonary embolus. 2. Hepatic cirrhosis with mild ascites. 3. Associated splenomegaly. 4. Bilateral gynecomastia. Electronically Signed   By: Marin Roberts M.D.   On: 05/01/2016 18:34    Impression/Plan: - Blood in the stool with some bright blood and dark stools in patient with history of cirrhosis and esophageal varices. Need to rule out esophageal variceal bleed. 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 Stopped drinking in October 2017. - ThromboCytopenia - AbNormal LFTs. Probably from underlying cirrhosis.  Recommendations -------------------------- - EGD tomorrow for further evaluation after platelet transfusion. - Patient's hemoglobin is stable. Normal BUN. Stable vitals.  - Okay to have clear liquid diet today. - Continue antibiotics/octreotide/PPI  - Recommend outpatient workup/Managment of cirrhosis  by primary gastroenterologist in [place]. - GI will follow.    LOS: 1 day   Kathi Der  MD, FACP 05/02/2016, 8:37 AM  Pager [phone] If no answer or after 5 PM call [phone]

## 2016-05-02 NOTE — Progress Notes (Signed)
  Echocardiogram 2D Echocardiogram has been performed.  Stephen Carney 05/02/2016, 10:08 AM

## 2016-05-03 ENCOUNTER — Encounter (HOSPITAL_COMMUNITY): Payer: Self-pay | Admitting: Certified Registered Nurse Anesthetist

## 2016-05-03 ENCOUNTER — Encounter (HOSPITAL_COMMUNITY)
Admission: EM | Disposition: A | Discharge status: Home / Self Care | Payer: Self-pay | Source: Home / Self Care | Attending: Family Medicine

## 2016-05-03 DIAGNOSIS — F1021 Alcohol dependence, in remission: Secondary | ICD-10-CM

## 2016-05-03 LAB — CBC
HCT: 25.9 % — ABNORMAL LOW (ref 39.0–52.0)
HEMATOCRIT: 26.3 % — AB (ref 39.0–52.0)
HEMOGLOBIN: 8.2 g/dL — AB (ref 13.0–17.0)
HEMOGLOBIN: 8.2 g/dL — AB (ref 13.0–17.0)
MCH: 26.5 pg (ref 26.0–34.0)
MCH: 27 pg (ref 26.0–34.0)
MCHC: 31.2 g/dL (ref 30.0–36.0)
MCHC: 31.7 g/dL (ref 30.0–36.0)
MCV: 85.1 fL (ref 78.0–100.0)
MCV: 85.2 fL (ref 78.0–100.0)
Platelets: 42 10*3/uL — ABNORMAL LOW (ref 150–400)
Platelets: 41 10*3/uL — ABNORMAL LOW (ref 150–400)
RBC: 3.09 MIL/uL — ABNORMAL LOW (ref 4.22–5.81)
RBC: 3.04 MIL/uL — ABNORMAL LOW (ref 4.22–5.81)
RDW: 19.5 % — ABNORMAL HIGH (ref 11.5–15.5)
RDW: 19.5 % — ABNORMAL HIGH (ref 11.5–15.5)
WBC: 3.3 10*3/uL — ABNORMAL LOW (ref 4.0–10.5)
WBC: 3.3 10*3/uL — ABNORMAL LOW (ref 4.0–10.5)

## 2016-05-03 LAB — BPAM RBC
Blood Product Expiration Date: 201803302359
ISSUE DATE / TIME: 201803122354
Unit Type and Rh: 6200

## 2016-05-03 LAB — TYPE AND SCREEN
ABO/RH(D): A POS
Antibody Screen: NEGATIVE
UNIT DIVISION: 0

## 2016-05-03 LAB — HEPATITIS PANEL, ACUTE
HEP A IGM: NEGATIVE
HEP B S AG: NEGATIVE
Hep B C IgM: NEGATIVE

## 2016-05-03 LAB — FERRITIN: Ferritin: 22 ng/mL — ABNORMAL LOW (ref 24–336)

## 2016-05-03 LAB — PROTIME-INR
INR: 1.4
PROTHROMBIN TIME: 17.2 s — AB (ref 11.4–15.2)

## 2016-05-03 LAB — GLUCOSE, CAPILLARY
GLUCOSE-CAPILLARY: 102 mg/dL — AB (ref 65–99)
GLUCOSE-CAPILLARY: 151 mg/dL — AB (ref 65–99)
Glucose-Capillary: 91 mg/dL (ref 65–99)

## 2016-05-03 LAB — IRON AND TIBC
Iron: 161 ug/dL (ref 45–182)
SATURATION RATIOS: 55 % — AB (ref 17.9–39.5)
TIBC: 293 ug/dL (ref 250–450)
UIBC: 132 ug/dL

## 2016-05-03 LAB — RETICULOCYTES
RBC.: 3.09 MIL/uL — ABNORMAL LOW (ref 4.22–5.81)
Retic Count, Absolute: 92.7 10*3/uL (ref 19.0–186.0)
Retic Ct Pct: 3 % (ref 0.4–3.1)

## 2016-05-03 LAB — AMMONIA: Ammonia: 131 umol/L — ABNORMAL HIGH (ref 9–35)

## 2016-05-03 LAB — VITAMIN B12: Vitamin B-12: 1713 pg/mL — ABNORMAL HIGH (ref 180–914)

## 2016-05-03 LAB — FOLATE: Folate: 15.2 ng/mL (ref >5.9)

## 2016-05-03 SURGERY — CANCELLED PROCEDURE

## 2016-05-03 MED ORDER — SODIUM CHLORIDE 0.9 % IV SOLN
Freq: Once | INTRAVENOUS | Status: AC
  Administered 2016-05-03: 02:00:00 via INTRAVENOUS

## 2016-05-03 MED ORDER — MORPHINE SULFATE (PF) 2 MG/ML IV SOLN
2.0000 mg | INTRAVENOUS | Status: DC | PRN
  Administered 2016-05-03 – 2016-05-05 (×6): 2 mg via INTRAVENOUS
  Filled 2016-05-03 (×6): qty 1

## 2016-05-03 MED ORDER — ZOLPIDEM TARTRATE 5 MG PO TABS
5.0000 mg | ORAL_TABLET | Freq: Every evening | ORAL | Status: DC | PRN
  Administered 2016-05-03 – 2016-05-04 (×2): 5 mg via ORAL
  Filled 2016-05-03 (×2): qty 1

## 2016-05-03 MED ORDER — SODIUM CHLORIDE 0.9 % IV SOLN
INTRAVENOUS | Status: DC
  Administered 2016-05-03: 11:00:00 via INTRAVENOUS

## 2016-05-03 MED ORDER — SODIUM CHLORIDE 0.9 % IV SOLN: Freq: Once | INTRAVENOUS | Status: DC

## 2016-05-03 NOTE — Progress Notes (Signed)
Patient admitted to Endo unit, Patient's lab results reviewed with MD and platelets level to low for procedure. Patient will be rescheduled for tomorrow (3.14.18). Report called to bedside RN.

## 2016-05-03 NOTE — Progress Notes (Signed)
Received call from CCMD telling me patient is assytole,went immediately to patient's room.Patient was in prone position in bed,called his name but not no responsed.Nurse vigorously  tapped his shoulder and at the same time called his name.Patient awakened from his deep slumber,become upset with this.Nursed apologized and explained that  you are not responding for initial voices stimulations and it is for his safety,checking him if he is still having heart rate or not.Patient was very upset for being awakened from deep sleep.He requested to have a Press photographerCharge Nurse.

## 2016-05-03 NOTE — Progress Notes (Addendum)
PROGRESS NOTE  Stephen Carney  WUJ:[id number] DOB: 02-Apr-1950 DOA: 05/01/2016 PCP: No PCP Per Patient   Brief Narrative: Stephen Carney is a 66 y.o. male with a history of alcohol abuse and cirrhosis of the liver who has had previous esophageal variceal banding (last GI bleed in Oct 2017) who was traveling by bus from home near [place], [place] to a job interview in [place], [place] when he had a syncopal event. EMS was called and on arrival to the ED, he demonstrated frank blood in stool. The patient reported diffuse abdominal pain, chest pain, and dizziness as well as intermittent naproxen use, and nonadherence to lasix, spironolactone and xifaxan.  On arrival, BP 112/77, HR 85, orthostatics negative. Hgb 8.5 > 8.0 and platelets 38 > 32 (unknown baseline of both). Troponin negative. CTA chest was negative for PE but did show features concerning for cirrhosis of the liver. CT abd/pelvis showed decompensated cirrhosis with ascites and fat-containing umbilical hernia. CT head was unremarkable. ECG showed NSR w/ QTc of 479 ms. He was admitted for syncope due to lower GI bleeding. GI was consulted and is planning EGD 3/14 if platelets >50k. Monitor Hgb: 8.5 > 8 > 8.5 > 8 > 7.6 > 1u PRBCs > 8.2.   Assessment & Plan: Principal Problem:   Acute GI bleeding Active Problems:   Acute blood loss anemia   Syncope   Decompensated hepatic cirrhosis (HCC)   Ascites   History of alcoholism (HCC)  Acute blood loss anemia due to acute GI bleeding: Dondra Prader in ED now becoming melanotic. Hgb 7.6 > 8.2 after 1u PRBCs 3/12, plt 31 > 42 with plt's 3/13. Will need plt > 50k prior to EGD. Reschedule for 3/14. CECT abdomen showed decompensated cirrhosis, ascites, abdominal varices, hepatic enteropathy and small fat-containing umbilical hernia. - Clear liquids, NPO p MN for EGD 3/14 - Continue octreotide and PPI gtt's - Empiric abx given h/o cirrhosis - Recheck CBC in AM unless clinical bleeding resumes. Unknown  baseline. Transfuse < 8 with active bleeding. - Anemia panel ordered  Syncope: Due to abrupt GI bleeding/anemia. Negative orthostatics after IV fluids in ID. QTc . Echocardiogram showed EF 55-60%, mild LVH, no RWMA's, normal valves. - Continue telemetry, no pauses.  Cirrhosis of the liver: Likely from alcohol abuse and hepatitis (reported by pt, unknown type). Elevated transaminases with ascites.  - Acute hepatitis panel has been ordered.  - Ammonia 131, restart xifaxan once bleeding reliably resolved. - Empiric CTX for GI bleed. - Holding lasix, spironolactone  Thrombocytopenia: Presumed chronic related to cirrhosis/splenomegaly.  - Monitor, still below 50k, so delaying EGD to 3/14, transfuse in AM. - SCDs  Leukopenia: No evidence of sepsis.  - Check Diff in AM  History of alcoholism: patient states she has not had any alcohol since October 2017. +THC on UDS.  - Monitor. No evidence of withdrawal. - On thiamine  DVT prophylaxis: SCDs Code Status: Full Family Communication: None at bedside Disposition Plan: Continue inpatient work up, EGD 3/14, anticipate eventual DC to home.  Consultants:   Gastroenterology.   Procedures:   None  Antimicrobials:  Ceftriaxone 3/11 >>    Subjective: 6 - 7 dark stools in the past 24 hours, red blood is resolving. No dizziness/light-headedness, chest pain, dyspnea.  Objective: Vitals:   05/03/16 0525 05/03/16 0625 05/03/16 0951 05/03/16 1015  BP: (!) 94/56 105/65 112/73 121/68  Pulse: 87 89 89 90  Resp: 14 12 14 15   Temp: 98.6 F (37 C) 98.7 F (37.1 C)  98.1 F (36.7 C) 97.9 F (36.6 C)  TempSrc: Oral Oral Oral Oral  SpO2: 97% 95% 92% 94%  Weight:      Height:        Intake/Output Summary (Last 24 hours) at 05/03/16 1132 Last data filed at 05/03/16 1033  Gross per 24 hour  Intake          3429.33 ml  Output              775 ml  Net          2654.33 ml   Filed Weights   05/01/16 1440 05/01/16 2047 05/02/16 2200    Weight: 102.1 kg (225 lb) 106.4 kg (234 lb 8 oz) 108.9 kg (240 lb)    Examination: General exam: Chronically ill-appearing 66 y.o. male in no distress  Respiratory system: Non-labored breathing ambient air. Clear to auscultation bilaterally.  Cardiovascular system: Regular rate and rhythm. No murmur, rub, or gallop. No JVD, and no pedal edema. Gastrointestinal system: Abdomen soft, mildly distended w/fluid wave. Umbilical hernia reducible with mild tenderness. +BS. No hepatosplenomegaly palpated. Central nervous system: Alert and oriented. No focal neurological deficits. Extremities: Warm, no deformities. Skin: No rashes, lesions ulcers. Diffuse tattoos.  Psychiatry: Judgement and insight appear normal. Mood & affect appropriate.   Data Reviewed: I have personally reviewed following labs and imaging studies  CBC:  Recent Labs Lab 05/01/16 1509 05/01/16 1951  05/02/16 0251 05/02/16 0607 05/02/16 1644 05/03/16 0546 05/03/16 0931  WBC 3.9* 4.3  < > 4.8 3.7* 2.8* 3.3* 3.3*  NEUTROABS 2.7 2.8  --   --   --   --   --   --   HGB 8.7* 8.5*  < > 8.5* 8.0* 7.6* 8.2* 8.2*  HCT 27.5* 26.8*  < > 26.9* 25.4* 24.3* 26.3* 25.9*  MCV 84.4 84.5  < > 83.8 83.8 85.0 85.1 85.2  PLT 32* 38*  < > 39* 32* 31* 42* 41*  < > = values in this interval not displayed. Basic Metabolic Panel:  Recent Labs Lab 05/01/16 1509 05/02/16 0607  NA 137 138  K 3.9 3.7  CL 107 107  CO2 26 27  GLUCOSE 138* 84  BUN 17 15  CREATININE 0.72 0.71  CALCIUM 7.3* 7.1*   GFR: Estimated Creatinine Clearance: 115 mL/min (by C-G formula based on SCr of 0.71 mg/dL). Liver Function Tests:  Recent Labs Lab 05/01/16 1509 05/02/16 0607  AST 101* 99*  ALT 69* 67*  ALKPHOS 72 59  BILITOT 1.2 1.4*  PROT 5.3* 5.4*  ALBUMIN 2.6* 2.6*    Recent Labs Lab 05/01/16 1509  LIPASE 69*    Recent Labs Lab 05/03/16 0546  AMMONIA 131*   Coagulation Profile:  Recent Labs Lab 05/03/16 0546  INR 1.40   Cardiac  Enzymes:  Recent Labs Lab 05/01/16 2205  TROPONINI <0.03   BNP (last 3 results) No results for input(s): PROBNP in the last 8760 hours. HbA1C: No results for input(s): HGBA1C in the last 72 hours. CBG:  Recent Labs Lab 05/02/16 1137 05/02/16 2157  GLUCAP 192* 126*   Lipid Profile: No results for input(s): CHOL, HDL, LDLCALC, TRIG, CHOLHDL, LDLDIRECT in the last 72 hours. Thyroid Function Tests: No results for input(s): TSH, T4TOTAL, FREET4, T3FREE, THYROIDAB in the last 72 hours. Anemia Panel:  Recent Labs  05/03/16 0546  VITAMINB12 1,713*  FOLATE 15.2  FERRITIN 22*  TIBC 293  IRON 161  RETICCTPCT 3.0   Urine analysis: No results found for:  COLORURINE, APPEARANCEUR, LABSPEC, PHURINE, GLUCOSEU, HGBUR, BILIRUBINUR, KETONESUR, PROTEINUR, UROBILINOGEN, NITRITE, LEUKOCYTESUR No results found for this or any previous visit (from the past 240 hour(s)).    Radiology Studies: Ct Abdomen Pelvis Wo Contrast  Result Date: 05/02/2016 CLINICAL DATA:  66 year old male with abdominal pain and known ventral hernia. Patient complaining of shortness of breath and dizziness. EXAM: CT ABDOMEN AND PELVIS WITHOUT CONTRAST TECHNIQUE: Multidetector CT imaging of the abdomen and pelvis was performed following the standard protocol without IV contrast. COMPARISON:  None. FINDINGS: Evaluation of this exam is limited in the absence of intravenous contrast. Lower chest: The visualized lung bases are clear. There is hypoattenuation of the cardiac blood pool suggestive of a degree of anemia. Clinical correlation is recommended. No intra-abdominal free air.  Small ascites. Hepatobiliary: Morphologic changes of cirrhosis with surface irregularity and caudate lobe hypertrophy. The gallbladder is distended. High attenuating content within the gallbladder likely represents vicarious excretion of intravenous contrast from recent CT study. Small stones or sludge is not excluded. There is diffuse thickened  appearance of the gallbladder wall likely related to cirrhosis and portal hypertension. Ultrasound may provide better evaluation of the gallbladder if there is clinical concern for acute cholecystitis. Pancreas: Unremarkable. No pancreatic ductal dilatation or surrounding inflammatory changes. Spleen: Splenomegaly measuring up to 16 cm in the longest dimension. Adrenals/Urinary Tract: The right adrenal gland appears unremarkable. There is focal nodularity of the left adrenal gland which is indeterminate. The kidneys, visualized ureters, and urinary bladder appear unremarkable. Stomach/Bowel: There is diffuse thickened appearance of the colon as well as thickened and edematous appearance of multiple loops of small bowel most likely related to hepatic enteropathy. Enterocolitis is not entirely excluded. Clinical correlation is recommended. There is no evidence of bowel obstruction. Normal appendix. Vascular/Lymphatic: Evaluation of the vasculature is limited in the absence of intravenous contrast. There is moderate aortoiliac atherosclerotic disease. No aneurysmal dilatation. No portal venous gas identified. Upper abdominal varices including gastroesophageal and perisplenic varices noted. Mildly enlarged aortocaval lymph node measures 14 mm in short axis. Reproductive: The prostate and seminal vesicles are grossly unremarkable. Other: There is a 3 cm peritoneal defect at the level of the umbilicus with herniation of the omental fat. No bowel herniation. There is stranding of the herniated fat with small amount of fluid in the hernia sac likely related to ascites. Strangulated hernia is not entirely excluded. Clinical correlation is recommended. Musculoskeletal: There is multilevel degenerative changes of the spine with endplate irregularity and disc desiccation and vacuum phenomena. No acute fracture. IMPRESSION: 1. Decompensated cirrhosis with evidence of portal hypertension including splenomegaly, ascites, and upper  abdominal varices. 2. High attenuating content within the gallbladder likely millicuries excretion of intravenous contrast versus less likely gallbladder sludge and small stones. Diffusely thickened gallbladder wall likely related to cirrhosis and portal hypertension. Ultrasound may provide better evaluation of the gallbladder if there is clinical concern for acute cholecystitis. 3. Thickened and edematous loops of small bowel and colon likely related to hepatic enteropathy. Intra colitis is less likely but not excluded. Clinical correlation is recommended. No bowel obstruction. Normal appendix. 4. Small fat containing umbilical hernia. Stranding of the herniated fat as well as small amount of fluid within the hernia likely extension of ascites. Correlation with clinical exam is recommended to evaluate for incarceration/strangulation of the hernia. Electronically Signed   By: Elgie CollardArash  Radparvar M.D.   On: 05/02/2016 02:56   Ct Head Wo Contrast  Result Date: 05/01/2016 CLINICAL DATA:  Dizziness. Headache. Back pain. Syncope.  Personal history of liver cirrhosis. EXAM: CT HEAD WITHOUT CONTRAST TECHNIQUE: Contiguous axial images were obtained from the base of the skull through the vertex without intravenous contrast. COMPARISON:  None. FINDINGS: Brain: Mild atrophy and white matter changes are present. Basal ganglia are intact. No acute or focal infarct is evident. Ventricles are of normal size. No significant axial fluid collection is present. Vascular: Atherosclerotic calcifications are present within the cavernous internal carotid arteries bilaterally. There is no hyperdense vessel. Skull: The calvarium is within normal limits. No focal lytic or blastic lesions are present. Sinuses/Orbits: Mild mucosal thickening is present in the maxillary sinuses, left greater than right. Wall thickening suggest chronic disease. The remaining paranasal sinuses and mastoid air cells are clear. A high-riding jugular bulb is present  on the right. The globes and orbits are within normal limits. IMPRESSION: 1. Mild atrophy and white matter disease. 2. Atherosclerosis. 3. No acute intracranial abnormality. Electronically Signed   By: Marin Roberts M.D.   On: 05/01/2016 15:37   Ct Angio Chest Pe W And/or Wo Contrast  Result Date: 05/01/2016 CLINICAL DATA:  Dizziness.  Chest pain shortness of breath. EXAM: CT ANGIOGRAPHY CHEST WITH CONTRAST TECHNIQUE: Multidetector CT imaging of the chest was performed using the standard protocol during bolus administration of intravenous contrast. Multiplanar CT image reconstructions and MIPs were obtained to evaluate the vascular anatomy. CONTRAST:  80 mL Isovue 370 COMPARISON:  None. FINDINGS: Cardiovascular: The heart size is normal. Pulmonary arterial opacification is satisfactory. No focal filling defects are present to suggest pulmonary embolus. Minimal atherosclerotic calcifications are present in the aorta. There is no aneurysm. Great vessel origins are within normal limits. Mediastinum/Nodes: No significant mediastinal adenopathy is present. The esophagus is within normal limits. Lungs/Pleura: The lungs are clear. No focal nodule, mass, or airspace disease is present. There is no significant pleural effusion. Mild dependent atelectasis is present. Upper Abdomen: Cirrhotic changes are present in the liver. The spleen is enlarged. Stomach is unremarkable. A small amount of free fluid is evident adjacent to the liver. No discrete hepatic lesions are present. Musculoskeletal: More remote present. Vertebral body heights alignment are otherwise normal. Ribs are within normal limits. Bilateral gynecomastia is evident. Review of the MIP images confirms the above findings. IMPRESSION: 1. No pulmonary embolus. 2. Hepatic cirrhosis with mild ascites. 3. Associated splenomegaly. 4. Bilateral gynecomastia. Electronically Signed   By: Marin Roberts M.D.   On: 05/01/2016 18:34    Scheduled Meds: .  cefTRIAXone (ROCEPHIN)  IV  2 g Intravenous Q0600  . [START ON 05/05/2016] pantoprazole  40 mg Intravenous Q12H  . thiamine injection  100 mg Intravenous Daily   Continuous Infusions: . octreotide  (SANDOSTATIN)    IV infusion 50 mcg/hr (05/02/16 2125)  . pantoprozole (PROTONIX) infusion 8 mg/hr (05/02/16 2125)     LOS: 2 days   Time spent: 25 minutes.  Hazeline Junker, MD [facility] Pager [phone]  If 7PM-7AM, please contact night-coverage [email or web address] Password [facility] 05/03/2016, 11:32 AM

## 2016-05-03 NOTE — Progress Notes (Signed)
[facility] Progress Note  Stephen LairRandolph Carney 66 y.o. 08/01/1950  CC:  GI bleed   Subjective: Patient complaining of dark stools now. Bright red blood per rectum has resolved. Denied abdominal pain, nausea, vomiting.  ROS : Positive for bleeding. Negative for nausea and vomiting.   Objective: Vital signs in last 24 hours: Vitals:   05/03/16 0951 05/03/16 1015  BP: 112/73 121/68  Pulse: 89 90  Resp: 14 15  Temp: 98.1 F (36.7 C) 97.9 F (36.6 C)    Physical Exam:  General:  Alert, cooperative, no distress, appears stated age  Head:  Normocephalic, without obvious abnormality, atraumatic  Eyes:  , EOM's intact,   Lungs:   Clear to auscultation bilaterally, respirations unlabored  Heart:  Regular rate and rhythm, S1, S2 normal  Abdomen:   Soft, non-tender, bowel sounds active all four quadrants,  no masses,   Extremities: Extremities normal, atraumatic, no  edema  Pulses: 2+ and symmetric    Lab Results:  Recent Labs  05/01/16 1509 05/02/16 0607  NA 137 138  K 3.9 3.7  CL 107 107  CO2 26 27  GLUCOSE 138* 84  BUN 17 15  CREATININE 0.72 0.71  CALCIUM 7.3* 7.1*    Recent Labs  05/01/16 1509 05/02/16 0607  AST 101* 99*  ALT 69* 67*  ALKPHOS 72 59  BILITOT 1.2 1.4*  PROT 5.3* 5.4*  ALBUMIN 2.6* 2.6*    Recent Labs  05/01/16 1509 05/01/16 1951  05/03/16 0546 05/03/16 0931  WBC 3.9* 4.3  < > 3.3* 3.3*  NEUTROABS 2.7 2.8  --   --   --   HGB 8.7* 8.5*  < > 8.2* 8.2*  HCT 27.5* 26.8*  < > 26.3* 25.9*  MCV 84.4 84.5  < > 85.1 85.2  PLT 32* 38*  < > 42* 41*  < > = values in this interval not displayed.  Recent Labs  05/03/16 0546  LABPROT 17.2*  INR 1.40      Assessment/Plan: - Blood in the stool with some bright blood and dark stools in patient with history of cirrhosis and esophageal varices. Need to rule out esophageal variceal bleed. 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 Stopped drinking in October 2017. - ThromboCytopenia -  AbNormal LFTs. Probably from underlying cirrhosis.  Recommendations -------------------------- - Patient's platelets remains low despite of platelet transfusion. Patient's hemoglobin is stable. Plan for EGD tomorrow after 2 more units of platelet transfusion in the morning. Normal BUN.  - Okay to have full  liquid diet today. - Continue antibiotics/octreotide/PPI  - Recommend outpatient workup/Managment of cirrhosis  by [place] in [place]. - GI will follow.  Kathi DerParag Norita Meigs MD, FACP 05/03/2016, 10:26 AM  Pager [phone]  If no answer or after 5 PM call [phone]

## 2016-05-04 ENCOUNTER — Inpatient Hospital Stay (HOSPITAL_COMMUNITY): Payer: Self-pay | Admitting: Anesthesiology

## 2016-05-04 ENCOUNTER — Encounter (HOSPITAL_COMMUNITY)
Admission: EM | Disposition: A | Discharge status: Home / Self Care | Payer: Self-pay | Source: Home / Self Care | Attending: Family Medicine

## 2016-05-04 ENCOUNTER — Encounter (HOSPITAL_COMMUNITY): Payer: Self-pay | Admitting: *Deleted

## 2016-05-04 DIAGNOSIS — R109 Unspecified abdominal pain: Secondary | ICD-10-CM

## 2016-05-04 HISTORY — PX: ESOPHAGOGASTRODUODENOSCOPY (EGD) WITH PROPOFOL: SHX5813

## 2016-05-04 LAB — PREPARE PLATELET PHERESIS
Unit division: 0
Unit division: 0

## 2016-05-04 LAB — BPAM PLATELET PHERESIS
Blood Product Expiration Date: 201803152359
Blood Product Expiration Date: 201803152359
ISSUE DATE / TIME: 201803130240
ISSUE DATE / TIME: 201803130437
Unit Type and Rh: 6200
Unit Type and Rh: 6200

## 2016-05-04 LAB — CBC
HCT: 25.4 % — ABNORMAL LOW (ref 39.0–52.0)
HEMATOCRIT: 27 % — AB (ref 39.0–52.0)
HEMOGLOBIN: 8.2 g/dL — AB (ref 13.0–17.0)
HEMOGLOBIN: 8.5 g/dL — AB (ref 13.0–17.0)
MCH: 27.9 pg (ref 26.0–34.0)
MCH: 27 pg (ref 26.0–34.0)
MCHC: 32.3 g/dL (ref 30.0–36.0)
MCHC: 31.5 g/dL (ref 30.0–36.0)
MCV: 86.4 fL (ref 78.0–100.0)
MCV: 85.7 fL (ref 78.0–100.0)
PLATELETS: 44 10*3/uL — AB (ref 150–400)
Platelets: 40 10*3/uL — ABNORMAL LOW (ref 150–400)
RBC: 2.94 MIL/uL — AB (ref 4.22–5.81)
RBC: 3.15 MIL/uL — ABNORMAL LOW (ref 4.22–5.81)
RDW: 19.9 % — ABNORMAL HIGH (ref 11.5–15.5)
RDW: 19.8 % — AB (ref 11.5–15.5)
WBC: 3.4 10*3/uL — ABNORMAL LOW (ref 4.0–10.5)
WBC: 3.6 10*3/uL — AB (ref 4.0–10.5)

## 2016-05-04 LAB — GLUCOSE, CAPILLARY
GLUCOSE-CAPILLARY: 96 mg/dL (ref 65–99)
Glucose-Capillary: 87 mg/dL (ref 65–99)
Glucose-Capillary: 163 mg/dL — ABNORMAL HIGH (ref 65–99)

## 2016-05-04 LAB — AMMONIA: AMMONIA: 74 umol/L — AB (ref 9–35)

## 2016-05-04 LAB — MAGNESIUM: MAGNESIUM: 1.6 mg/dL — AB (ref 1.7–2.4)

## 2016-05-04 SURGERY — ESOPHAGOGASTRODUODENOSCOPY (EGD) WITH PROPOFOL
Anesthesia: General

## 2016-05-04 MED ORDER — LIDOCAINE HCL (CARDIAC) 20 MG/ML IV SOLN
INTRAVENOUS | Status: DC | PRN
  Administered 2016-05-04: 60 mg via INTRAVENOUS

## 2016-05-04 MED ORDER — PHENYLEPHRINE HCL 10 MG/ML IJ SOLN
INTRAMUSCULAR | Status: DC | PRN
  Administered 2016-05-04 (×2): 75 ug via INTRAVENOUS

## 2016-05-04 MED ORDER — PANTOPRAZOLE SODIUM 40 MG PO TBEC
40.0000 mg | DELAYED_RELEASE_TABLET | Freq: Every day | ORAL | Status: DC
Start: 2016-05-04 — End: 2016-05-05
  Administered 2016-05-04 – 2016-05-05 (×2): 40 mg via ORAL
  Filled 2016-05-04 (×2): qty 1

## 2016-05-04 MED ORDER — ALBUTEROL SULFATE HFA 108 (90 BASE) MCG/ACT IN AERS
INHALATION_SPRAY | RESPIRATORY_TRACT | Status: DC | PRN
  Administered 2016-05-04: 2 via RESPIRATORY_TRACT

## 2016-05-04 MED ORDER — SUCCINYLCHOLINE CHLORIDE 20 MG/ML IJ SOLN
INTRAMUSCULAR | Status: DC | PRN
  Administered 2016-05-04: 100 mg via INTRAVENOUS

## 2016-05-04 MED ORDER — PROPRANOLOL HCL 10 MG PO TABS
10.0000 mg | ORAL_TABLET | Freq: Two times a day (BID) | ORAL | Status: DC
  Administered 2016-05-04 – 2016-05-05 (×2): 10 mg via ORAL
  Filled 2016-05-04 (×4): qty 1

## 2016-05-04 MED ORDER — PROPOFOL 10 MG/ML IV BOLUS
INTRAVENOUS | Status: DC | PRN
  Administered 2016-05-04: 150 mg via INTRAVENOUS

## 2016-05-04 MED ORDER — FENTANYL CITRATE (PF) 100 MCG/2ML IJ SOLN
INTRAMUSCULAR | Status: DC | PRN
  Administered 2016-05-04: 100 ug via INTRAVENOUS

## 2016-05-04 MED ORDER — LACTATED RINGERS IV SOLN
INTRAVENOUS | Status: DC | PRN
  Administered 2016-05-04: 12:00:00 via INTRAVENOUS

## 2016-05-04 MED ORDER — MIDAZOLAM HCL 5 MG/5ML IJ SOLN
INTRAMUSCULAR | Status: DC | PRN
  Administered 2016-05-04: 2 mg via INTRAVENOUS

## 2016-05-04 MED ORDER — ALPRAZOLAM 0.5 MG PO TABS
0.5000 mg | ORAL_TABLET | Freq: Two times a day (BID) | ORAL | Status: DC | PRN
  Administered 2016-05-04 – 2016-05-05 (×2): 0.5 mg via ORAL
  Filled 2016-05-04 (×2): qty 1

## 2016-05-04 MED ORDER — OXYCODONE HCL 5 MG PO TABS
5.0000 mg | ORAL_TABLET | ORAL | Status: DC | PRN
  Administered 2016-05-04 – 2016-05-05 (×4): 5 mg via ORAL
  Filled 2016-05-04 (×5): qty 1

## 2016-05-04 MED ORDER — MAGNESIUM SULFATE 2 GM/50ML IV SOLN
2.0000 g | Freq: Once | INTRAVENOUS | Status: AC
  Administered 2016-05-04: 2 g via INTRAVENOUS
  Filled 2016-05-04: qty 50

## 2016-05-04 MED ORDER — LACTULOSE 10 GM/15ML PO SOLN
20.0000 g | Freq: Three times a day (TID) | ORAL | Status: DC
  Administered 2016-05-04 – 2016-05-05 (×3): 20 g via ORAL
  Filled 2016-05-04 (×3): qty 30

## 2016-05-04 SURGICAL SUPPLY — 14 items

## 2016-05-04 NOTE — Progress Notes (Addendum)
PROGRESS NOTE  Stephen Carney  UEA:[id number] DOB: [birth year] DOA: 05/01/2016 PCP: No PCP Per Patient   Brief Narrative: Trig Mcbryar is a 66 y.o. male with a history of alcohol abuse and cirrhosis of the liver who has had previous esophageal variceal banding (last GI bleed in Oct 2017) who was traveling by bus from home near [place], [place] to a job interview in [place], [place] when he had a syncopal event. EMS was called and on arrival to the ED, he demonstrated frank blood in stool. The patient reported diffuse abdominal pain, chest pain, and dizziness as well as intermittent naproxen use, and nonadherence to lasix, spironolactone and xifaxan.  On arrival, BP 112/77, HR 85, orthostatics negative. Hgb 8.5 > 8.0 and platelets 38 > 32 (unknown baseline of both). Troponin negative. CTA chest was negative for PE but did show features concerning for cirrhosis of the liver. CT abd/pelvis showed decompensated cirrhosis with ascites and fat-containing umbilical hernia. CT head was unremarkable. ECG showed NSR w/ QTc of 479 ms. He was admitted for syncope due to lower GI bleeding. GI was consulted and is planning EGD 3/14 if platelets >50k. Monitor Hgb: 8.5 > 8 > 8.5 > 8 > 7.6 > 1u PRBCs > 8.2.   Assessment & Plan: Principal Problem:   Acute GI bleeding Active Problems:   Acute blood loss anemia   Syncope   Decompensated hepatic cirrhosis (HCC)   Ascites   History of alcoholism (HCC)   Abdominal pain   Acute blood loss anemia due to acute GI bleeding: Dondra Prader in ED now becoming melanotic. Hgb 7.6 > 8.2>8.5 after 1u PRBCs 3/12, plt 31 > 42>44 status post platelet transfusion 3/13, 3/14. Will need plt > 50k prior to EGD. Reschedule for 3/14. CECT abdomen showed decompensated cirrhosis, ascites, abdominal varices, hepatic enteropathy and small fat-containing umbilical hernia.   EGD 3/14,  - Continue octreotide and PPI gtt's - Empiric abx given h/o cirrhosis, on rocephin  Unknown baseline Hg .  Transfuse < 8 with active bleeding. Ferritin low , likely need iron supplementation EGD Impression: - Non-bleeding grade I esophageal varices.                           - Portal hypertensive gastropathy.                           - Normal duodenal bulb, first portion of the duodenum and second portion of the duodenum.                           - No specimens collected.  Syncope: Due to abrupt GI bleeding/anemia. Negative orthostatics after IV fluids in ID. QTc . Echocardiogram showed EF 55-60%, mild LVH, no RWMA's, normal valves.  Continue telemetry, no pauses. Magnesium 1.6 , replete potassium  Cirrhosis of the liver likely secondary to hepatitis C: Likely from alcohol abuse and hepatitis (reported by pt, unknown type). Elevated transaminases with ascites.  - Acute hepatitis panel  positive for HCV antibody, check HCV quantitative - Ammonia 131, started lactulose, recheck ammonia level - Empiric CTX for GI bleed. - Holding lasix, spironolactone  Thrombocytopenia: Presumed chronic related to cirrhosis/splenomegaly.  - Monitor, still below 50k prior to EGD to 3/14, monitor cbc in am  - SCDs  Leukopenia: No evidence of sepsis.  - Check Diff in AM  History of alcoholism: patient states  he  has not had any alcohol since October 2017. +THC on UDS.  - Monitor. No evidence of withdrawal. - On thiamine    DVT prophylaxis: SCDs Code Status: Full Family Communication: None at bedside Disposition Plan: Continue inpatient work up, EGD 3/14, anticipate  DC home 3/15  Consultants:   Gastroenterology.   Procedures:   None  Antimicrobials:  Ceftriaxone 3/11 >>    Subjective: Patient requesting pain medication wants something stronger than morphine IV  Objective: Vitals:   05/03/16 1015 05/03/16 1738 05/03/16 2114 05/04/16 0427  BP: 121/68 121/63 121/72 97/66  Pulse: 90 94 88 87  Resp: 15 16 20 16   Temp: 97.9 F (36.6 C) 97.9 F (36.6 C) 99.4 F (37.4 C) 98.1 F (36.7  C)  TempSrc: Oral Oral    SpO2: 94% 99% 98% 97%  Weight:    108 kg (238 lb 1.6 oz)  Height:        Intake/Output Summary (Last 24 hours) at 05/04/16 0758 Last data filed at 05/04/16 0600  Gross per 24 hour  Intake             1330 ml  Output              575 ml  Net              755 ml   Filed Weights   05/01/16 2047 05/02/16 2200 05/04/16 0427  Weight: 106.4 kg (234 lb 8 oz) 108.9 kg (240 lb) 108 kg (238 lb 1.6 oz)    Examination: General exam: Chronically ill-appearing 66 y.o. male in no distress  Respiratory system: Non-labored breathing ambient air. Clear to auscultation bilaterally.  Cardiovascular system: Regular rate and rhythm. No murmur, rub, or gallop. No JVD, and no pedal edema. Gastrointestinal system: Abdomen soft, mildly distended w/fluid wave. Umbilical hernia reducible with mild tenderness. +BS. No hepatosplenomegaly palpated. Central nervous system: Alert and oriented. No focal neurological deficits. Extremities: Warm, no deformities. Skin: No rashes, lesions ulcers. Diffuse tattoos.  Psychiatry: Judgement and insight appear normal. Mood & affect appropriate.   Data Reviewed: I have personally reviewed following labs and imaging studies  CBC:  Recent Labs Lab 05/01/16 1509 05/01/16 1951  05/02/16 0251 05/02/16 0607 05/02/16 1644 05/03/16 0546 05/03/16 0931  WBC 3.9* 4.3  < > 4.8 3.7* 2.8* 3.3* 3.3*  NEUTROABS 2.7 2.8  --   --   --   --   --   --   HGB 8.7* 8.5*  < > 8.5* 8.0* 7.6* 8.2* 8.2*  HCT 27.5* 26.8*  < > 26.9* 25.4* 24.3* 26.3* 25.9*  MCV 84.4 84.5  < > 83.8 83.8 85.0 85.1 85.2  PLT 32* 38*  < > 39* 32* 31* 42* 41*  < > = values in this interval not displayed. Basic Metabolic Panel:  Recent Labs Lab 05/01/16 1509 05/02/16 0607  NA 137 138  K 3.9 3.7  CL 107 107  CO2 26 27  GLUCOSE 138* 84  BUN 17 15  CREATININE 0.72 0.71  CALCIUM 7.3* 7.1*   GFR: Estimated Creatinine Clearance: 114.4 mL/min (by C-G formula based on SCr of 0.71  mg/dL). Liver Function Tests:  Recent Labs Lab 05/01/16 1509 05/02/16 0607  AST 101* 99*  ALT 69* 67*  ALKPHOS 72 59  BILITOT 1.2 1.4*  PROT 5.3* 5.4*  ALBUMIN 2.6* 2.6*    Recent Labs Lab 05/01/16 1509  LIPASE 69*    Recent Labs Lab 05/03/16 0546  AMMONIA 131*   Coagulation  Profile:  Recent Labs Lab 05/03/16 0546  INR 1.40   Cardiac Enzymes:  Recent Labs Lab 05/01/16 2205  TROPONINI <0.03   BNP (last 3 results) No results for input(s): PROBNP in the last 8760 hours. HbA1C: No results for input(s): HGBA1C in the last 72 hours. CBG:  Recent Labs Lab 05/02/16 2157 05/03/16 1148 05/03/16 1736 05/03/16 2338 05/04/16 0746  GLUCAP 126* 91 151* 102* 96   Lipid Profile: No results for input(s): CHOL, HDL, LDLCALC, TRIG, CHOLHDL, LDLDIRECT in the last 72 hours. Thyroid Function Tests: No results for input(s): TSH, T4TOTAL, FREET4, T3FREE, THYROIDAB in the last 72 hours. Anemia Panel:  Recent Labs  05/03/16 0546  VITAMINB12 1,713*  FOLATE 15.2  FERRITIN 22*  TIBC 293  IRON 161  RETICCTPCT 3.0   Urine analysis: No results found for: COLORURINE, APPEARANCEUR, LABSPEC, PHURINE, GLUCOSEU, HGBUR, BILIRUBINUR, KETONESUR, PROTEINUR, UROBILINOGEN, NITRITE, LEUKOCYTESUR No results found for this or any previous visit (from the past 240 hour(s)).    Radiology Studies: No results found.  Scheduled Meds: . sodium chloride   Intravenous Once  . cefTRIAXone (ROCEPHIN)  IV  2 g Intravenous Q0600  . magnesium sulfate 1 - 4 g bolus IVPB  2 g Intravenous Once  . [START ON 05/05/2016] pantoprazole  40 mg Intravenous Q12H  . thiamine injection  100 mg Intravenous Daily   Continuous Infusions: . octreotide  (SANDOSTATIN)    IV infusion 50 mcg/hr (05/02/16 2125)  . pantoprozole (PROTONIX) infusion 8 mg/hr (05/02/16 2125)     LOS: 3 days   Time spent: 25 minutes.  Richarda Overlie, MD [facility] Pager [phone]  If 7PM-7AM, please contact  night-coverage [email or web address] Password [facility] 05/04/2016, 7:58 AM

## 2016-05-04 NOTE — Op Note (Signed)
[facility] Patient Name: Stephen LairRandolph Frutiger Procedure Date : 05/04/2016 MRN: [id number] Attending MD: Kathi DerParag Rileigh Kawashima , MD Date of Birth: 05/09/1950 CSN: [id number] Age: 66 Admit Type: Inpatient Procedure:                Upper GI endoscopy Indications:              Melena, Cirrhosis with suspected esophageal varices Providers:                Kathi DerParag Nara Paternoster, MD, Dwain SarnaPatricia Ford, RN, Oletha Blendavida                            Shoffner, Technician Referring MD:              Medicines:                General Anesthesia. patient received 2 units of                            platelets transfusion prior to procedure Complications:            No immediate complications. Estimated Blood Loss:     Estimated blood loss: none. Procedure:                Pre-Anesthesia Assessment:                           - Prior to the procedure, a History and Physical                            was performed, and patient medications and                            allergies were reviewed. The patient's tolerance of                            previous anesthesia was also reviewed. The risks                            and benefits of the procedure and the sedation                            options and risks were discussed with the patient.                            All questions were answered, and informed consent                            was obtained. Prior Anticoagulants: The patient has                            taken no previous anticoagulant or antiplatelet                            agents. ASA Grade Assessment: III - A patient with  severe systemic disease. After reviewing the risks                            and benefits, the patient was deemed in                            satisfactory condition to undergo the procedure.                           After obtaining informed consent, the endoscope was                            passed under direct vision. Throughout the                            procedure, the patient's blood pressure, pulse, and                            oxygen saturations were monitored continuously. The                            EG-[id number] ([id number]) scope was introduced through the                            mouth, and advanced to the second part of duodenum.                            The upper GI endoscopy was accomplished without                            difficulty. The patient tolerated the procedure                            well. Scope In: Scope Out: Findings:      Non-bleeding grade I varices were found in the lower third of the       esophagus,. No stigmata of recent bleeding were evident and no red wale       signs were present. Stigmata of prior treatment were evident.      Severe portal hypertensive gastropathy was found in the gastric fundus       and in the gastric body. retroflexion showed no gastric varices.      The duodenal bulb, first portion of the duodenum and second portion of       the duodenum were normal. Impression:               - Non-bleeding grade I esophageal varices.                           - Portal hypertensive gastropathy.                           - Normal duodenal bulb, first portion of the                            duodenum and second  portion of the duodenum.                           - No specimens collected. Moderate Sedation:      none. Recommendation:           - Return patient to hospital ward for ongoing care.                           - Low sodium diet.                           - Give a beta blocker with dosage titrated by the                            heart rate.                           - Return to Primary gastroenterologist in 4 weeks.                           - Continue present medications. Procedure Code(s):        --- Professional ---                           [phone], Esophagogastroduodenoscopy, flexible,                            transoral; diagnostic, including collection  of                            specimen(s) by brushing or washing, when performed                            (separate procedure) Diagnosis Code(s):        --- Professional ---                           K74.60, Unspecified cirrhosis of liver                           I85.10, Secondary esophageal varices without                            bleeding                           K76.6, Portal hypertension                           K31.89, Other diseases of stomach and duodenum                           K92.1, Melena (includes Hematochezia) CPT copyright 2016 American Medical Association. All rights reserved. The codes documented in this report are preliminary and upon coder review may  be revised to meet current compliance requirements. Kathi Der, MD Kathi Der, MD 05/04/2016 12:43:43 PM Number of Addenda: 0

## 2016-05-04 NOTE — Brief Op Note (Signed)
05/01/2016 - 05/04/2016  12:45 PM  PATIENT:  Deloria Lairandolph Cork  66 y.o. male  PRE-OPERATIVE DIAGNOSIS:  GI bleed  POST-OPERATIVE DIAGNOSIS:  portal gastropathy  PROCEDURE:  Procedure(s): ESOPHAGOGASTRODUODENOSCOPY (EGD) WITH PROPOFOL (N/A)  SURGEON:  Surgeon(s) and Role:    * Kathi DerParag Carely Nappier, MD - Primary  Recommendations ------------------------- - EGD showed severe portal gastropathy. No active bleeding. - DC octreotide and IV PPI.  - PO Once a day PPI  - Continue antibiotics while in the hospital. - Start low dose beta blocker. - Follow-up with primary GI in 4-6 weeks - Advance diet to low sodium - GI will sign off . Call us back if needed

## 2016-05-04 NOTE — Anesthesia Preprocedure Evaluation (Addendum)
Anesthesia Evaluation  Patient identified by MRN, date of birth, ID band Patient awake    Reviewed: Allergy & Precautions, H&P , NPO status , Patient's Chart, lab work & pertinent test results  Airway Mallampati: III  TM Distance: >3 FB Neck ROM: Full    Dental no notable dental hx. (+) Partial Lower, Partial Upper, Dental Advisory Given   Pulmonary neg pulmonary ROS, former smoker,    Pulmonary exam normal breath sounds clear to auscultation       Cardiovascular negative cardio ROS   Rhythm:Regular Rate:Normal     Neuro/Psych negative neurological ROS  negative psych ROS   GI/Hepatic negative GI ROS, (+) Cirrhosis   Esophageal Varices  substance abuse  alcohol use,   Endo/Other  negative endocrine ROS  Renal/GU negative Renal ROS  negative genitourinary   Musculoskeletal   Abdominal   Peds  Hematology negative hematology ROS (+) anemia ,   Anesthesia Other Findings   Reproductive/Obstetrics negative OB ROS                            Anesthesia Physical Anesthesia Plan  ASA: III  Anesthesia Plan: General   Post-op Pain Management:    Induction: Intravenous  Airway Management Planned: Oral ETT  Additional Equipment:   Intra-op Plan:   Post-operative Plan: Extubation in OR  Informed Consent: I have reviewed the patients History and Physical, chart, labs and discussed the procedure including the risks, benefits and alternatives for the proposed anesthesia with the patient or authorized representative who has indicated his/her understanding and acceptance.   Dental advisory given  Plan Discussed with: CRNA  Anesthesia Plan Comments:        Anesthesia Quick Evaluation

## 2016-05-04 NOTE — Progress Notes (Addendum)
[facility] Progress Note  Stephen Carney 66 y.o. 06/13/1950  CC:  GI bleed   Subjective:  Patient's bleeding is improving. Having brown stools now. Continues to complain of abdominal distention and abdominal pain. Negative CT scan on admission for any acute changes.  ROS : Positive for bleeding. Negative for nausea and vomiting.   Objective: Vital signs in last 24 hours: Vitals:   05/03/16 2114 05/04/16 0427  BP: 121/72 97/66  Pulse: 88 87  Resp: 20 16  Temp: 99.4 F (37.4 C) 98.1 F (36.7 C)    Physical Exam:  General:  Alert, cooperative, no distress, appears stated age  Head:  Normocephalic, without obvious abnormality, atraumatic  Eyes:  , EOM's intact,   Lungs:   Clear to auscultation bilaterally, respirations unlabored  Heart:  Regular rate and rhythm, S1, S2 normal  Abdomen:   Epigastric discomfort on palpation, periumbilical hernia, bowel sounds active all four quadrants,  no masses,   Extremities: Extremities normal, atraumatic, no  edema  Pulses: 2+ and symmetric    Lab Results:  Recent Labs  05/01/16 1509 05/02/16 0607 05/04/16 0656  NA 137 138  --   K 3.9 3.7  --   CL 107 107  --   CO2 26 27  --   GLUCOSE 138* 84  --   BUN 17 15  --   CREATININE 0.72 0.71  --   CALCIUM 7.3* 7.1*  --   MG  --   --  1.6*    Recent Labs  05/01/16 1509 05/02/16 0607  AST 101* 99*  ALT 69* 67*  ALKPHOS 72 59  BILITOT 1.2 1.4*  PROT 5.3* 5.4*  ALBUMIN 2.6* 2.6*    Recent Labs  05/01/16 1509 05/01/16 1951  05/03/16 0931 05/04/16 0656  WBC 3.9* 4.3  < > 3.3* 3.4*  NEUTROABS 2.7 2.8  --   --   --   HGB 8.7* 8.5*  < > 8.2* 8.2*  HCT 27.5* 26.8*  < > 25.9* 25.4*  MCV 84.4 84.5  < > 85.2 86.4  PLT 32* 38*  < > 41* 40*  < > = values in this interval not displayed.  Recent Labs  05/03/16 0546  LABPROT 17.2*  INR 1.40      Assessment/Plan: - Blood in the stool with some bright blood and dark stools in patient with history of  cirrhosis and esophageal varices. Need to rule out esophageal variceal bleed. - History of esophageal varices per patient in October 2017. 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 Stopped drinking in October 2017. - ThromboCytopenia - AbNormal LFTs. Probably from underlying cirrhosis. - Positive hepatitis C antibody. HCV PCR pending  Recommendations -------------------------- - Patient informed me that he has received 2 units of platelets this morning. Just received call from nursing staff that he has not received platelet transfusion this morning. We will plan for EGD today after platelet transfusion. - HCV  PCR pending. - Continue antibiotics/octreotide/PPI  - Recommend outpatient workup/Managment of cirrhosis  by primary gastroenterologist in [place].   Kathi DerParag Fateh Kindle MD, FACP 05/04/2016, 8:48 AM  Pager [phone]  If no answer or after 5 PM call [phone]

## 2016-05-04 NOTE — Anesthesia Procedure Notes (Signed)
Procedure Name: Intubation Date/Time: 05/04/2016 12:20 PM Performed by: Marena ChancyBECKNER, Lucila Klecka S Pre-anesthesia Checklist: Patient identified, Emergency Drugs available, Suction available and Patient being monitored Patient Re-evaluated:Patient Re-evaluated prior to inductionOxygen Delivery Method: Circle System Utilized Preoxygenation: Pre-oxygenation with 100% oxygen Intubation Type: IV induction Ventilation: Mask ventilation without difficulty Laryngoscope Size: Miller and 2 Grade View: Grade II Tube type: Oral Tube size: 7.5 mm Number of attempts: 1 Airway Equipment and Method: Stylet and Oral airway Placement Confirmation: ETT inserted through vocal cords under direct vision,  positive ETCO2 and breath sounds checked- equal and bilateral Tube secured with: Tape Dental Injury: Teeth and Oropharynx as per pre-operative assessment

## 2016-05-04 NOTE — Progress Notes (Signed)
Pt stated he was anxious and would like something to "calm him down, not pain medicine" , paged Dr. Ellis ParentsAbrol FYI.

## 2016-05-04 NOTE — Anesthesia Postprocedure Evaluation (Signed)
Anesthesia Post Note  Patient: Stephen Carney  Procedure(s) Performed: Procedure(s) (LRB): ESOPHAGOGASTRODUODENOSCOPY (EGD) WITH PROPOFOL (N/A)  Patient location during evaluation: PACU Anesthesia Type: General Level of consciousness: awake and alert Pain management: pain level controlled Vital Signs Assessment: post-procedure vital signs reviewed and stable Respiratory status: spontaneous breathing, nonlabored ventilation and respiratory function stable Cardiovascular status: blood pressure returned to baseline and stable Postop Assessment: no signs of nausea or vomiting Anesthetic complications: no       Last Vitals:  Vitals:   05/04/16 1320 05/04/16 1330  BP: (!) 141/75 137/88  Pulse: 94 96  Resp: 11 15  Temp:      Last Pain:  Vitals:   05/04/16 1254  TempSrc: Oral  PainSc:                  Aariah Godette,W. EDMOND

## 2016-05-04 NOTE — Transfer of Care (Signed)
Immediate Anesthesia Transfer of Care Note  Patient: Deloria LairRandolph Pacetti  Procedure(s) Performed: Procedure(s): ESOPHAGOGASTRODUODENOSCOPY (EGD) WITH PROPOFOL (N/A)  Patient Location: Endoscopy Unit  Anesthesia Type:General  Level of Consciousness: awake, alert  and oriented  Airway & Oxygen Therapy: Patient Spontanous Breathing and Patient connected to nasal cannula oxygen  Post-op Assessment: Report given to RN, Post -op Vital signs reviewed and stable and Patient moving all extremities X 4  Post vital signs: Reviewed and stable  Last Vitals:  Vitals:   05/04/16 1203 05/04/16 1254  BP: 132/89 130/74  Pulse: 85 96  Resp: 15 11  Temp: 36.8 C     Last Pain:  Vitals:   05/04/16 1203  TempSrc: Oral  PainSc:       Patients Stated Pain Goal: 2 (05/03/16 0951)  Complications: No apparent anesthesia complications

## 2016-05-05 LAB — BPAM PLATELET PHERESIS
BLOOD PRODUCT EXPIRATION DATE: 201803162359
Blood Product Expiration Date: 201803152359
Blood Product Expiration Date: 201803152359
ISSUE DATE / TIME: 201803141001
ISSUE DATE / TIME: 201803141133
ISSUE DATE / TIME: 201803141314
Unit Type and Rh: 6200
Unit Type and Rh: 8400
Unit Type and Rh: 8400

## 2016-05-05 LAB — CBC WITH DIFFERENTIAL/PLATELET
BASOS PCT: 1 %
Basophils Absolute: 0 10*3/uL (ref 0.0–0.1)
EOS ABS: 0.6 10*3/uL (ref 0.0–0.7)
EOS PCT: 14 %
HCT: 26.1 % — ABNORMAL LOW (ref 39.0–52.0)
Hemoglobin: 8.2 g/dL — ABNORMAL LOW (ref 13.0–17.0)
Lymphocytes Relative: 21 %
Lymphs Abs: 0.9 10*3/uL (ref 0.7–4.0)
MCH: 27.2 pg (ref 26.0–34.0)
MCHC: 31.4 g/dL (ref 30.0–36.0)
MCV: 86.4 fL (ref 78.0–100.0)
Monocytes Absolute: 0.4 10*3/uL (ref 0.1–1.0)
Monocytes Relative: 10 %
Neutro Abs: 2.3 10*3/uL (ref 1.7–7.7)
Neutrophils Relative %: 55 %
Platelets: 59 10*3/uL — ABNORMAL LOW (ref 150–400)
RBC: 3.02 MIL/uL — ABNORMAL LOW (ref 4.22–5.81)
RDW: 20.1 % — AB (ref 11.5–15.5)
WBC: 4.3 10*3/uL (ref 4.0–10.5)

## 2016-05-05 LAB — COMPREHENSIVE METABOLIC PANEL
ALK PHOS: 57 U/L (ref 38–126)
ALT: 63 U/L (ref 17–63)
ANION GAP: 3 — AB (ref 5–15)
AST: 92 U/L — ABNORMAL HIGH (ref 15–41)
Albumin: 2.7 g/dL — ABNORMAL LOW (ref 3.5–5.0)
BILIRUBIN TOTAL: 1.9 mg/dL — AB (ref 0.3–1.2)
BUN: 9 mg/dL (ref 6–20)
CALCIUM: 6.9 mg/dL — AB (ref 8.9–10.3)
CO2: 28 mmol/L (ref 22–32)
Chloride: 105 mmol/L (ref 101–111)
Creatinine, Ser: 0.76 mg/dL (ref 0.61–1.24)
GFR calc non Af Amer: >60 mL/min (ref >60)
Glucose, Bld: 117 mg/dL — ABNORMAL HIGH (ref 65–99)
Potassium: 3.5 mmol/L (ref 3.5–5.1)
SODIUM: 136 mmol/L (ref 135–145)
TOTAL PROTEIN: 5.3 g/dL — AB (ref 6.5–8.1)

## 2016-05-05 LAB — PREPARE PLATELET PHERESIS
Unit division: 0
Unit division: 0
Unit division: 0

## 2016-05-05 LAB — GLUCOSE, CAPILLARY
GLUCOSE-CAPILLARY: 83 mg/dL (ref 65–99)
GLUCOSE-CAPILLARY: 113 mg/dL — AB (ref 65–99)
Glucose-Capillary: 77 mg/dL (ref 65–99)
Glucose-Capillary: 110 mg/dL — ABNORMAL HIGH (ref 65–99)

## 2016-05-05 MED ORDER — PANTOPRAZOLE SODIUM 40 MG PO TBEC: 40.0000 mg | DELAYED_RELEASE_TABLET | Freq: Every day | ORAL | 1 refills | Status: AC

## 2016-05-05 MED ORDER — LACTULOSE 10 GM/15ML PO SOLN: 20.0000 g | Freq: Three times a day (TID) | ORAL | 1 refills | Status: AC

## 2016-05-05 MED ORDER — PROPRANOLOL HCL 10 MG PO TABS: 10.0000 mg | ORAL_TABLET | Freq: Two times a day (BID) | ORAL | 1 refills | Status: DC

## 2016-05-05 MED ORDER — LACTULOSE 10 GM/15ML PO SOLN: 20.0000 g | Freq: Three times a day (TID) | ORAL | 1 refills | Status: DC

## 2016-05-05 MED ORDER — PROPRANOLOL HCL 10 MG PO TABS: 10.0000 mg | ORAL_TABLET | Freq: Two times a day (BID) | ORAL | 1 refills | Status: AC

## 2016-05-05 MED ORDER — FUROSEMIDE 40 MG PO TABS: 40.0000 mg | ORAL_TABLET | Freq: Every day | ORAL | 2 refills | Status: DC | PRN

## 2016-05-05 MED ORDER — FUROSEMIDE 10 MG/ML IJ SOLN
40.0000 mg | Freq: Once | INTRAMUSCULAR | Status: AC
  Administered 2016-05-05: 40 mg via INTRAVENOUS
  Filled 2016-05-05: qty 4

## 2016-05-05 MED ORDER — VITAMIN B-1 100 MG PO TABS: 100.0000 mg | ORAL_TABLET | Freq: Every day | ORAL | 1 refills | Status: DC

## 2016-05-05 MED ORDER — PANTOPRAZOLE SODIUM 40 MG PO TBEC: 40.0000 mg | DELAYED_RELEASE_TABLET | Freq: Every day | ORAL | 1 refills | Status: DC

## 2016-05-05 MED ORDER — VITAMIN B-1 100 MG PO TABS: 100.0000 mg | ORAL_TABLET | Freq: Every day | ORAL | 1 refills | Status: AC

## 2016-05-05 MED ORDER — FUROSEMIDE 40 MG PO TABS: 40.0000 mg | ORAL_TABLET | Freq: Every day | ORAL | 2 refills | Status: AC | PRN

## 2016-05-05 MED FILL — PANTOPRAZOLE SOD DR 40 MG T: 40 | 30 days supply | Qty: 30 | Fill #0

## 2016-05-05 MED FILL — FUROSEMIDE 40 MG TABLET: 40 | 30 days supply | Qty: 30 | Fill #0

## 2016-05-05 MED FILL — PROPRANOLOL 10 MG TABLET: 10 | 30 days supply | Qty: 60 | Fill #0

## 2016-05-05 MED FILL — GENERLAC 10 GM/15 ML SOLN: 10 | 3 days supply | Qty: 240 | Fill #0

## 2016-05-05 NOTE — Progress Notes (Signed)
Patient ambulated to the nurses station and back.  Patient tolerated well.

## 2016-05-05 NOTE — Care Management Note (Signed)
Case Management Note  Patient Details  Name: Zamire Whitehurst MRN: [id number] Date of Birth: [birth year]  Subjective/Objective:     CM following for progression and d/c planning.                Action/Plan: 05/05/2016 Met with pt re d/c plan. Pt was in route to [employer] for job orientation and training. Has multiple bags and baggage. Has no insurance at this time. Pt anxious about diagnosis and plan of care. This CM has ask Dr Allyson Sabal to discuss with the pt and explain plan to d/c today. Dr Allyson Sabal on the way in to discuss with pt and pt RN also with MD so she can reinforce explanation to the pt as needed in d/c instructions. This CM providing a MATCH letter for pt to be able to fill prescriptions prior to leaving [place].  Discussed with CSW V Sharlet Salina who will provide a taxi voucher as pt has multiple bags and will need to go to the pharmacy as well as get to the bus station.   Expected Discharge Date:  05/05/16               Expected Discharge Plan:  Home/Self Care  In-House Referral:  Clinical Social Work  Discharge planning Services  CM Consult, [place] [facility]  Post Acute Care Choice:  NA Choice offered to:  NA  DME Arranged:   NA DME Agency:   NA  HH Arranged:   NA HH Agency:   NA  Status of Service:  Completed, signed off  If discussed at [employer] of Stay Meetings, dates discussed:    Additional Comments:  Adron Bene, RN 05/05/2016, 11:35 AM

## 2016-05-05 NOTE — Discharge Instructions (Signed)
Follow-up recommendations Follow-up with PCP in 3-5 days , including all  additional recommended appointments as below Follow-up CBC, CMP, INR in 3-5 days Follow-up with primary gastroenterologist in 3-4 weeks

## 2016-05-05 NOTE — Discharge Summary (Addendum)
Physician Discharge Summary  Stephen Carney MRN: [id number] DOB/AGE: [id number] y.o.  PCP: No PCP Per Patient   Admit date: 05/01/2016 Discharge date: 05/05/2016  Discharge Diagnoses:    Principal Problem:   Acute GI bleeding Active Problems:   Acute blood loss anemia   Syncope   Decompensated hepatic cirrhosis (HCC)   Ascites   History of alcoholism ([place])   Abdominal pain    Follow-up recommendations Follow-up with PCP in 3-5 days , including all  additional recommended appointments as below Follow-up CBC, CMP, INR in 3-5 days Follow-up with primary gastroenterologist in 3-4 weeks       Current Discharge Medication List    START taking these medications   Details  lactulose (CHRONULAC) 10 GM/15ML solution Take 30 mLs (20 g total) by mouth 3 (three) times daily. Qty: 240 mL, Refills: 1    pantoprazole (PROTONIX) 40 MG tablet Take 1 tablet (40 mg total) by mouth daily. Qty: 30 tablet, Refills: 1    propranolol (INDERAL) 10 MG tablet Take 1 tablet (10 mg total) by mouth 2 (two) times daily. Qty: 60 tablet, Refills: 1    thiamine (VITAMIN B-1) 100 MG tablet Take 1 tablet (100 mg total) by mouth daily. Qty: 30 tablet, Refills: 1      CONTINUE these medications which have CHANGED   Details  furosemide (LASIX) 40 MG tablet Take 1 tablet (40 mg total) by mouth daily as needed for fluid or edema. Qty: 30 tablet, Refills: 2      CONTINUE these medications which have NOT CHANGED   Details  Ascorbic Acid (VITAMIN C PO) Take 1 tablet by mouth daily.    Cyanocobalamin (VITAMIN B-12 PO) Take 1 tablet by mouth daily.    PRESCRIPTION MEDICATION Inhale 1 puff into the lungs every 6 (six) hours as needed (shortness of breath/wheezing). Unknown inhaler      STOP taking these medications     Naproxen Sod-Diphenhydramine (ALEVE PM PO)          Discharge Condition: Stable   Discharge Instructions Get Medicines reviewed and adjusted: Please take all your  medications with you for your next visit with your Primary MD  Please request your Primary MD to go over all hospital tests and procedure/radiological results at the follow up, please ask your Primary MD to get all Hospital records sent to his/her office.  If you experience worsening of your admission symptoms, develop shortness of breath, life threatening emergency, suicidal or homicidal thoughts you must seek medical attention immediately by calling 911 or calling your MD immediately if symptoms less severe.  You must read complete instructions/literature along with all the possible adverse reactions/side effects for all the Medicines you take and that have been prescribed to you. Take any new Medicines after you have completely understood and accpet all the possible adverse reactions/side effects.   Do not drive when taking Pain medications.   Do not take more than prescribed Pain, Sleep and Anxiety Medications  Special Instructions: If you have smoked or chewed Tobacco in the last 2 yrs please stop smoking, stop any regular Alcohol and or any Recreational drug use.  Wear Seat belts while driving.  Please note  You were cared for by a hospitalist during your hospital stay. Once you are discharged, your primary care physician will handle any further medical issues. Please note that NO REFILLS for any discharge medications will be authorized once you are discharged, as it is imperative that you return to your primary care  physician (or establish a relationship with a primary care physician if you do not have one) for your aftercare needs so that they can reassess your need for medications and monitor your lab values.     No Known Allergies    Disposition: Home   Consults:  GI    Significant Diagnostic Studies:  Ct Abdomen Pelvis Wo Contrast  Result Date: 05/02/2016 CLINICAL DATA:  66 year old male with abdominal pain and known ventral hernia. Patient complaining of shortness  of breath and dizziness. EXAM: CT ABDOMEN AND PELVIS WITHOUT CONTRAST TECHNIQUE: Multidetector CT imaging of the abdomen and pelvis was performed following the standard protocol without IV contrast. COMPARISON:  None. FINDINGS: Evaluation of this exam is limited in the absence of intravenous contrast. Lower chest: The visualized lung bases are clear. There is hypoattenuation of the cardiac blood pool suggestive of a degree of anemia. Clinical correlation is recommended. No intra-abdominal free air.  Small ascites. Hepatobiliary: Morphologic changes of cirrhosis with surface irregularity and caudate lobe hypertrophy. The gallbladder is distended. High attenuating content within the gallbladder likely represents vicarious excretion of intravenous contrast from recent CT study. Small stones or sludge is not excluded. There is diffuse thickened appearance of the gallbladder wall likely related to cirrhosis and portal hypertension. Ultrasound may provide better evaluation of the gallbladder if there is clinical concern for acute cholecystitis. Pancreas: Unremarkable. No pancreatic ductal dilatation or surrounding inflammatory changes. Spleen: Splenomegaly measuring up to 16 cm in the longest dimension. Adrenals/Urinary Tract: The right adrenal gland appears unremarkable. There is focal nodularity of the left adrenal gland which is indeterminate. The kidneys, visualized ureters, and urinary bladder appear unremarkable. Stomach/Bowel: There is diffuse thickened appearance of the colon as well as thickened and edematous appearance of multiple loops of small bowel most likely related to hepatic enteropathy. Enterocolitis is not entirely excluded. Clinical correlation is recommended. There is no evidence of bowel obstruction. Normal appendix. Vascular/Lymphatic: Evaluation of the vasculature is limited in the absence of intravenous contrast. There is moderate aortoiliac atherosclerotic disease. No aneurysmal dilatation. No  portal venous gas identified. Upper abdominal varices including gastroesophageal and perisplenic varices noted. Mildly enlarged aortocaval lymph node measures 14 mm in short axis. Reproductive: The prostate and seminal vesicles are grossly unremarkable. Other: There is a 3 cm peritoneal defect at the level of the umbilicus with herniation of the omental fat. No bowel herniation. There is stranding of the herniated fat with small amount of fluid in the hernia sac likely related to ascites. Strangulated hernia is not entirely excluded. Clinical correlation is recommended. Musculoskeletal: There is multilevel degenerative changes of the spine with endplate irregularity and disc desiccation and vacuum phenomena. No acute fracture. IMPRESSION: 1. Decompensated cirrhosis with evidence of portal hypertension including splenomegaly, ascites, and upper abdominal varices. 2. High attenuating content within the gallbladder likely millicuries excretion of intravenous contrast versus less likely gallbladder sludge and small stones. Diffusely thickened gallbladder wall likely related to cirrhosis and portal hypertension. Ultrasound may provide better evaluation of the gallbladder if there is clinical concern for acute cholecystitis. 3. Thickened and edematous loops of small bowel and colon likely related to hepatic enteropathy. Intra colitis is less likely but not excluded. Clinical correlation is recommended. No bowel obstruction. Normal appendix. 4. Small fat containing umbilical hernia. Stranding of the herniated fat as well as small amount of fluid within the hernia likely extension of ascites. Correlation with clinical exam is recommended to evaluate for incarceration/strangulation of the hernia. Electronically Signed   By:  Anner Crete M.D.   On: 05/02/2016 02:56   Ct Head Wo Contrast  Result Date: 05/01/2016 CLINICAL DATA:  Dizziness. Headache. Back pain. Syncope. Personal history of liver cirrhosis. EXAM: CT HEAD  WITHOUT CONTRAST TECHNIQUE: Contiguous axial images were obtained from the base of the skull through the vertex without intravenous contrast. COMPARISON:  None. FINDINGS: Brain: Mild atrophy and white matter changes are present. Basal ganglia are intact. No acute or focal infarct is evident. Ventricles are of normal size. No significant axial fluid collection is present. Vascular: Atherosclerotic calcifications are present within the cavernous internal carotid arteries bilaterally. There is no hyperdense vessel. Skull: The calvarium is within normal limits. No focal lytic or blastic lesions are present. Sinuses/Orbits: Mild mucosal thickening is present in the maxillary sinuses, left greater than right. Wall thickening suggest chronic disease. The remaining paranasal sinuses and mastoid air cells are clear. A high-riding jugular bulb is present on the right. The globes and orbits are within normal limits. IMPRESSION: 1. Mild atrophy and white matter disease. 2. Atherosclerosis. 3. No acute intracranial abnormality. Electronically Signed   By: San Morelle M.D.   On: 05/01/2016 15:37   Ct Angio Chest Pe W And/or Wo Contrast  Result Date: 05/01/2016 CLINICAL DATA:  Dizziness.  Chest pain shortness of breath. EXAM: CT ANGIOGRAPHY CHEST WITH CONTRAST TECHNIQUE: Multidetector CT imaging of the chest was performed using the standard protocol during bolus administration of intravenous contrast. Multiplanar CT image reconstructions and MIPs were obtained to evaluate the vascular anatomy. CONTRAST:  80 mL Isovue 370 COMPARISON:  None. FINDINGS: Cardiovascular: The heart size is normal. Pulmonary arterial opacification is satisfactory. No focal filling defects are present to suggest pulmonary embolus. Minimal atherosclerotic calcifications are present in the aorta. There is no aneurysm. Great vessel origins are within normal limits. Mediastinum/Nodes: No significant mediastinal adenopathy is present. The esophagus  is within normal limits. Lungs/Pleura: The lungs are clear. No focal nodule, mass, or airspace disease is present. There is no significant pleural effusion. Mild dependent atelectasis is present. Upper Abdomen: Cirrhotic changes are present in the liver. The spleen is enlarged. Stomach is unremarkable. A small amount of free fluid is evident adjacent to the liver. No discrete hepatic lesions are present. Musculoskeletal: More remote present. Vertebral body heights alignment are otherwise normal. Ribs are within normal limits. Bilateral gynecomastia is evident. Review of the MIP images confirms the above findings. IMPRESSION: 1. No pulmonary embolus. 2. Hepatic cirrhosis with mild ascites. 3. Associated splenomegaly. 4. Bilateral gynecomastia. Electronically Signed   By: San Morelle M.D.   On: 05/01/2016 18:34       Filed Weights   05/02/16 2200 05/04/16 0427 05/04/16 2005  Weight: 108.9 kg (240 lb) 108 kg (238 lb 1.6 oz) 110.2 kg (242 lb 14.4 oz)     Microbiology: No results found for this or any previous visit (from the past 240 hour(s)).     Blood Culture No results found for: SDES, [place], CULT, REPTSTATUS    Labs: Results for orders placed or performed during the hospital encounter of 05/01/16 (from the past 48 hour(s))  CBC     Status: Abnormal   Collection Time: 05/03/16  9:31 AM  Result Value Ref Range   WBC 3.3 (L) 4.0 - 10.5 K/uL   RBC 3.04 (L) 4.22 - 5.81 MIL/uL   Hemoglobin 8.2 (L) 13.0 - 17.0 g/dL   HCT 25.9 (L) 39.0 - 52.0 %   MCV 85.2 78.0 - 100.0 fL   MCH  27.0 26.0 - 34.0 pg   MCHC 31.7 30.0 - 36.0 g/dL   RDW 19.5 (H) 11.5 - 15.5 %   Platelets 41 (L) 150 - 400 K/uL    Comment: CONSISTENT WITH PREVIOUS RESULT  Glucose, capillary     Status: None   Collection Time: 05/03/16 11:48 AM  Result Value Ref Range   Glucose-Capillary 91 65 - 99 mg/dL  Glucose, capillary     Status: Abnormal   Collection Time: 05/03/16  5:36 PM  Result Value Ref Range    Glucose-Capillary 151 (H) 65 - 99 mg/dL  Glucose, capillary     Status: Abnormal   Collection Time: 05/03/16 11:38 PM  Result Value Ref Range   Glucose-Capillary 102 (H) 65 - 99 mg/dL  Prepare Pheresed Platelets     Status: None   Collection Time: 05/04/16  6:00 AM  Result Value Ref Range   Unit Number [id number]    Blood Component Type PLTPHER LR2    Unit division 00    Status of Unit REL FROM [facility]    Transfusion Status OK TO TRANSFUSE    Unit Number [id number]    Blood Component Type PLTPHER LRI1    Unit division 00    Status of Unit ISSUED,FINAL    Transfusion Status OK TO TRANSFUSE    Unit Number [id number]    Blood Component Type PLTP LR1 PAS    Unit division 00    Status of Unit ISSUED,FINAL    Transfusion Status OK TO TRANSFUSE   Magnesium     Status: Abnormal   Collection Time: 05/04/16  6:56 AM  Result Value Ref Range   Magnesium 1.6 (L) 1.7 - 2.4 mg/dL  CBC     Status: Abnormal   Collection Time: 05/04/16  6:56 AM  Result Value Ref Range   WBC 3.4 (L) 4.0 - 10.5 K/uL   RBC 2.94 (L) 4.22 - 5.81 MIL/uL   Hemoglobin 8.2 (L) 13.0 - 17.0 g/dL   HCT 25.4 (L) 39.0 - 52.0 %   MCV 86.4 78.0 - 100.0 fL   MCH 27.9 26.0 - 34.0 pg   MCHC 32.3 30.0 - 36.0 g/dL   RDW 19.9 (H) 11.5 - 15.5 %   Platelets 40 (L) 150 - 400 K/uL    Comment: CONSISTENT WITH PREVIOUS RESULT  Glucose, capillary     Status: None   Collection Time: 05/04/16  7:46 AM  Result Value Ref Range   Glucose-Capillary 96 65 - 99 mg/dL  Ammonia     Status: Abnormal   Collection Time: 05/04/16  9:52 AM  Result Value Ref Range   Ammonia 74 (H) 9 - 35 umol/L  CBC     Status: Abnormal   Collection Time: 05/04/16  9:52 AM  Result Value Ref Range   WBC 3.6 (L) 4.0 - 10.5 K/uL    Comment: CONSISTENT WITH PREVIOUS RESULT   RBC 3.15 (L) 4.22 - 5.81 MIL/uL   Hemoglobin 8.5 (L) 13.0 - 17.0 g/dL    Comment: CONSISTENT WITH PREVIOUS RESULT   HCT 27.0 (L) 39.0 - 52.0 %   MCV 85.7 78.0 - 100.0 fL   MCH  27.0 26.0 - 34.0 pg   MCHC 31.5 30.0 - 36.0 g/dL   RDW 19.8 (H) 11.5 - 15.5 %   Platelets 44 (L) 150 - 400 K/uL    Comment: CONSISTENT WITH PREVIOUS RESULT  Glucose, capillary     Status: None   Collection Time: 05/04/16  1:59 PM  Result Value Ref Range   Glucose-Capillary 87 65 - 99 mg/dL   Comment 1 Notify RN   Glucose, capillary     Status: Abnormal   Collection Time: 05/04/16  4:35 PM  Result Value Ref Range   Glucose-Capillary 163 (H) 65 - 99 mg/dL   Comment 1 Notify RN   Glucose, capillary     Status: None   Collection Time: 05/05/16  1:17 AM  Result Value Ref Range   Glucose-Capillary 83 65 - 99 mg/dL  Comprehensive metabolic panel     Status: Abnormal   Collection Time: 05/05/16  5:20 AM  Result Value Ref Range   Sodium 136 135 - 145 mmol/L   Potassium 3.5 3.5 - 5.1 mmol/L   Chloride 105 101 - 111 mmol/L   CO2 28 22 - 32 mmol/L   Glucose, Bld 117 (H) 65 - 99 mg/dL   BUN 9 6 - 20 mg/dL   Creatinine, Ser 0.76 0.61 - 1.24 mg/dL   Calcium 6.9 (L) 8.9 - 10.3 mg/dL   Total Protein 5.3 (L) 6.5 - 8.1 g/dL   Albumin 2.7 (L) 3.5 - 5.0 g/dL   AST 92 (H) 15 - 41 U/L   ALT 63 17 - 63 U/L   Alkaline Phosphatase 57 38 - 126 U/L   Total Bilirubin 1.9 (H) 0.3 - 1.2 mg/dL   GFR calc non Af Amer >60 >60 mL/min   GFR calc Af Amer >60 >60 mL/min    Comment: (NOTE) The eGFR has been calculated using the CKD EPI equation. This calculation has not been validated in all clinical situations. eGFR's persistently <60 mL/min signify possible Chronic Kidney Disease.    Anion gap 3 (L) 5 - 15  CBC with Differential/Platelet     Status: Abnormal   Collection Time: 05/05/16  5:20 AM  Result Value Ref Range   WBC 4.3 4.0 - 10.5 K/uL   RBC 3.02 (L) 4.22 - 5.81 MIL/uL   Hemoglobin 8.2 (L) 13.0 - 17.0 g/dL   HCT 26.1 (L) 39.0 - 52.0 %   MCV 86.4 78.0 - 100.0 fL   MCH 27.2 26.0 - 34.0 pg   MCHC 31.4 30.0 - 36.0 g/dL   RDW 20.1 (H) 11.5 - 15.5 %   Platelets 59 (L) 150 - 400 K/uL    Comment:  CONSISTENT WITH PREVIOUS RESULT   Neutrophils Relative % 55 %   Neutro Abs 2.3 1.7 - 7.7 K/uL   Lymphocytes Relative 21 %   Lymphs Abs 0.9 0.7 - 4.0 K/uL   Monocytes Relative 10 %   Monocytes Absolute 0.4 0.1 - 1.0 K/uL   Eosinophils Relative 14 %   Eosinophils Absolute 0.6 0.0 - 0.7 K/uL   Basophils Relative 1 %   Basophils Absolute 0.0 0.0 - 0.1 K/uL  Glucose, capillary     Status: Abnormal   Collection Time: 05/05/16  6:08 AM  Result Value Ref Range   Glucose-Capillary 113 (H) 65 - 99 mg/dL  Glucose, capillary     Status: None   Collection Time: 05/05/16  8:29 AM  Result Value Ref Range   Glucose-Capillary 77 65 - 99 mg/dL     Lipid Panel  No results found for: CHOL, TRIG, HDL, CHOLHDL, VLDL, LDLCALC, LDLDIRECT     HPI :  Stephen Carney a 54 y.o.malewith a history of alcohol abuse and cirrhosis of the liver who has had previous esophageal variceal banding (last GI bleed in Oct 2017) who was traveling by bus  from home near [place], [place] to a job interview in [place], [place] when he had a syncopal event. EMS was called and on arrival to the ED, he demonstrated frank blood in stool. The patient reported diffuse abdominal pain, chest pain, and dizziness as well as intermittent naproxen use, and nonadherence to lasix, spironolactone and xifaxan.  On arrival, BP 112/77, HR 85, orthostatics negative. Hgb 8.5 > 8.0 and platelets 38 > 32 (unknown baseline of both). Troponin negative. CTA chest was negative for PE but did show features concerning for cirrhosis of the liver. CT abd/pelvis showed decompensated cirrhosis with ascites and fat-containing umbilical hernia. CT head was unremarkable. ECG showed NSR w/ QTc of 479 ms. He was admitted for syncope due to lower GI bleeding. GI was consulted and is planning EGD 3/14 if platelets >50k. Monitor Hgb: 8.5 > 8 > 8.5 > 8 > 7.6 > 1u PRBCs > 8.2.   HOSPITAL COURSE:    Acute blood loss anemia due to acute GI bleeding:  Found to have Loree Fee in ED now becoming melanotic. Patient has had 1 PRBC and 4 units of platelets Hemoglobin 8.2 and platelet count 59 on the day of discharge CT abdomen showed decompensated cirrhosis, ascites, abdominal varices, hepatic enteropathy and small fat-containing umbilical hernia. Initially placed on octreotide and PPI gtt's - Empiric abx given h/o cirrhosis, initiated on Rocephin Ferritin low , likely need iron supplementation in the future EGD 3/14 Impression: - Non-bleeding grade I esophageal varices. - Portal hypertensive gastropathy. - Normal duodenal bulb, first portion of the duodenum and second portion of the duodenum. - No specimens collected. Final GI recommendations EGD showed severe portal gastropathy. No active bleeding. - DC octreotide and IV PPI.  Recommended to continue Protonix once a day Started low-dose propranolol. - Follow-up with primary GI in 4-6 weeks     Syncope: Due to abrupt GI bleeding/anemia. Negative orthostatics after IV fluids in ED.  QTc 470mec on EKG. Echocardiogram showed EF 55-60%, mild LVH, no RWMA's, normal valves.  Continue telemetry, no pauses. Magnesium 1.6 , replete potassium and magnesium   Cirrhosis of the liver likely secondary to hepatitis C: Likely from alcohol abuse and hepatitis C (reported by pt, unknown type). Elevated transaminases with ascites.  - Acute hepatitis panel  positive for HCV antibody,   HCV quantitative pending at the time of discharge - Ammonia 131, started lactulose,  Continue Lasix, add Aldactone if blood pressure tolerates  Thrombocytopenia: Presumed chronic related to cirrhosis/splenomegaly.   Status post 4 units of platelets  Leukopenia: No evidence of sepsis.     History of alcoholism: patient states  he has not had any alcohol since October 2017. +THC on UDS.  - Monitor. No evidence of withdrawal. - On thiamine, folic  acid   Discharge Exam:  Blood pressure 125/63, pulse 88, temperature 98.7 F (37.1 C), temperature source Oral, resp. rate 19, height 5' 9"  (1.753 m), weight 110.2 kg (242 lb 14.4 oz), SpO2 99 %.  General exam: Chronically ill-appearing 66y.o. male in no distress  Respiratory system: Non-labored breathing ambient air. Clear to auscultation bilaterally.  Cardiovascular system: Regular rate and rhythm. No murmur, rub, or gallop. No JVD, and no pedal edema. Gastrointestinal system: Abdomen soft, mildly distended w/fluid wave. Umbilical hernia reducible with mild tenderness. +BS. No hepatosplenomegaly palpated. Central nervous system: Alert and oriented. No focal neurological deficits. Extremities: Warm, no deformities. Skin: No rashes, lesions ulcers. Diffuse tattoos.  Psychiatry: Judgement and insight appear normal. Mood & affect  appropriate.     Follow-up Information    Primary care provider. Call.   Why:  Hospital follow-up in 3-5 days, please request your primary care provider to refer you to a GI specialist for cirrhosis of the liver          Signed: Jerrick Farve 05/05/2016, 8:53 AM        Time spent >45 mins

## 2016-05-05 NOTE — Clinical Social Work Note (Signed)
Patient medically stable for discharge and cab transport requested. CSW provided cab vouchers for patient to get medications [facility] and then be [place] [employer] [facility] [place].  Genelle BalVanessa Ernestyne Caldwell, MSW, LCSW Licensed Clinical Social Worker Clinical Social Work Department [facility] [phone]

## 2016-05-05 NOTE — Progress Notes (Signed)
Discharge instructions and medications discussed with patient.  Prescriptions, AVS, and match letter give to patient.  All questions answered.

## 2016-05-07 LAB — HCV RNA QUANT RFLX ULTRA OR GENOTYP
HCV RNA QNT(LOG COPY/ML): 6.54 {Log_IU}/mL
HepC Qn: 3470000 IU/mL

## 2016-05-07 LAB — HEPATITIS C GENOTYPE

## 2018-06-15 IMAGING — CT CT ABD-PELV W/O CM
2 of 4 series · 9 of 46 positions shown, 10 images · non-contrast
Comparison: None.

CLINICAL DATA: 65-year-old male with abdominal pain and known
ventral hernia. Patient complaining of shortness of breath and
dizziness.

EXAM:
CT ABDOMEN AND PELVIS WITHOUT CONTRAST
TECHNIQUE: Multidetector CT imaging of the abdomen and pelvis was performed
following the standard protocol without IV contrast.

[Series 201: routine, idose (2) · axial · 0.78mm/px · z∈[+121,+511]mm · 6 of 94 slices shown, 7 images]
[im 8/94  soft-tissue]
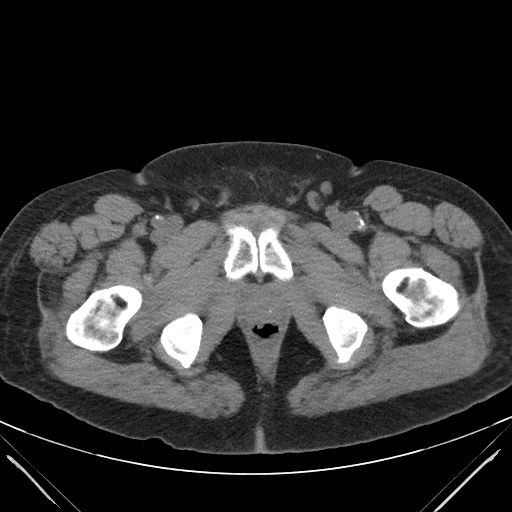
[im 8/94  bone]
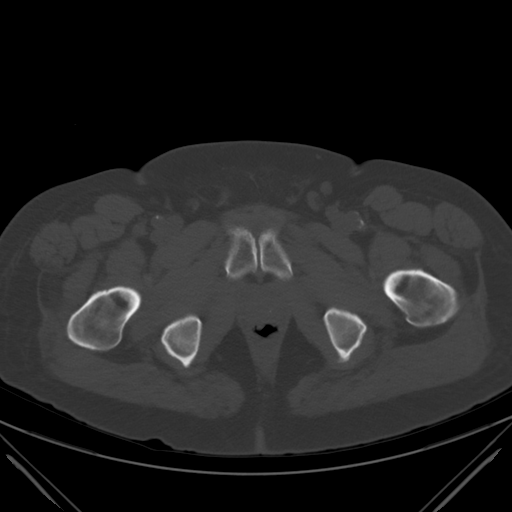
[im 24/94  soft-tissue]
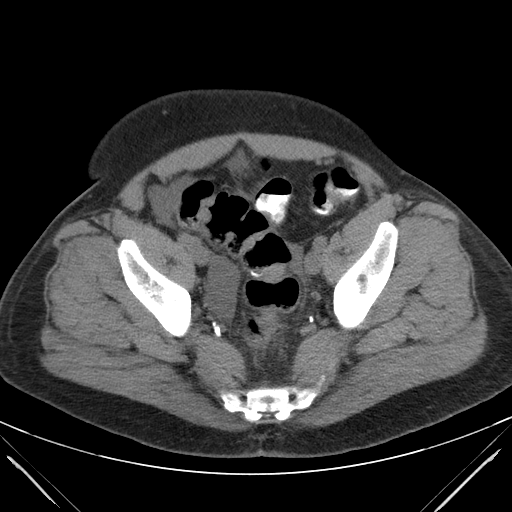
[im 39/94  soft-tissue]
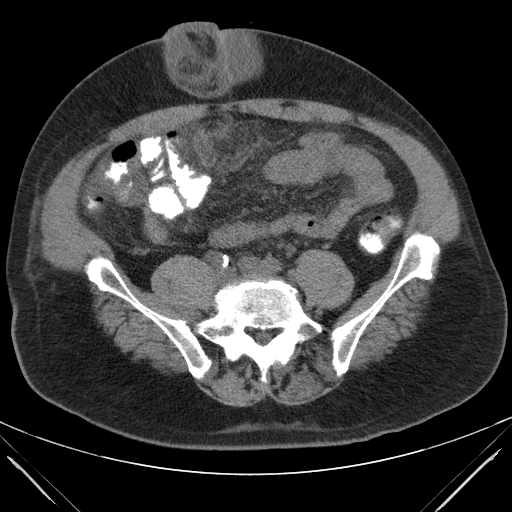
[im 55/94  soft-tissue]
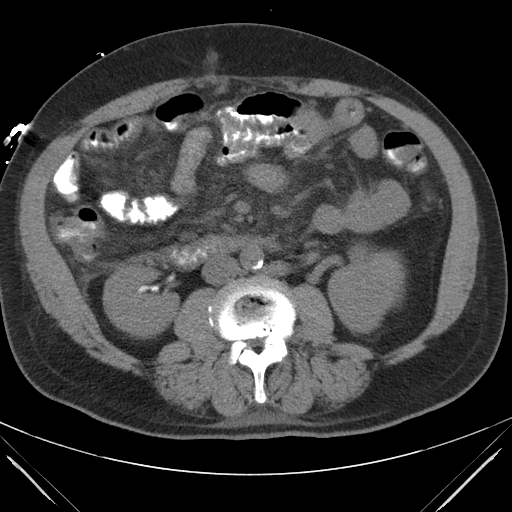
[im 70/94  soft-tissue]
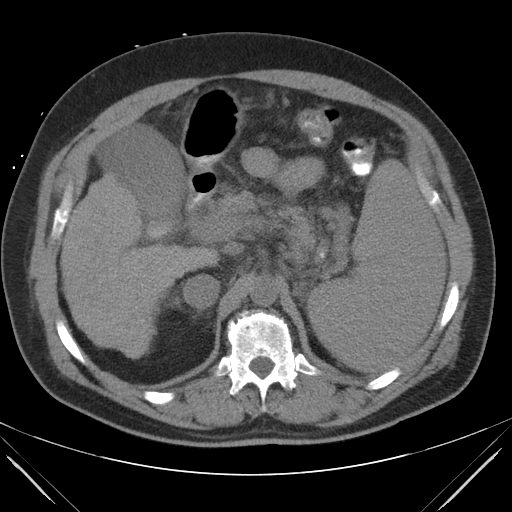
[im 86/94  soft-tissue]
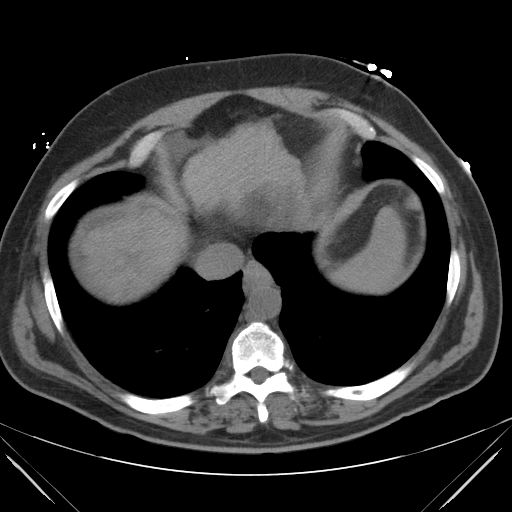

[Series 203: coronals, idose (2) · coronal · 0.45mm/px · 3 of 142 slices shown]
[im 48/142  soft-tissue]
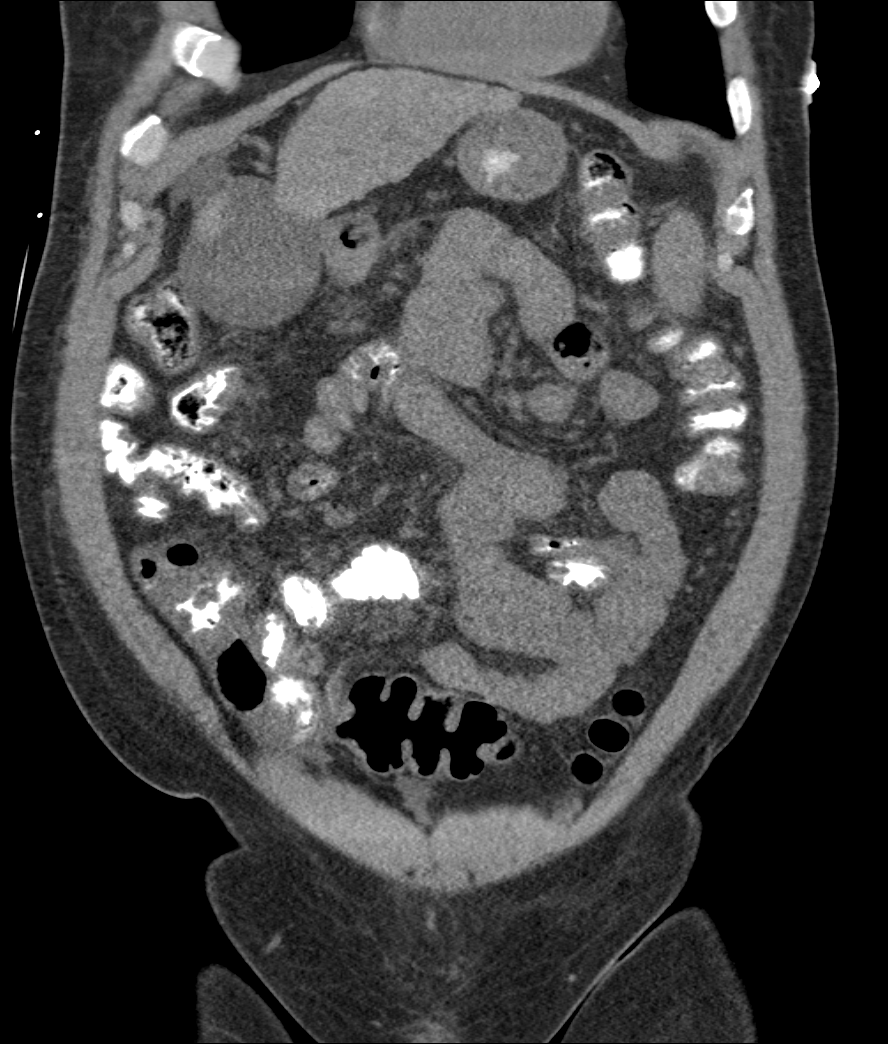
[im 63/142  soft-tissue]
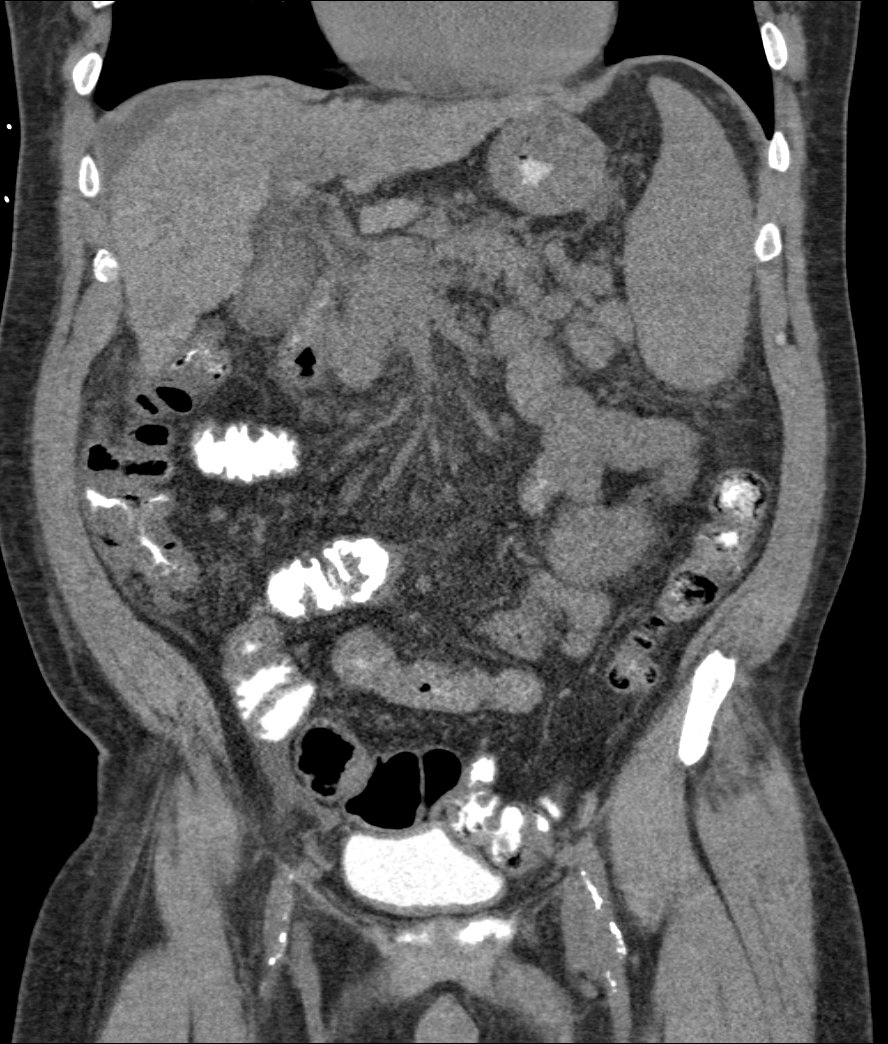
[im 79/142  soft-tissue]
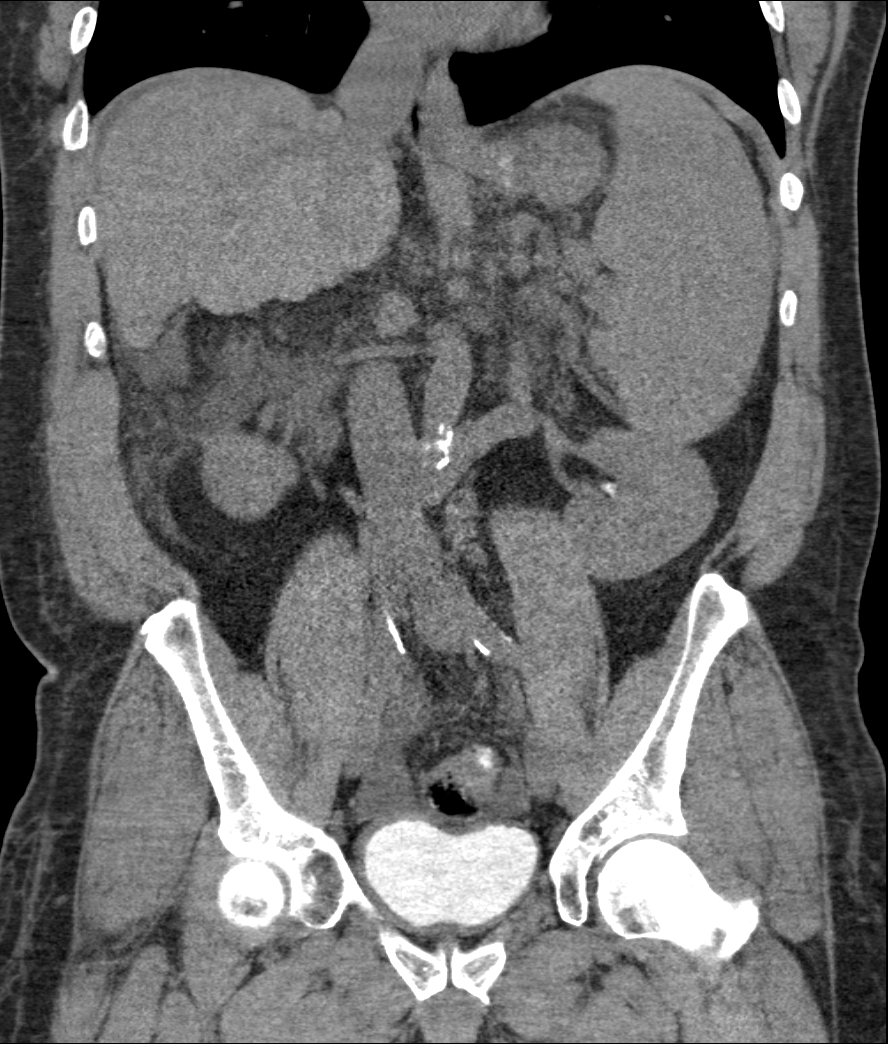

[9 of 46 positions shown; findings below may reference images not displayed]

FINDINGS: Evaluation of this exam is limited in the absence of intravenous
contrast.

Lower chest: The visualized lung bases are clear. There is
hypoattenuation of the cardiac blood pool suggestive of a degree of
anemia. Clinical correlation is recommended.

No intra-abdominal free air.  Small ascites.

Hepatobiliary: Morphologic changes of cirrhosis with surface
irregularity and caudate lobe hypertrophy. The gallbladder is
distended. High attenuating content within the gallbladder likely
represents vicarious excretion of intravenous contrast from recent
CT study. Small stones or sludge is not excluded. There is diffuse
thickened appearance of the gallbladder wall likely related to
cirrhosis and portal hypertension. Ultrasound may provide better
evaluation of the gallbladder if there is clinical concern for acute
cholecystitis.

Pancreas: Unremarkable. No pancreatic ductal dilatation or
surrounding inflammatory changes.

Spleen: Splenomegaly measuring up to 16 cm in the longest dimension.

Adrenals/Urinary Tract: The right adrenal gland appears
unremarkable. There is focal nodularity of the left adrenal gland
which is indeterminate. The kidneys, visualized ureters, and urinary
bladder appear unremarkable.

Stomach/Bowel: There is diffuse thickened appearance of the colon as
well as thickened and edematous appearance of multiple loops of
small bowel most likely related to hepatic enteropathy.
Enterocolitis is not entirely excluded. Clinical correlation is
recommended. There is no evidence of bowel obstruction. Normal
appendix.

Vascular/Lymphatic: Evaluation of the vasculature is limited in the
absence of intravenous contrast. There is moderate aortoiliac
atherosclerotic disease. No aneurysmal dilatation. No portal venous
gas identified. Upper abdominal varices including gastroesophageal
and perisplenic varices noted. Mildly enlarged aortocaval lymph node
measures 14 mm in short axis.

Reproductive: The prostate and seminal vesicles are grossly
unremarkable.

Other: There is a 3 cm peritoneal defect at the level of the
umbilicus with herniation of the omental fat. No bowel herniation.
There is stranding of the herniated fat with small amount of fluid
in the hernia sac likely related to ascites. Strangulated hernia is
not entirely excluded. Clinical correlation is recommended.

Musculoskeletal: There is multilevel degenerative changes of the
spine with endplate irregularity and disc desiccation and vacuum
phenomena. No acute fracture.
IMPRESSION: 1. Decompensated cirrhosis with evidence of portal hypertension
including splenomegaly, ascites, and upper abdominal varices.
2. High attenuating content within the gallbladder likely
millicuries excretion of intravenous contrast versus less likely
gallbladder sludge and small stones. Diffusely thickened gallbladder
wall likely related to cirrhosis and portal hypertension. Ultrasound
may provide better evaluation of the gallbladder if there is
clinical concern for acute cholecystitis.
3. Thickened and edematous loops of small bowel and colon likely
related to hepatic enteropathy. Intra colitis is less likely but not
excluded. Clinical correlation is recommended. No bowel obstruction.
Normal appendix.
4. Small fat containing umbilical hernia. Stranding of the herniated
fat as well as small amount of fluid within the hernia likely
extension of ascites. Correlation with clinical exam is recommended
to evaluate for incarceration/strangulation of the hernia.
